# Patient Record
Sex: Female | Born: 1935 | Race: White | Hispanic: No | Marital: Married | Smoking: Former smoker
Health system: Southern US, Community
[De-identification: ages and names within clinical notes are randomized; demographics above are authoritative.]

## PROBLEM LIST (undated history)

## (undated) DIAGNOSIS — I4891 Unspecified atrial fibrillation: Secondary | ICD-10-CM

## (undated) DIAGNOSIS — M109 Gout, unspecified: Secondary | ICD-10-CM

## (undated) DIAGNOSIS — I5032 Chronic diastolic (congestive) heart failure: Secondary | ICD-10-CM

## (undated) DIAGNOSIS — I050 Rheumatic mitral stenosis: Secondary | ICD-10-CM

## (undated) DIAGNOSIS — I10 Essential (primary) hypertension: Secondary | ICD-10-CM

## (undated) DIAGNOSIS — R911 Solitary pulmonary nodule: Secondary | ICD-10-CM

## (undated) DIAGNOSIS — J90 Pleural effusion, not elsewhere classified: Secondary | ICD-10-CM

## (undated) DIAGNOSIS — E785 Hyperlipidemia, unspecified: Secondary | ICD-10-CM

## (undated) DIAGNOSIS — J449 Chronic obstructive pulmonary disease, unspecified: Secondary | ICD-10-CM

## (undated) DIAGNOSIS — J189 Pneumonia, unspecified organism: Secondary | ICD-10-CM

## (undated) HISTORY — PX: ABDOMINAL HYSTERECTOMY: SHX81

## (undated) HISTORY — PX: APPENDECTOMY: SHX54

## (undated) HISTORY — DX: Unspecified atrial fibrillation: I48.91

## (undated) HISTORY — DX: Pleural effusion, not elsewhere classified: J90

## (undated) HISTORY — DX: Chronic diastolic (congestive) heart failure: I50.32

## (undated) HISTORY — PX: CHOLECYSTECTOMY: SHX55

## (undated) HISTORY — DX: Essential (primary) hypertension: I10

## (undated) HISTORY — DX: Rheumatic mitral stenosis: I05.0

## (undated) HISTORY — DX: Solitary pulmonary nodule: R91.1

---

## 2002-08-14 ENCOUNTER — Inpatient Hospital Stay (HOSPITAL_COMMUNITY): Admission: RE | Admit: 2002-08-14 | Discharge: 2002-08-15 | Payer: Self-pay | Admitting: Cardiology

## 2002-08-15 ENCOUNTER — Encounter: Payer: Self-pay | Admitting: Cardiology

## 2003-02-28 ENCOUNTER — Inpatient Hospital Stay (HOSPITAL_COMMUNITY): Admission: RE | Admit: 2003-02-28 | Discharge: 2003-03-03 | Payer: Self-pay | Admitting: *Deleted

## 2003-02-28 ENCOUNTER — Encounter (INDEPENDENT_AMBULATORY_CARE_PROVIDER_SITE_OTHER): Payer: Self-pay

## 2003-02-28 ENCOUNTER — Encounter: Payer: Self-pay | Admitting: *Deleted

## 2004-03-12 ENCOUNTER — Ambulatory Visit (HOSPITAL_COMMUNITY): Admission: RE | Admit: 2004-03-12 | Discharge: 2004-03-12 | Payer: Self-pay | Admitting: Occupational Therapy

## 2005-09-03 ENCOUNTER — Emergency Department (HOSPITAL_COMMUNITY): Admission: EM | Admit: 2005-09-03 | Discharge: 2005-09-03 | Payer: Self-pay | Admitting: Emergency Medicine

## 2006-11-11 ENCOUNTER — Ambulatory Visit (HOSPITAL_COMMUNITY): Admission: RE | Admit: 2006-11-11 | Discharge: 2006-11-11 | Payer: Self-pay | Admitting: Nurse Practitioner

## 2008-02-07 ENCOUNTER — Ambulatory Visit (HOSPITAL_COMMUNITY): Admission: RE | Admit: 2008-02-07 | Discharge: 2008-02-07 | Payer: Self-pay | Admitting: Nurse Practitioner

## 2008-10-19 ENCOUNTER — Emergency Department (HOSPITAL_COMMUNITY): Admission: EM | Admit: 2008-10-19 | Discharge: 2008-10-19 | Payer: Self-pay | Admitting: Emergency Medicine

## 2009-03-15 ENCOUNTER — Inpatient Hospital Stay (HOSPITAL_COMMUNITY): Admission: EM | Admit: 2009-03-15 | Discharge: 2009-03-18 | Payer: Self-pay | Admitting: Emergency Medicine

## 2009-06-10 ENCOUNTER — Ambulatory Visit (HOSPITAL_COMMUNITY): Admission: RE | Admit: 2009-06-10 | Discharge: 2009-06-10 | Payer: Self-pay | Admitting: Nurse Practitioner

## 2010-08-12 ENCOUNTER — Ambulatory Visit (HOSPITAL_COMMUNITY): Admission: RE | Admit: 2010-08-12 | Discharge: 2010-08-12 | Payer: Self-pay | Admitting: Nurse Practitioner

## 2010-08-22 ENCOUNTER — Encounter: Admission: RE | Admit: 2010-08-22 | Discharge: 2010-08-22 | Payer: Self-pay | Admitting: Nurse Practitioner

## 2010-09-05 ENCOUNTER — Ambulatory Visit (HOSPITAL_COMMUNITY): Admission: RE | Admit: 2010-09-05 | Discharge: 2010-09-05 | Payer: Self-pay | Admitting: Occupational Therapy

## 2010-11-29 ENCOUNTER — Encounter: Payer: Self-pay | Admitting: Nurse Practitioner

## 2011-02-09 ENCOUNTER — Other Ambulatory Visit (HOSPITAL_COMMUNITY): Payer: Self-pay | Admitting: Nurse Practitioner

## 2011-02-09 DIAGNOSIS — Z09 Encounter for follow-up examination after completed treatment for conditions other than malignant neoplasm: Secondary | ICD-10-CM

## 2011-02-17 LAB — URINE MICROSCOPIC-ADD ON

## 2011-02-17 LAB — DIFFERENTIAL
Basophils Absolute: 0 10*3/uL (ref 0.0–0.1)
Basophils Relative: 0 % (ref 0–1)
Eosinophils Absolute: 0 10*3/uL (ref 0.0–0.7)
Eosinophils Absolute: 0.1 10*3/uL (ref 0.0–0.7)
Eosinophils Relative: 0 % (ref 0–5)
Eosinophils Relative: 1 % (ref 0–5)
Lymphocytes Relative: 15 % (ref 12–46)
Lymphocytes Relative: 26 % (ref 12–46)
Lymphs Abs: 1.2 10*3/uL (ref 0.7–4.0)
Lymphs Abs: 1.3 10*3/uL (ref 0.7–4.0)
Lymphs Abs: 1.7 10*3/uL (ref 0.7–4.0)
Lymphs Abs: 2 10*3/uL (ref 0.7–4.0)
Monocytes Absolute: 0.4 10*3/uL (ref 0.1–1.0)
Monocytes Absolute: 0.4 10*3/uL (ref 0.1–1.0)
Monocytes Absolute: 0.5 10*3/uL (ref 0.1–1.0)
Monocytes Absolute: 0.6 10*3/uL (ref 0.1–1.0)
Monocytes Relative: 4 % (ref 3–12)
Monocytes Relative: 5 % (ref 3–12)
Monocytes Relative: 6 % (ref 3–12)
Neutro Abs: 6.3 10*3/uL (ref 1.7–7.7)
Neutro Abs: 6.6 10*3/uL (ref 1.7–7.7)
Neutrophils Relative %: 71 % (ref 43–77)
Neutrophils Relative %: 80 % — ABNORMAL HIGH (ref 43–77)
Neutrophils Relative %: 86 % — ABNORMAL HIGH (ref 43–77)

## 2011-02-17 LAB — URINALYSIS, ROUTINE W REFLEX MICROSCOPIC
Bilirubin Urine: NEGATIVE
Glucose, UA: NEGATIVE mg/dL
Hgb urine dipstick: NEGATIVE
Ketones, ur: NEGATIVE mg/dL
Protein, ur: 100 mg/dL — AB
Urobilinogen, UA: 0.2 mg/dL (ref 0.0–1.0)

## 2011-02-17 LAB — COMPREHENSIVE METABOLIC PANEL
ALT: 129 U/L — ABNORMAL HIGH (ref 0–35)
Albumin: 2.4 g/dL — ABNORMAL LOW (ref 3.5–5.2)
Albumin: 2.5 g/dL — ABNORMAL LOW (ref 3.5–5.2)
BUN: 43 mg/dL — ABNORMAL HIGH (ref 6–23)
BUN: 45 mg/dL — ABNORMAL HIGH (ref 6–23)
Calcium: 8.4 mg/dL (ref 8.4–10.5)
Calcium: 8.6 mg/dL (ref 8.4–10.5)
Creatinine, Ser: 1.38 mg/dL — ABNORMAL HIGH (ref 0.4–1.2)
Glucose, Bld: 105 mg/dL — ABNORMAL HIGH (ref 70–99)
Potassium: 4.1 mEq/L (ref 3.5–5.1)
Potassium: 4.2 mEq/L (ref 3.5–5.1)
Sodium: 136 mEq/L (ref 135–145)
Total Protein: 5.5 g/dL — ABNORMAL LOW (ref 6.0–8.3)
Total Protein: 5.8 g/dL — ABNORMAL LOW (ref 6.0–8.3)

## 2011-02-17 LAB — CBC
HCT: 38.5 % (ref 36.0–46.0)
HCT: 41.6 % (ref 36.0–46.0)
Hemoglobin: 13.5 g/dL (ref 12.0–15.0)
Hemoglobin: 13.6 g/dL (ref 12.0–15.0)
MCHC: 34.3 g/dL (ref 30.0–36.0)
MCHC: 35.2 g/dL (ref 30.0–36.0)
MCV: 94.7 fL (ref 78.0–100.0)
MCV: 95.3 fL (ref 78.0–100.0)
Platelets: 123 10*3/uL — ABNORMAL LOW (ref 150–400)
Platelets: 149 10*3/uL — ABNORMAL LOW (ref 150–400)
Platelets: 173 10*3/uL (ref 150–400)
RBC: 3.79 MIL/uL — ABNORMAL LOW (ref 3.87–5.11)
RBC: 4.39 MIL/uL (ref 3.87–5.11)
RDW: 13.5 % (ref 11.5–15.5)
RDW: 13.6 % (ref 11.5–15.5)
WBC: 12.9 10*3/uL — ABNORMAL HIGH (ref 4.0–10.5)
WBC: 8.2 10*3/uL (ref 4.0–10.5)

## 2011-02-17 LAB — PHOSPHORUS: Phosphorus: 3.3 mg/dL (ref 2.3–4.6)

## 2011-02-17 LAB — BASIC METABOLIC PANEL
BUN: 32 mg/dL — ABNORMAL HIGH (ref 6–23)
CO2: 24 mEq/L (ref 19–32)
Chloride: 101 mEq/L (ref 96–112)
Chloride: 106 mEq/L (ref 96–112)
Creatinine, Ser: 1.27 mg/dL — ABNORMAL HIGH (ref 0.4–1.2)
GFR calc Af Amer: 50 mL/min — ABNORMAL LOW (ref >60)
Glucose, Bld: 108 mg/dL — ABNORMAL HIGH (ref 70–99)
Potassium: 4 mEq/L (ref 3.5–5.1)
Potassium: 4.1 mEq/L (ref 3.5–5.1)
Sodium: 138 mEq/L (ref 135–145)

## 2011-02-17 LAB — PROTIME-INR: Prothrombin Time: 14.8 seconds (ref 11.6–15.2)

## 2011-02-17 LAB — URINE CULTURE
Colony Count: NO GROWTH
Culture: NO GROWTH
Special Requests: NEGATIVE

## 2011-02-17 LAB — APTT: aPTT: 32 seconds (ref 24–37)

## 2011-02-25 ENCOUNTER — Ambulatory Visit (HOSPITAL_COMMUNITY)
Admission: RE | Admit: 2011-02-25 | Discharge: 2011-02-25 | Disposition: A | Discharge status: Home / Self Care | Payer: Medicare FFS | Source: Ambulatory Visit | Attending: Nurse Practitioner | Admitting: Nurse Practitioner

## 2011-02-25 ENCOUNTER — Ambulatory Visit (HOSPITAL_COMMUNITY): Payer: Medicare FFS

## 2011-02-25 DIAGNOSIS — Z09 Encounter for follow-up examination after completed treatment for conditions other than malignant neoplasm: Secondary | ICD-10-CM

## 2011-02-25 DIAGNOSIS — R928 Other abnormal and inconclusive findings on diagnostic imaging of breast: Secondary | ICD-10-CM | POA: Insufficient documentation

## 2011-03-24 NOTE — Group Therapy Note (Signed)
NAME:  Paula Fletcher, Paula Fletcher NO.:  [id number]   MEDICAL RECORD NO.:  [id number]          PATIENT TYPE:  INP   LOCATION:  A338                          FACILITY:  [facility]   PHYSICIAN:  Dorris Singh, DO    DATE OF BIRTH:  08-10-1935   DATE OF PROCEDURE:  03/17/2009  DATE OF DISCHARGE:                                 PROGRESS NOTE   SUBJECTIVE:  I saw the patient today in the room with family members,  looking at her arm.  It looks unchanged.  Makes me really suspect some  type of solar dermatitis.  She did mention that she had been working out  in [employer] and that she is right-hand dominant. I will go ahead  and continue antibiotics for a total of three days.  Will also start her  on some steroids and some cold compresses to the limb, to see if that  helps.  I talked to the family too about getting Dr. Jorja Loa  involved with her new renal failure that was not listed from 2004.  Will  go ahead and get a renal ultrasound as well, to see what is going on.   PHYSICAL EXAMINATION:  VITAL SIGNS:  Her temperature is 98.2, pulse 64,  respirations 18, blood pressure 123/54.  GENERAL:  She is well-  developed, well-nourished  and in no acute distress.  HEART:  Regular rate and rhythm.  LUNGS:  Clear to auscultation bilaterally.  ABDOMEN:  Soft, nontender, nondistended.  EXTREMITIES:  Her right hand has a thickened red, tender to touch rash,  on the exterior forearm with a small cut near that area as well.   IMPRESSION/PLAN:  1. Cellulitis, versus solar dermatitis:  Will go ahead and gives      steroids and continue the antibiotic therapy, to see if this helps,      as well as cold compresses.  2. Renal failure, unsure if this is acute on chronic, or acute renal      failure:  Will continue with IV hydration.  She is responding      slowly, so it makes me wonder if there is some element of some      chronic failure.  Also her creatinine has gone up since she  has      been hospitalized as well, so will have Dr. Kristian Covey evaluate this.   The plan is to have the patient discharged in the next one or two days.      Dorris Singh, DO  Electronically Signed     CB/MEDQ  D:  03/17/2009  T:  03/17/2009  Job:  [id number]

## 2011-03-24 NOTE — Discharge Summary (Signed)
Paula Fletcher, Paula Fletcher NO.:  [id number]   MEDICAL RECORD NO.:  [id number]          PATIENT TYPE:  INP   LOCATION:  A338                          FACILITY:  [facility]   PHYSICIAN:  Lonia Blood, M.D.      DATE OF BIRTH:  1935-07-17   DATE OF ADMISSION:  03/15/2009  DATE OF DISCHARGE:  05/10/2010LH                               DISCHARGE SUMMARY   PRIMARY CARE PHYSICIAN:  [facility].   DISCHARGE DIAGNOSES:  1. Cellulitis, right forearm.  2. Increased LFTs transiently probably due to statins.  3. Transient thrombocytopenia.  4. Dyslipidemia.  5. Hypertension.  6. Coronary artery disease.  7. Acute renal failure now resolved.   DISCHARGE MEDICATIONS:  1. Crestor 20 mg daily.  2. Nifedipine 90 mg daily.  3. Metoprolol 50 mg twice a day.  4. Darvocet-N 100 q.6 h. as needed.  5. Lasix 20 mg p.o. daily.  6. Benicar HCT 40/25 1 tablet daily.  7. The patient will be on antibiotics, to be specific, doxycycline 100      mg p.o. b.i.d.   DISPOSITION:  The patient is doing much better today.  She has had some  issues with increased renal function but that has completely resolved.  Her lesions are likely to be a combination of both tick bite and  cellulitis plus possibly some sunburn.   PROCEDURES PERFORMED:  Renal ultrasound performed Mar 16, 2009 that  showed duplicated left renal collecting system but no evidence for  hydronephrosis.  Otherwise no acute findings.   CONSULTATIONS:  None.   BRIEF HISTORY AND PHYSICAL:  Please refer to dictated history and  physical on admission by Dr. Dorris Singh.  In short, however, this  is a 76 year old female with known history of dyslipidemia and  hypertension that came in with right arm pain, swelling and redness.  She also had some nausea and vomiting, tick bite and headache.  The  patient was seen and evaluated and admitted for further management.   HOSPITAL COURSE:  1. Cellulitis.  With history of tick  bite, the patient was on IV      antibiotics.  She had some daily dressings and some wound care.  It      looks like her lesions were a little bit  resistant, not resolving      quickly enough.  At this point, however, she is improved.  No      fever, no white count, so I will discharge her on antibiotics.      especially in the setting of tick bite.  2. Acute renal failure.  Her BUN and creatinine were elevated on      admission.  Creatinine was 1.78.  It looks like this is prerenal.      Renal ultrasound was fine and after hydration today her creatinine      is 0.99.  3. Dyslipidemia.  The patient has been on Crestor and continued on her      Crestor in the hospital.  4. Mild increase in LFTs.  This probably is coming from her use of  statin.  5. Hypertension.  Blood pressure was well-controlled on her home      regimen.  6. Coronary artery disease.  Again, this is stable and no problems      seen.  Other than that, the patient is stable and we are ready to      discharge her.      Lonia Blood, M.D.  Electronically Signed     LG/MEDQ  D:  03/18/2009  T:  03/18/2009  Job:  [id number]

## 2011-03-24 NOTE — H&P (Signed)
NAME:  Paula Fletcher, Paula Fletcher NO.:  [id number]   MEDICAL RECORD NO.:  [id number]          PATIENT TYPE:  INP   LOCATION:  A338                          FACILITY:  [facility]   PHYSICIAN:  Dorris Singh, DO    DATE OF BIRTH:  22-Sep-1935   DATE OF ADMISSION:  03/15/2009  DATE OF DISCHARGE:  LH                              HISTORY & PHYSICAL   The patient is a 76 year old Caucasian female who presented to the [facility] Emergency Room after her daughter noticed the patient had a rash on  her arm.  The patient has been complaining of several symptoms that  include headache as well and the patient also admits to being bit by a  tick about 2 weeks ago and she pulled it off.  The rash started an  unknown time ago.  The daughter has  questioned the mom who said she  never noticed it.  However, the arm is tender to the touch.  At that  point in time due to her possible exposure to Lyme disease and the  cellulitis of her arm, it was determined that she should be admitted at  least for one or two days to receive IV antibiotics.   PAST MEDICAL HISTORY:  She has a history of hypertension,  hyperlipidemia, CAD.   SOCIAL HISTORY:  She is nonsmoker, nondrinker.   ALLERGIES:  She has no known drug allergies.   CURRENT LIST OF MEDICATIONS INCLUDE:  1. Crestor 20 mg once a day.  2. Nifedipine 90 mg once a day.  3. Metoprolol tartrate 50 mg twice a day.  4. Darvocet 100 mg /650 as needed.  5. Lasix 20 mg once a day.  6. Benicar 40/25 once a day.   REVIEW OF SYSTEMS:  CONSTITUTIONAL:  Negative for fever and chills.  EYES:  Negative for eye pain.  EARS, NOSE, MOUTH, THROAT:  Currently  negative for ear pain.  CARDIOVASCULAR:  Negative for chest pain.  RESPIRATORY:  Negative for cough, dyspnea or wheezing.  GASTROINTESTINAL:  Negative for  nausea, vomiting, diarrhea.  GU:  Negative for dysuria or flank pain.  MUSCULOSKELETAL:  Negative for  arthralgias, back pain or neck pain.  SKIN:   Positive for rash and  redness.  NEURO:  Positive for headache.   PHYSICAL EXAMINATION:  CURRENT VITAL SIGNS:  Temperature 99, pulse 70,  respirations 14, blood pressure 94/45.  GENERAL:  The patient is well-developed, well-nourished 76 year old  Caucasian female who is in no acute distress.  Head is normocephalic, atraumatic.  Eyes are PERLA and EOMI.  Positive  glasses.  Ears, nose, mouth and throat are within normal limits.  Teeth  are in fair repair.  NECK:  Supple.  No stool.  There is no lymphadenopathy.  HEART:  Regular rate and rhythm.  LUNGS:  Clear to auscultation bilaterally.  ABDOMEN:  Soft, nontender, nontender.  EXTREMITIES:  Positive pulses.  No edema, ecchymosis or cyanosis.   CURRENT LABORATORY DATA:  White count 12.9, hemoglobin 14.6, hematocrit  41.6, platelet count 152.  Her sodium is 137, potassium 4.0, chloride  101, CO2  24, glucose 135, BUN 29,  creatinine 1.27.  Her UA shows a few  bacteria with some casts and small amount of leukocytes with some  protein.  The patient was last admitted to this hospital last seen in  December 2009 when she was here for an open fracture. We will go ahead  and get an ultrasound. I am  not sure of this renal function is new or  not.  This to a decrease in her renal function.   ASSESSMENT/PLAN:  1. Cellulitis the right forearm.  2. Nausea and vomiting.  3. Tick bite.  4. Renal insufficiency.  5. Headache.   She will be admitted to  [facility] hospitalist Team P.  Will  do  blood work in the morning, do IV hydration, get a renal ultrasound.  Get  a bedside commode.  Put her on a heart healthy diet.  Do DVT and GI  prophylaxis.  Will keep her on doxycycline 100 mg p.o. b.i.d. and the  Rocephin 1000 mg IV q. 24.  She is having some hypotension at the  moment.  We will withhold any of her blood pressure medications for  blood pressure less than 100.  We will continue to monitor her and make  any changes as necessary.  I  anticipate discharge planning within the  next 1-2 days. If  patient  is improved,  I suspect she can be  discharged by Sunday.      Dorris Singh, DO  Electronically Signed     CB/MEDQ  D:  03/15/2009  T:  03/15/2009  Job:  [id number]

## 2011-03-27 NOTE — Discharge Summary (Signed)
NAME:  Paula Fletcher, Paula Fletcher                   ACCOUNT NO.:  [id number]   MEDICAL RECORD NO.:  [id number]                   PATIENT TYPE:  INP   LOCATION:  2017                                 FACILITY:  [facility]   PHYSICIAN:  Sausal Bing, MD [facility]             DATE OF BIRTH:  04/19/1935   DATE OF ADMISSION:  08/14/2002  DATE OF DISCHARGE:  08/15/2002                           DISCHARGE SUMMARY - REFERRING   PROCEDURE:  1. Carotid Dopplers.  2. Adenosine Cardiolite.  3. A 2-D echocardiogram.   HOSPITAL COURSE:  The patient is a 76 year old female with no known history  of coronary artery disease.  She was seen in the office on August 14, 2002  for mid sternal chest pain that radiated into her back and was associated  with diaphoresis and nausea.  It lasted greater than one hour.  There was  concern for an anginal component to her symptoms so she was admitted to the  hospital to rule out MI and for further evaluation.   Her enzymes were negative for MI and she had a Cardiolite.  A stress  Cardiolite was ordered but on the treadmill she had significant lower  extremity pain and was unable to continue.  The test was aborted because her  heart rate was 108 and her goal heart rate was 131.  She had no chest pain  and the shortness of breath was not the limiting factor.  An Adenosine  Cardiolite was substituted.  With the Adenosine Cardiolite she had a few  PVCs and some nonconducted PACs, but no significant heart block and a flush  feeling, but no chest pain or shortness of breath.  The Cardiolite results  showed an EF of 86% with no wall motion abnormalities and no evidence of  scar or ischemia.  No further ischemic work-up is indicated at this time.   The patient had an elevated EF of 86% by Cardiolite and her echocardiogram  additionally showed an elevated EF at 80%.  There was concern for  hypertrophic obstructive cardiomyopathy but this diagnosis was not made at  the time.  Her  medications were changed in that she was taken off Procardia  and placed on Lopressor.  It was felt that this was a better medication for  her.  In the future she may need to be tapered off diuretics with other  medications for blood pressure instead.  No further medication changes were  felt necessary at this time.  Office follow-up is to be within the next two  weeks.   Because of the type and quality of her symptoms, there was a concern for a  GI component.  A GI consult was called and the patient was seen by Dr.  Leone Payor.  It was felt that she would, indeed, benefit from a trial of a pro  time pump inhibitor and outpatient follow-up with him.  He did not feel that  an EGD was indicated at this  time.  He did, however, recommend a screening  colonoscopy as an outpatient with a family history of probable colon cancer.  A lipase level was ordered, but is pending at the time of dictation.  No  further inpatient work-up was recommended.   With the resolution of her chest pain and with Cardiolite negative for  ischemia or scar, she was considered stable for discharge on August 15, 2002.   LABORATORIES:  Total cholesterol 167, triglycerides 209, HDL 42, LDL 83.  TSH 1.436.  Serial CK-MB and troponin I negative for MI.  Sodium 135,  potassium 4.3, chloride 99, CO2 28, BUN 22, creatinine 0.9, glucose 100.  Other CMET values within normal limits.  Hemoglobin 15.4, hematocrit 43.7,  WBC 5.3, platelets 199,000.  INR 1.0 with PTT 27.   Chest x-ray:  Mild chronic obstructive pulmonary disease with fibrosis, but  no active findings.   The 2-D echocardiogram:  Left ventricular EF was estimated 80% with no  diagnostic evidence of left ventricular regional wall motion abnormalities.  There was mild mitral annular calcification and there was vigorous left  ventricular motion.  There is a small mid cavitary gradient document.  I do  not see any systolic anterior motion of the mitral valve.    CONDITION ON DISCHARGE:  Stable.   DISCHARGE DIAGNOSES:  1. Chest pain, negative myocardial infarction by enzymes and no scar or     ischemia by Cardiolite with vigorous ejection fraction.  2. Possible gastroesophageal reflux disease symptoms.  Outpatient follow-up     recommended.  3. Lower extremity pain with ambulation.  Follow up with primary care     physician.  4. Hypertension.  5. Atherosclerotic peripheral vascular disease with 40-60% internal carotid     artery stenosis bilaterally with retrograde vertebral arterial flow.  6. History of tobacco use.  7. Status post cholecystectomy, breast cyst removal, and total vaginal     hysterectomy.  8. History of Raynaud's disease.  9. Hyperlipidemia.  10.      History of hiatal hernia.  11.      Chronic obstructive pulmonary disease by chest x-ray.  12.      Elevated ejection fraction by echocardiogram with small     intracavitary gradient.   DISCHARGE INSTRUCTIONS:  Her activity level is to be as tolerated.  She is  to stick to a low fat diet.  She is not to use tobacco.  She is to follow up  with [facility].  She is to follow up with Dr. Leone Payor.  She is to follow up with Dr. Dietrich Pates and call for an appointment.   DISCHARGE MEDICATIONS:  1. __________  b.i.d.  2. Avacor 1000/20 q.d.  3. Tussionex p.r.n.  4. Enalapril/HCT 10/25 q.d.  5. HCTZ 25 mg q.d.  6. Albuterol MDI b.i.d.  7.     Lopressor 25 mg p.o. b.i.d.  8. Aspirin 325 mg q.d.  9. Prilosec 20 mg q.d. or b.i.d. p.r.n.     Lavella Hammock, P.A. [facility]                  Martin Bing, MD [facility]    RG/MEDQ  D:  08/15/2002  T:  08/16/2002  Job:  [id number]   cc:   Iva Boop, M.D. [facility]  [facility]  [address] [place], [place] [address]  Fax: 1   La Carla Bing, M.D. [facility]  [address]  [place]  [place] [address]  Fax: 1  [facility]

## 2011-03-27 NOTE — H&P (Signed)
NAME:  Paula Fletcher, Paula Fletcher                         ACCOUNT NO.:  [id number]   MEDICAL RECORD NO.:  [id number]                   PATIENT TYPE:  INP   LOCATION:  NA                                   FACILITY:  [facility]   PHYSICIAN:  Balinda Quails, M.D.                 DATE OF BIRTH:  1934/12/20   DATE OF ADMISSION:  02/28/2003  DATE OF DISCHARGE:                                HISTORY & PHYSICAL   CHIEF COMPLAINT:  Left leg pain.   HISTORY OF PRESENT ILLNESS:  The patient is a 76 year old white female with  a one year history of lower extremity claudication symptoms.  Specifically  she has been having mostly left leg pain which occurs with exertion.  She  states she only has to walk short distances before she develops cramping in  her left calf.  She also reports left buttock pain and hip pain as well.  She has cramps in both feet which awaken her from sleep at night as well as  numbness and tingling in both lower extremities.  She has also noticed that  her left foot is cooler than the right.  She denies any nonhealing ulcers.  Her symptoms are progressively worsening, therefore she saw Dr. Pablo Ledger in [place] for further evaluation.  Ankle brachial indexes  performed in his office on the left were 0.31 at the toe and 0.74 at the  calf.  Arteriography was also performed which showed occlusion of the left  common iliac artery.  The left external iliac filled via internal iliac  collaterals.  The right common and external iliacs were patent and both  right and left SFA's were widely patent.  It was felt that she would require  surgical revascularization, therefore she was referred to Dr. Liliane Bade for  evaluation.  Because of her symptoms and her significant peripheral vascular  disease it was felt that she would benefit from a right to left femoral to  femoral bypass.  The risks, benefits and alternatives were explained to the  patient and she has agreed to proceed at this  time.   PAST MEDICAL HISTORY:  1. Peripheral vascular disease.  2. Hypertension.  3. Hyperlipidemia.  4. She reports a questionable history of ovarian cancer for which she had a     hysterectomy.  It is unclear whether this was indeed the reason for her     surgery and she has received no further treatment or follow up for this     problem.  5. Patient also has a questionable left subclavian stenosis which she states     is inoperable.  Again, I can find nothing to confirm this in her previous     records.   PAST SURGICAL HISTORY:  1. Cholecystectomy 25 years ago.  2. Bilateral breast cysts removed, all of which were benign.  3. Vaginal hysterectomy.  4. Appendectomy.  CURRENT MEDICATIONS:  1. Nifedipine 30 mg daily.  2. Hydrochlorothiazide 25 mg daily.  3. Enalapril/hydrochlorothiazide 10/25 mg daily.  4. Metoprolol 25 mg b.i.d.  5. Advicor 1000/20 daily.  6. Enteric coated aspirin 325 mg daily.   ALLERGIES:  No known drug allergies.   FAMILY HISTORY:  Her mother died at age 72 of coronary artery disease.  Her  father died at age 55 of cancer of unknown type.  He also had coronary  artery disease.  She denies family history of hypertension, diabetes  mellitus, chronic obstructive pulmonary disease, cerebrovascular accident or  peripheral vascular disease.   SOCIAL HISTORY:  The patient is married and has two children.  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  She denies alcohol use.  She has smoked at least one pack of  cigarettes per day, if not more, for at least 20 years.   PHYSICAL EXAMINATION:  VITAL SIGNS:  Blood pressure 138/62 on right, pulse  84 and regular, respirations 18 and unlabored.  GENERAL:  This is a well-developed, well-nourished white female in no acute  distress.  HEENT:  Normocephalic, atraumatic.  Pupils equal, round, reactive to light  and accommodation.  Extraocular movements intact.   Tympanic membranes and  canals are clear. Nares patent bilaterally.  Oropharynx is clear with upper  and lower dentures in place.  NECK:  Supple without lymphadenopathy or thyromegaly.  There are soft  bilateral carotid bruits.  LUNGS:  Clear to auscultation.  HEART:  Regular rate and rhythm without murmurs, rubs or gallops.  ABDOMEN:  Soft, nontender, nondistended with active bowel sounds in all  quadrants.  No masses or hepatosplenomegaly.  EXTREMITIES:  No cyanosis, clubbing or edema.  Her feet are cool to touch.  She has a 2+ palpable femoral pulse on the right with no palpable pulse in  the femoral position on the left.  Her dorsalis pedis pulse and posterior  tibial pulses are 1+ on the right and absent but Dopplerable on the left.  NEUROLOGICAL:  Cranial nerves II-XII grossly intact. She is alert and  oriented X3.  Gait is steady and only limited by discomfort.   ASSESSMENT/PLAN:  This is a 76 year old white female with history of  peripheral vascular disease who will be admitted to [facility] on February 28, 2003 for a right to left femoral to femoral bypass  graft performed by Dr. Liliane Bade.     Coral Ceo, P.A.                        Balinda Quails, M.D.    GC/MEDQ  D:  02/26/2003  T:  02/26/2003  Job:  [id number]   cc:   Jarold Motto, M.D.  [facility]   Pablo Ledger, M.D.  [facility]

## 2011-03-27 NOTE — Op Note (Signed)
NAME:  COZETTE, BRAGGS                         ACCOUNT NO.:  [id number]   MEDICAL RECORD NO.:  [id number]                   PATIENT TYPE:  INP   LOCATION:  3314                                 FACILITY:  [facility]   PHYSICIAN:  Balinda Quails, M.D.                 DATE OF BIRTH:  Jan 04, 1935   DATE OF PROCEDURE:  02/28/2003  DATE OF DISCHARGE:                                 OPERATIVE REPORT   PREOPERATIVE DIAGNOSIS:  Left lower extremity claudication.   POSTOPERATIVE DIAGNOSIS:  Left lower extremity claudication.   OPERATION:  1. Right to left femoral femoral bypass with 7 mm Dacron Hemashield graft.  2. Right common femoral endarterectomy.  3. Patch angioplasty right superficial femoral artery.   SURGEON:  Balinda Quails, M.D.   ASSISTANT:  Quita Skye. Hart Rochester, M.D./Willard D. Marlyne Beards, P.A.   ANESTHESIA:  General endotracheal.   CLINICAL NOTE:  The patient is a 76 year old obese female with history of  leg pain with ambulation.  She was referred by Dr. Park Liter from  [facility] with an arteriogram revealing left iliac occlusion.  She was seen  in consultation and it was recommended she undergo right to left femoral  femoral bypass for release of claudication symptoms.  She consented for  surgery.  The risks of this operative procedure including the potential  risks of bleeding, infection, MRI, limb loss and death were discussed.   DESCRIPTION OF PROCEDURE:  The patient was brought to the operating room in  stable condition.  Placed in the supine position.  General endotracheal  anesthesia induced.  Both legs were prepped and draped in a sterile fashion.  Bilateral oblique groin skin incisions were made at the inguinal crease.  Dissection was carried down through the subcutaneous tissue with  electrocautery.  Large veins were ligated with 2-0 and 3-0 silk and divided.  Common femoral artery was exposed bilaterally at the inguinal ligament and  encircled with a vessel loupe.   Distal dissection carried down to the origin  of the profunda and superficial femoral arteries which were each encircled  with vessel loupes.   The right common femoral artery revealed an excellent pulse.  There was;  however, plaque in the mid right common femoral artery extending out into  the origin of the profunda and superificial femoral arteries.  The left  common femoral artery revealed a very weak pulse.  The artery; however, was  generally soft and free of significant plague.   A suprapubic subcutaneous tunnel was made between the two incisions.  A 7 mm  Dacron graft was placed between the two incisions.   The patient administered 7000 units of heparin intravenously.  Adequate  circulation time permitted.  The right femoral vessel was controlled with  clamps.  A longitudinal arteriotomy made in the right common femoral artery.  The arteriotomy extended down into the origin of the right superficial  femoral  artery.  There was a large posterior plaque extending from the mid  common femoral artery down into the superficial femoral artery.  This was  dentatectomized.  Proximally the plague was divided transversely.  Distally  the plaque was feathered out at superficial femoral artery.  There appeared  to be a good end point distally.  The 7 mm Dacron graft was then anastomosed  end-to-side with right common femoral artery using running 6-0 Prolene  suture.  At the completion of this, the femoral vessels were flushed and the  graft controlled with a fistula clamp.   Attention then placed on the left groin.  The left femoral vessel was  controlled with clamps.  A longitudinal arteriotomy made in the distal left  common femoral artery.  The left limb of the graft was beveled and  anastomosed end to side to the left common femoral artery using running 6-0  Prolene suture.  The graft then flushed and clamped removed reperfusing the  left leg.   The patient received a test dose of  Protamine.  Following this, evaluation  of the right leg; however, revealed questionable flow in the proximal right  superficial femoral artery.  No further Protamine was administered and the  patient received an additional 3000 units of heparin.  The right superficial  femoral artery was then controlled proximally to distally with clamps.  A  longitudinal arteriotomy made.  There was a small intimal flap present.  This was resected and then distally the plaque was tacked down with  interrupted 7-0 Prolene suture.  A patch angioplasty of the proximal right  superficial femoral artery was carried out with a Hemashield patch using  running 6-0 Prolene suture.  Clamps were then removed and excellent flow  present through the right leg.   The patient was then administered an additional 50 mg of Protamine  intravenously.  The groin wound was irrigated with antibiotic solution.  Adequate hemostasis obtained.   Both incisions were then closed with two layers of running 2-0 Vicryl suture  for the subcutaneous tissue and staples applied to the skin.  Sterile  dressings were applied.   The patient was transferred to the recovery room in stable condition.                                               Balinda Quails, M.D.    PGH/MEDQ  D:  02/28/2003  T:  03/01/2003  Job:  [id number]   cc:   Park Liter, M.D.  [employer], [place].

## 2011-03-27 NOTE — Consult Note (Signed)
   NAME:  Paula Fletcher, Paula Fletcher                   ACCOUNT NO.:  [id number]   MEDICAL RECORD NO.:  [id number]                   PATIENT TYPE:  INP   LOCATION:  2017                                 FACILITY:  [facility]   PHYSICIAN:  Iva Boop, M.D. [facility]           DATE OF BIRTH:  [birth year]   DATE OF CONSULTATION:  08/15/2002  DATE OF DISCHARGE:                                   CONSULTATION   I have seen this patient in conjunction with Dr. Shary Decamp of the  medicine residency program at [facility]. [facility]  I have  personally taken history and performed physical examination and agree with  the notes that he has recorded in the chart.   ASSESSMENT:  1. Chest and epigastric pain that has responded to H2 blocker in the past.     At this point, cardiac work-up appears negative, laboratory studies are     unrevealing.  Question nonulcer dyspepsia versus peptic ulcer disease     versus gastroesophageal reflux disease.  The patient is asymptomatic at     the present time.  2. Possible family history of colon cancer. The patient has never had colon     cancer screening.   PLAN:  1. Trial of Prilosec 20 mg q.d. to b.i.d. over the next four weeks.  2. Return office visit with me in four weeks.  3. Will discuss the possibility of screening colonoscopy at that time and     try to clarify the colon cancer history at that time.                                               Iva Boop, M.D. [facility]    CEG/MEDQ  D:  08/15/2002  T:  08/17/2002  Job:  [id number]   cc:   [place] Bing, M.D. [facility]  [address]  [place]  [place] [address]  Fax: 1

## 2011-03-27 NOTE — H&P (Signed)
NAME:  Paula Fletcher, Paula Fletcher                   ACCOUNT NO.:  [id number]   MEDICAL RECORD NO.:  [id number]                   PATIENT TYPE:  INP   LOCATION:  2017                                 FACILITY:  [facility]   PHYSICIAN:  [place] Bing, MD [facility]             DATE OF BIRTH:  02/09/1935   DATE OF ADMISSION:  08/14/2002  DATE OF DISCHARGE:  08/15/2002                                HISTORY & PHYSICAL   REFERRED BY:  [facility].   HISTORY OF PRESENT ILLNESS:  This is a 76 year old woman with chest pain of  uncertain duration referred for evaluation of a severe episode last night.  The patient is somewhat of a vague historian.  She initially described a 1-  day history of chest pain -- her doctor says this has been going on for some  time.  She relates lower right chest discomfort that radiates through to her  back.  She has had some radiation down the right arm.  There is associated  dyspnea.  There is no diaphoresis.  She has had some nausea and near-emesis.  The onset last night was at rest, but her daughter indicates that episodes  frequently occur with exertion.  There is an uncertain relationship to  eating.  Last night, the patient describes the sudden onset of right-sided  discomfort that became severe with radiation through to the right back.  She  tried some ointments without much relief.  She eventually took aspirin and  noted marked eructation with gradual improvement.  She reports some mild  residual soreness at the right costal margin and in her right back.  There  was no localized tenderness.  There was no exacerbation of symptoms with  movement of the trunk.   The patient has multiple cardiovascular risk factors including a 45-pack-  year history of cigarette smoking, hypertension, and hyperlipidemia.  She  has been receiving a good medical regimen, which includes nifedipine 30 mg  q.d., Advicor 1 g/20 mg q.d., Tussionex, enalapril HCT 10/25 mg q.d.  and  additional 25 mg of hydrochlorothiazide q.d., albuterol by MDI, aspirin, and  Advil.   PAST MEDICAL HISTORY:  Past medical history is notable for a remote  cholecystectomy.  She has had excisional biopsies for benign disease of both  breasts.  She has had a total vaginal hysterectomy due to uterine cancer  many years ago.  There is a history of Raynaud's phenomenon.   ALLERGIES:  None known.   SOCIAL HISTORY:  No excessive use of alcohol; works in Audiological [employer].  Married  with 2 children who are alive and well.  Her husband is a patient of our  practice who has previously had a defibrillator implanted.   REVIEW OF SYSTEMS:  Notable for occasional dizziness, the need for  corrective lenses, upper and lower dentures, intermittent palpations,  chronic cough with a history of COPD, cerebrovascular disease -- a carotid  duplex study demonstrated atherosclerosis without focal  disease.  She has no  GI history other than her cholecystectomy.  She has nocturia once or twice  per night.   PHYSICAL EXAMINATION:  On exam, a well-appearing woman.  VITAL SIGNS:  Weight:  187.  Blood pressure 110/60 on the right and 80/palp  on the left -- blood pressure disparity has been described in the past.  Heart rate:  84 and regular.  Respirations:  16.  NECK:  No jugular venous distension; left carotid bruit.  ENDOCRINE:  No thyromegaly.  HEMATOPOIETIC:  No adenopathy.  SKIN:  No significant lesions.  HEENT:  Anicteric sclerae.  CHEST:  Clear to auscultation; resonance to percussion.  CARDIAC:  Normal first and second heart sounds; fourth heart sound present.  ABDOMEN:  Soft and nontender; no bruits; no organomegaly; right upper  quadrant surgical scar.  EXTREMITIES:  1+ distal pulses; trace edema.  NEUROMUSCULAR:  Symmetric strength and tone.   IMPRESSION:  This is a 76 year old woman with chest discomfort that may be  somewhat chronic, but with acute exacerbation within the past 12 hours.  By   history, this certainly could be of GI origin rather than cardiac, but it is  impossible to differentiate this in the office.  Accordingly, the patient is  being admitted to [facility] for further testing.  Serial cardiac  markers to be obtained.  She will be started on low-dose beta blocker with  nifedipine discontinued.  If initial testing is negative, a stress  Cardiolite study will be performed in the morning.  GI consultation to be  obtained.                                               Beaver Bing, MD [facility]    RR/MEDQ  D:  08/14/2002  T:  08/16/2002  Job:  [id number]

## 2011-03-27 NOTE — Discharge Summary (Signed)
NAME:  Paula Fletcher, Paula Fletcher                         ACCOUNT NO.:  [id number]   MEDICAL RECORD NO.:  [id number]                   PATIENT TYPE:  INP   LOCATION:  2008                                 FACILITY:  [facility]   PHYSICIAN:  Balinda Quails, M.D.                 DATE OF BIRTH:  07/04/1935   DATE OF ADMISSION:  02/28/2003  DATE OF DISCHARGE:  03/03/2003                                 DISCHARGE SUMMARY   DISCHARGE DIAGNOSES:  1. Left lower extremity claudication.  2. Occlusion of the left common iliac artery.   SECONDARY DIAGNOSES:  1. Hypertension.  2. Hyperlipidemia.  3. Questionable history of ovarian cancer, status post hysterectomy.  4. Status post cholecystectomy 25 years ago.  5. Bilateral breast cyst resections, all benign.  6. Status post appendectomy.   PROCEDURE:  1. February 28, 2003, right-to-left femoral-to-femoral bypass, Dr. Balinda Quails.  The patient tolerated the procedure well and was transferred     stable to recovery room, had good perfusion in left lower extremity     throughout her hospitalization.  2. March 01, 2003, ankle brachial indices, on the left 0.90, on the right     0.90.   DISCHARGE DISPOSITION:  The patient is ready for discharge, postoperative  day #3, after undergoing right-to-left femoral-to-femoral bypass.  She has  been afebrile in the postoperative period.  Her mental status has remained  fair.  Her incisions are healing nicely.  She is ambulatory independently.  Her pain is controlled with oral analgesics.  She has not required  consultation other than a smoking cessation consult; brochure was given and  the patient opts for cold-turkey cessation rather than either nicotine  substitute or Zyban.   DISCHARGE MEDICATIONS:  The patient will go home on the following  medications:  1. Oxycodone 5 mg one to two tabs every four to six hours p.r.n. pain.  2. Nifedipine 30 mg a day.  3. Hydrochlorothiazide 25 mg daily.  4.  Enalapril/hydrochlorothiazide 10/25 mg daily.  5. Metoprolol 25 mg p.o. b.i.d.  6. Advicor 20/1000 mg daily.  7. Enteric-coated aspirin daily.  8. Combivent metered-dose inhaler two puffs every six hours.   DISCHARGE ACTIVITY:  Discharge activity is to walk daily to keep up her  strength.  She is asked not to drive for two weeks.   DISCHARGE DIET:  Low-sodium, low-cholesterol diet.   WOUND CARE:  She may shower, but if this is not possible at her house, she  is to continue sponge-bathing, especially the groins every morning, pat dry,  place dry gauze over the incisions and tape; she will be given these  dressings on discharge.  She is to call Dr. Florina Ou office if the incision  becomes red or starts to break apart.   FOLLOWUP:  She will follow up with Dr. Madilyn Fireman, Monday, Mar 19, 2003, at 9:30  in the  morning.   BRIEF HISTORY:  The patient is a 76 year old female.  She has a one-year  history of lower extremity claudication symptoms.  Mostly, she is having  left leg pain which occurs with exertion.  She states that she only has to  walk short distances before she developed cramping in her left calf.  She  also has left buttock pain and hip pain as well.  She has cramps in both  feet which awaken her at night and tingling in both lower extremities.  She  notices that her left foot is cooler than the right one.  She denies any  nonhealing ulcers.  Her symptoms are progressively worsening and she saw Dr.  Park Liter for further evaluation.  Ankle brachial indices performed at  his office on the left were 0.31 at the toe and 0.74 at the calf.  Arteriography was performed and showed occlusion of the left common iliac  artery.  The right common iliac and external iliacs were both patent and  both right and left superficial femoral arteries were widely patent.  She  was seen by Dr. Denman George for evaluation for possible surgical  revascularization.  Risks, benefits and alternatives  were explained to the  patient and the patient has agreed to proceed with surgery.  She presents  February 28, 2003 for right-to-left femoral-to-femoral bypass.   HOSPITAL COURSE:  Her hospital course is as described in discharge  disposition.  After the surgery, she had good perfusion to the left foot  with excellent ankle brachial indices postoperatively.  She is ambulating.  Her pain is controlled with oxycodone.  She goes home March 03, 2003 with  followup as dictated.     Maple Mirza, P.A.                    Balinda Quails, M.D.    GM/MEDQ  D:  03/02/2003  T:  03/05/2003  Job:  [id number]   cc:   [facility] Dr. Sheppard Plumber, [place] Dr. Lorine Bears

## 2011-10-05 ENCOUNTER — Other Ambulatory Visit: Payer: Self-pay | Admitting: Occupational Therapy

## 2011-10-05 DIAGNOSIS — Z139 Encounter for screening, unspecified: Secondary | ICD-10-CM

## 2011-11-12 ENCOUNTER — Ambulatory Visit (HOSPITAL_COMMUNITY)
Admission: RE | Admit: 2011-11-12 | Discharge: 2011-11-12 | Disposition: A | Discharge status: Home / Self Care | Payer: Medicare FFS | Source: Ambulatory Visit | Attending: Nurse Practitioner | Admitting: Nurse Practitioner

## 2011-11-12 DIAGNOSIS — Z1231 Encounter for screening mammogram for malignant neoplasm of breast: Secondary | ICD-10-CM | POA: Insufficient documentation

## 2011-11-12 DIAGNOSIS — Z139 Encounter for screening, unspecified: Secondary | ICD-10-CM

## 2013-12-10 ENCOUNTER — Inpatient Hospital Stay (HOSPITAL_COMMUNITY)
Admission: EM | Admit: 2013-12-10 | Discharge: 2013-12-18 | DRG: 291 | Disposition: A | Discharge status: Home Health | Payer: Medicare FFS | Attending: Internal Medicine | Admitting: Internal Medicine

## 2013-12-10 ENCOUNTER — Emergency Department (HOSPITAL_COMMUNITY): Payer: Medicare FFS

## 2013-12-10 ENCOUNTER — Encounter (HOSPITAL_COMMUNITY): Payer: Self-pay | Admitting: Emergency Medicine

## 2013-12-10 DIAGNOSIS — I4891 Unspecified atrial fibrillation: Secondary | ICD-10-CM | POA: Diagnosis present

## 2013-12-10 DIAGNOSIS — J9601 Acute respiratory failure with hypoxia: Secondary | ICD-10-CM | POA: Diagnosis present

## 2013-12-10 DIAGNOSIS — I872 Venous insufficiency (chronic) (peripheral): Secondary | ICD-10-CM | POA: Diagnosis present

## 2013-12-10 DIAGNOSIS — T502X5A Adverse effect of carbonic-anhydrase inhibitors, benzothiadiazides and other diuretics, initial encounter: Secondary | ICD-10-CM | POA: Diagnosis present

## 2013-12-10 DIAGNOSIS — J4489 Other specified chronic obstructive pulmonary disease: Secondary | ICD-10-CM | POA: Diagnosis present

## 2013-12-10 DIAGNOSIS — Z6838 Body mass index (BMI) 38.0-38.9, adult: Secondary | ICD-10-CM

## 2013-12-10 DIAGNOSIS — I10 Essential (primary) hypertension: Secondary | ICD-10-CM | POA: Diagnosis present

## 2013-12-10 DIAGNOSIS — E785 Hyperlipidemia, unspecified: Secondary | ICD-10-CM | POA: Diagnosis present

## 2013-12-10 DIAGNOSIS — N289 Disorder of kidney and ureter, unspecified: Secondary | ICD-10-CM | POA: Diagnosis present

## 2013-12-10 DIAGNOSIS — N390 Urinary tract infection, site not specified: Secondary | ICD-10-CM | POA: Diagnosis present

## 2013-12-10 DIAGNOSIS — J9600 Acute respiratory failure, unspecified whether with hypoxia or hypercapnia: Secondary | ICD-10-CM | POA: Diagnosis present

## 2013-12-10 DIAGNOSIS — E662 Morbid (severe) obesity with alveolar hypoventilation: Secondary | ICD-10-CM | POA: Diagnosis present

## 2013-12-10 DIAGNOSIS — J189 Pneumonia, unspecified organism: Secondary | ICD-10-CM | POA: Diagnosis present

## 2013-12-10 DIAGNOSIS — D696 Thrombocytopenia, unspecified: Secondary | ICD-10-CM | POA: Diagnosis present

## 2013-12-10 DIAGNOSIS — I252 Old myocardial infarction: Secondary | ICD-10-CM

## 2013-12-10 DIAGNOSIS — Z87891 Personal history of nicotine dependence: Secondary | ICD-10-CM

## 2013-12-10 DIAGNOSIS — D7589 Other specified diseases of blood and blood-forming organs: Secondary | ICD-10-CM | POA: Diagnosis present

## 2013-12-10 DIAGNOSIS — Z8249 Family history of ischemic heart disease and other diseases of the circulatory system: Secondary | ICD-10-CM

## 2013-12-10 DIAGNOSIS — R911 Solitary pulmonary nodule: Secondary | ICD-10-CM | POA: Diagnosis present

## 2013-12-10 DIAGNOSIS — J90 Pleural effusion, not elsewhere classified: Secondary | ICD-10-CM | POA: Diagnosis present

## 2013-12-10 DIAGNOSIS — J449 Chronic obstructive pulmonary disease, unspecified: Secondary | ICD-10-CM

## 2013-12-10 DIAGNOSIS — I5032 Chronic diastolic (congestive) heart failure: Secondary | ICD-10-CM | POA: Diagnosis present

## 2013-12-10 DIAGNOSIS — I878 Other specified disorders of veins: Secondary | ICD-10-CM

## 2013-12-10 DIAGNOSIS — I509 Heart failure, unspecified: Secondary | ICD-10-CM | POA: Diagnosis present

## 2013-12-10 DIAGNOSIS — Z9981 Dependence on supplemental oxygen: Secondary | ICD-10-CM

## 2013-12-10 DIAGNOSIS — I5033 Acute on chronic diastolic (congestive) heart failure: Principal | ICD-10-CM | POA: Diagnosis present

## 2013-12-10 DIAGNOSIS — M109 Gout, unspecified: Secondary | ICD-10-CM | POA: Diagnosis present

## 2013-12-10 DIAGNOSIS — Z79899 Other long term (current) drug therapy: Secondary | ICD-10-CM

## 2013-12-10 HISTORY — DX: Pneumonia, unspecified organism: J18.9

## 2013-12-10 HISTORY — DX: Hyperlipidemia, unspecified: E78.5

## 2013-12-10 HISTORY — DX: Gout, unspecified: M10.9

## 2013-12-10 HISTORY — DX: Chronic obstructive pulmonary disease, unspecified: J44.9

## 2013-12-10 LAB — BLOOD GAS, ARTERIAL
ACID-BASE EXCESS: 6.4 mmol/L — AB (ref 0.0–2.0)
Acid-Base Excess: 6.5 mmol/L — ABNORMAL HIGH (ref 0.0–2.0)
BICARBONATE: 30.7 meq/L — AB (ref 20.0–24.0)
Bicarbonate: 31 mEq/L — ABNORMAL HIGH (ref 20.0–24.0)
DRAWN BY: 23534
Delivery systems: POSITIVE
Expiratory PAP: 5
FIO2: 0.5 %
INSPIRATORY PAP: 12
O2 Content: 4 L/min
O2 Content: 50 L/min
O2 SAT: 80.8 %
O2 Saturation: 88.7 %
PCO2 ART: 48.1 mmHg — AB (ref 35.0–45.0)
PH ART: 7.425 (ref 7.350–7.450)
Patient temperature: 37
Patient temperature: 37
TCO2: 26.5 mmol/L (ref 0–100)
TCO2: 26.8 mmol/L (ref 0–100)
pCO2 arterial: 47 mmHg — ABNORMAL HIGH (ref 35.0–45.0)
pH, Arterial: 7.43 (ref 7.350–7.450)
pO2, Arterial: 46 mmHg — ABNORMAL LOW (ref 80.0–100.0)
pO2, Arterial: 57.5 mmHg — ABNORMAL LOW (ref 80.0–100.0)

## 2013-12-10 LAB — CBC WITH DIFFERENTIAL/PLATELET
Basophils Absolute: 0 10*3/uL (ref 0.0–0.1)
Basophils Relative: 1 % (ref 0–1)
EOS ABS: 0 10*3/uL (ref 0.0–0.7)
Eosinophils Relative: 1 % (ref 0–5)
HCT: 46.6 % — ABNORMAL HIGH (ref 36.0–46.0)
HEMOGLOBIN: 14.5 g/dL (ref 12.0–15.0)
LYMPHS ABS: 1.4 10*3/uL (ref 0.7–4.0)
Lymphocytes Relative: 22 % (ref 12–46)
MCH: 32.4 pg (ref 26.0–34.0)
MCHC: 31.1 g/dL (ref 30.0–36.0)
MCV: 104.3 fL — ABNORMAL HIGH (ref 78.0–100.0)
MONOS PCT: 9 % (ref 3–12)
Monocytes Absolute: 0.6 10*3/uL (ref 0.1–1.0)
NEUTROS ABS: 4.3 10*3/uL (ref 1.7–7.7)
NEUTROS PCT: 68 % (ref 43–77)
Platelets: 185 10*3/uL (ref 150–400)
RBC: 4.47 MIL/uL (ref 3.87–5.11)
RDW: 16.9 % — ABNORMAL HIGH (ref 11.5–15.5)
WBC: 6.4 10*3/uL (ref 4.0–10.5)

## 2013-12-10 LAB — COMPREHENSIVE METABOLIC PANEL
ALK PHOS: 101 U/L (ref 39–117)
ALT: 21 U/L (ref 0–35)
AST: 23 U/L (ref 0–37)
Albumin: 3.1 g/dL — ABNORMAL LOW (ref 3.5–5.2)
BILIRUBIN TOTAL: 0.6 mg/dL (ref 0.3–1.2)
BUN: 23 mg/dL (ref 6–23)
CHLORIDE: 101 meq/L (ref 96–112)
CO2: 32 meq/L (ref 19–32)
Calcium: 9.1 mg/dL (ref 8.4–10.5)
Creatinine, Ser: 1.11 mg/dL — ABNORMAL HIGH (ref 0.50–1.10)
GFR calc non Af Amer: 47 mL/min — ABNORMAL LOW (ref >90)
GFR, EST AFRICAN AMERICAN: 54 mL/min — AB (ref >90)
GLUCOSE: 132 mg/dL — AB (ref 70–99)
POTASSIUM: 4 meq/L (ref 3.7–5.3)
Sodium: 144 mEq/L (ref 137–147)
Total Protein: 6.6 g/dL (ref 6.0–8.3)

## 2013-12-10 LAB — URINALYSIS, ROUTINE W REFLEX MICROSCOPIC
BILIRUBIN URINE: NEGATIVE
Glucose, UA: NEGATIVE mg/dL
KETONES UR: NEGATIVE mg/dL
Nitrite: NEGATIVE
Protein, ur: 100 mg/dL — AB
Specific Gravity, Urine: >1.03 — ABNORMAL HIGH (ref 1.005–1.030)
UROBILINOGEN UA: 1 mg/dL (ref 0.0–1.0)
pH: 6 (ref 5.0–8.0)

## 2013-12-10 LAB — MAGNESIUM: Magnesium: 1.9 mg/dL (ref 1.5–2.5)

## 2013-12-10 LAB — TROPONIN I: Troponin I: <0.3 ng/mL (ref <0.30)

## 2013-12-10 LAB — URINE MICROSCOPIC-ADD ON

## 2013-12-10 LAB — PROTIME-INR
INR: 1.04 (ref 0.00–1.49)
Prothrombin Time: 13.4 seconds (ref 11.6–15.2)

## 2013-12-10 LAB — MRSA PCR SCREENING: MRSA by PCR: NEGATIVE

## 2013-12-10 LAB — PRO B NATRIURETIC PEPTIDE: PRO B NATRI PEPTIDE: 5669 pg/mL — AB (ref 0–450)

## 2013-12-10 LAB — LACTATE DEHYDROGENASE: LDH: 280 U/L — AB (ref 94–250)

## 2013-12-10 MED ORDER — ALLOPURINOL 300 MG PO TABS
300.0000 mg | ORAL_TABLET | Freq: Every day | ORAL | Status: DC
  Administered 2013-12-10 – 2013-12-11 (×2): 300 mg via ORAL
  Filled 2013-12-10 (×2): qty 1

## 2013-12-10 MED ORDER — METOPROLOL TARTRATE 50 MG PO TABS
50.0000 mg | ORAL_TABLET | Freq: Every evening | ORAL | Status: DC
  Filled 2013-12-10 (×2): qty 1

## 2013-12-10 MED ORDER — METOPROLOL TARTRATE 50 MG PO TABS: 50.0000 mg | ORAL_TABLET | Freq: Two times a day (BID) | ORAL | Status: DC

## 2013-12-10 MED ORDER — LOSARTAN POTASSIUM 50 MG PO TABS
100.0000 mg | ORAL_TABLET | Freq: Every day | ORAL | Status: DC
  Administered 2013-12-10 – 2013-12-11 (×3): 100 mg via ORAL
  Filled 2013-12-10 (×2): qty 2

## 2013-12-10 MED ORDER — SODIUM CHLORIDE 0.9 % IJ SOLN
3.0000 mL | INTRAMUSCULAR | Status: DC | PRN
  Administered 2013-12-10: 3 mL via INTRAVENOUS

## 2013-12-10 MED ORDER — FUROSEMIDE 10 MG/ML IJ SOLN
80.0000 mg | Freq: Two times a day (BID) | INTRAMUSCULAR | Status: DC
  Administered 2013-12-10 – 2013-12-12 (×4): 80 mg via INTRAVENOUS
  Filled 2013-12-10 (×4): qty 8

## 2013-12-10 MED ORDER — METOPROLOL TARTRATE 50 MG PO TABS
100.0000 mg | ORAL_TABLET | Freq: Every morning | ORAL | Status: DC
  Administered 2013-12-10 – 2013-12-11 (×2): 100 mg via ORAL
  Filled 2013-12-10 (×2): qty 2

## 2013-12-10 MED ORDER — ATORVASTATIN CALCIUM 10 MG PO TABS
10.0000 mg | ORAL_TABLET | Freq: Every day | ORAL | Status: DC
  Administered 2013-12-11 – 2013-12-18 (×8): 10 mg via ORAL
  Filled 2013-12-10 (×7): qty 1

## 2013-12-10 MED ORDER — CHLORHEXIDINE GLUCONATE 0.12 % MT SOLN
15.0000 mL | Freq: Two times a day (BID) | OROMUCOSAL | Status: DC
Start: 2013-12-10 — End: 2013-12-12
  Administered 2013-12-10 – 2013-12-11 (×2): 15 mL via OROMUCOSAL
  Filled 2013-12-10 (×4): qty 15

## 2013-12-10 MED ORDER — BIOTENE DRY MOUTH MT LIQD
15.0000 mL | Freq: Two times a day (BID) | OROMUCOSAL | Status: DC
  Administered 2013-12-11 – 2013-12-18 (×14): 15 mL via OROMUCOSAL

## 2013-12-10 MED ORDER — IPRATROPIUM-ALBUTEROL 0.5-2.5 (3) MG/3ML IN SOLN
3.0000 mL | Freq: Once | RESPIRATORY_TRACT | Status: AC
  Administered 2013-12-10: 3 mL via RESPIRATORY_TRACT
  Filled 2013-12-10: qty 3

## 2013-12-10 MED ORDER — PREDNISONE 50 MG PO TABS
60.0000 mg | ORAL_TABLET | ORAL | Status: AC
  Administered 2013-12-10: 60 mg via ORAL
  Filled 2013-12-10 (×2): qty 1

## 2013-12-10 MED ORDER — FUROSEMIDE 40 MG PO TABS
40.0000 mg | ORAL_TABLET | Freq: Once | ORAL | Status: AC
  Administered 2013-12-10: 40 mg via ORAL
  Filled 2013-12-10: qty 1

## 2013-12-10 MED ORDER — METHYLPREDNISOLONE SODIUM SUCC 125 MG IJ SOLR
125.0000 mg | Freq: Once | INTRAMUSCULAR | Status: AC
  Administered 2013-12-10: 125 mg via INTRAVENOUS
  Filled 2013-12-10: qty 2

## 2013-12-10 MED ORDER — ALBUTEROL SULFATE (2.5 MG/3ML) 0.083% IN NEBU
2.5000 mg | INHALATION_SOLUTION | Freq: Once | RESPIRATORY_TRACT | Status: AC
  Administered 2013-12-10: 2.5 mg via RESPIRATORY_TRACT
  Filled 2013-12-10: qty 3

## 2013-12-10 MED ORDER — FUROSEMIDE 10 MG/ML IJ SOLN
40.0000 mg | Freq: Once | INTRAMUSCULAR | Status: AC
  Administered 2013-12-10: 40 mg via INTRAVENOUS
  Filled 2013-12-10: qty 4

## 2013-12-10 MED ORDER — ONDANSETRON HCL 4 MG/2ML IJ SOLN
4.0000 mg | Freq: Four times a day (QID) | INTRAMUSCULAR | Status: DC | PRN
  Administered 2013-12-11 – 2013-12-15 (×4): 4 mg via INTRAVENOUS
  Filled 2013-12-10 (×4): qty 2

## 2013-12-10 MED ORDER — SODIUM CHLORIDE 0.9 % IV SOLN
250.0000 mL | INTRAVENOUS | Status: DC | PRN
  Administered 2013-12-12: 250 mL via INTRAVENOUS

## 2013-12-10 MED ORDER — SODIUM CHLORIDE 0.9 % IJ SOLN
3.0000 mL | Freq: Two times a day (BID) | INTRAMUSCULAR | Status: DC
  Administered 2013-12-10 – 2013-12-17 (×14): 3 mL via INTRAVENOUS

## 2013-12-10 MED ORDER — MOMETASONE FURO-FORMOTEROL FUM 100-5 MCG/ACT IN AERO
2.0000 | INHALATION_SPRAY | Freq: Two times a day (BID) | RESPIRATORY_TRACT | Status: DC
Start: 2013-12-10 — End: 2013-12-18
  Administered 2013-12-10 – 2013-12-18 (×16): 2 via RESPIRATORY_TRACT
  Filled 2013-12-10: qty 8.8

## 2013-12-10 MED ORDER — ALBUTEROL SULFATE (2.5 MG/3ML) 0.083% IN NEBU: 3.0000 mL | INHALATION_SOLUTION | RESPIRATORY_TRACT | Status: DC | PRN

## 2013-12-10 MED ORDER — HYDROCODONE-ACETAMINOPHEN 5-325 MG PO TABS
0.5000 | ORAL_TABLET | Freq: Every day | ORAL | Status: DC | PRN
  Administered 2013-12-11 – 2013-12-13 (×3): 0.5 via ORAL
  Filled 2013-12-10 (×4): qty 1

## 2013-12-10 MED ORDER — ACETAMINOPHEN 325 MG PO TABS
650.0000 mg | ORAL_TABLET | ORAL | Status: DC | PRN
  Administered 2013-12-14: 650 mg via ORAL
  Filled 2013-12-10: qty 2

## 2013-12-10 MED ORDER — MOMETASONE FURO-FORMOTEROL FUM 100-5 MCG/ACT IN AERO
INHALATION_SPRAY | RESPIRATORY_TRACT | Status: AC
  Filled 2013-12-10: qty 8.8

## 2013-12-10 MED ORDER — ASPIRIN EC 81 MG PO TBEC
81.0000 mg | DELAYED_RELEASE_TABLET | Freq: Every day | ORAL | Status: DC
  Administered 2013-12-10 – 2013-12-12 (×3): 81 mg via ORAL
  Filled 2013-12-10 (×3): qty 1

## 2013-12-10 MED ORDER — HEPARIN SODIUM (PORCINE) 5000 UNIT/ML IJ SOLN
5000.0000 [IU] | Freq: Three times a day (TID) | INTRAMUSCULAR | Status: DC
  Administered 2013-12-10 – 2013-12-18 (×25): 5000 [IU] via SUBCUTANEOUS
  Filled 2013-12-10 (×24): qty 1

## 2013-12-10 MED ORDER — GABAPENTIN 300 MG PO CAPS
300.0000 mg | ORAL_CAPSULE | Freq: Every day | ORAL | Status: DC
  Administered 2013-12-10 – 2013-12-17 (×8): 300 mg via ORAL
  Filled 2013-12-10 (×8): qty 1

## 2013-12-10 NOTE — Progress Notes (Signed)
DISCUSSED NEW ABG RESULTS W/ DR Radonna RickerFELIZ ORTIZ. NEW ORDERS FOR BIPAP SETTING.  PT IS NOT TO COME OFF BIPAP TO EAT  YET.

## 2013-12-10 NOTE — ED Notes (Signed)
Pt has ble wrapped. Wound care done weekly on Wednesdays by home health.

## 2013-12-10 NOTE — ED Notes (Signed)
Pt reports relief from breathing tx. Pt states "I feel like I can move air easier".

## 2013-12-10 NOTE — H&P (Addendum)
[facility] History and Physical  ASTER SCREWS ZOX:[id number] DOB: 02-19-1935 DOA: 12/10/2013  Referring physician: Dr. Jeraldine Loots PCP: Ninfa Linden, FNP   Chief Complaint: SOB  HPI: Paula Fletcher is a 79 y.o. female  Past medical history of COPD, questionable heart failure unknown etiology, that comes in for shortness of breath this started one week prior to admission progressively getting worse to the point where she can even walk up the stairs without getting short of breath. She relates is been gradually getting worst she would have been started on Advair and albuterol without any relief. She's experienced some cough feels fatigued, she's also noticed swelling of the legs. She cannot lay flat  To sleep for the past 2 days. She denies any chest pain nausea vomiting or palpitations.  In the ED: An EKG was checked this shows atrial fibrillation, CBC showed an MCV of 104 with a normal hemoglobin, BMP of greater than 5000 a complete metabolic panel with a creatinine of 1.1 first set of cardiac enzymes so he consulted for further evaluation.   Review of Systems:  Constitutional:  No weight loss, night sweats, Fevers, chills, fatigue.  HEENT:  No headaches, Difficulty swallowing,Tooth/dental problems,Sore throat,  No sneezing, itching, ear ache, nasal congestion, post nasal drip,  Cardio-vascular:  No chest pain, Orthopnea, PND, swelling in lower extremities, anasarca, dizziness, palpitations  GI:  No heartburn, indigestion, abdominal pain, nausea, vomiting, diarrhea, change in bowel habits, loss of appetite  Resp:   Skin:  no rash or lesions.  GU:  no dysuria, change in color of urine, no urgency or frequency. No flank pain.  Musculoskeletal:  No joint pain or swelling. No decreased range of motion. No back pain.  Psych:  No change in mood or affect. No depression or anxiety. No memory loss.   Past Medical History  Diagnosis Date  . COPD (chronic obstructive pulmonary  disease)   . Hypertension   . Hyperlipidemia   . Gout   . Poor historian    Past Surgical History  Procedure Laterality Date  . Cholecystectomy    . Appendectomy    . Abdominal hysterectomy     Social History:  reports that she has quit smoking. Her smoking use included Cigarettes. She smoked 0.00 packs per day. She does not have any smokeless tobacco history on file. She reports that she does not drink alcohol. Her drug history is not on file.  No Known Allergies  Family History  Problem Relation Age of Onset  . Heart failure Mother      Prior to Admission medications   Medication Sig Start Date End Date Taking? Authorizing Provider  albuterol (PROVENTIL HFA;VENTOLIN HFA) 108 (90 BASE) MCG/ACT inhaler Inhale 1-2 puffs into the lungs every 4 (four) hours as needed for wheezing or shortness of breath.   Yes Historical Provider, MD  allopurinol (ZYLOPRIM) 300 MG tablet Take 300 mg by mouth daily.   Yes Historical Provider, MD  Fluticasone-Salmeterol (ADVAIR) 250-50 MCG/DOSE AEPB Inhale 1 puff into the lungs 2 (two) times daily.   Yes Historical Provider, MD  furosemide (LASIX) 40 MG tablet Take 40 mg by mouth daily.   Yes Historical Provider, MD  gabapentin (NEURONTIN) 300 MG capsule Take 300 mg by mouth at bedtime.   Yes Historical Provider, MD  HYDROcodone-acetaminophen (NORCO/VICODIN) 5-325 MG per tablet Take 0.5 tablets by mouth daily as needed for moderate pain.   Yes Historical Provider, MD  losartan (COZAAR) 100 MG tablet Take 100 mg by mouth  daily.   Yes Historical Provider, MD  metoprolol (LOPRESSOR) 50 MG tablet Take 50-100 mg by mouth 2 (two) times daily. 2 tablets in the morning and 1 tablet in the evening.   Yes Historical Provider, MD  naproxen sodium (ANAPROX) 220 MG tablet Take 220 mg by mouth at bedtime.   Yes Historical Provider, MD  NIFEdipine (PROCARDIA-XL/ADALAT-CC/NIFEDICAL-XL) 30 MG 24 hr tablet Take 30 mg by mouth daily.   Yes Historical Provider, MD   rosuvastatin (CRESTOR) 40 MG tablet Take 40 mg by mouth daily.   Yes Historical Provider, MD  Simethicone (GAS RELIEF PO) Take 1 tablet by mouth daily.   Yes Historical Provider, MD   Physical Exam: Filed Vitals:   12/10/13 1311  BP: 111/75  Pulse: 65  Temp:   Resp: 30    BP 111/75  Pulse 65  Temp(Src) 98.3 F (36.8 C) (Oral)  Resp 30  SpO2 84%  General:  Appears calm and uncomfortable Eyes: PERRL, normal lids, irises & conjunctiva ENT: grossly normal hearing, lips & tongue Neck: +JVD Cardiovascular: IRR, no m/r/g. Significant lower extremity edema Respiratory: Moderate air movement with crackles bilaterally with decreased sounds on the right lower lobe Abdomen: soft, ntnd Skin: no rash or induration seen on limited exam Musculoskeletal: grossly normal tone BUE/BLE Psychiatric: grossly normal mood and affect, speech fluent and appropriate Neurologic: grossly non-focal.          Labs on Admission:  Basic Metabolic Panel:  Recent Labs Lab 12/10/13 1130  NA 144  K 4.0  CL 101  CO2 32  GLUCOSE 132*  BUN 23  CREATININE 1.11*  CALCIUM 9.1   Liver Function Tests:  Recent Labs Lab 12/10/13 1130  AST 23  ALT 21  ALKPHOS 101  BILITOT 0.6  PROT 6.6  ALBUMIN 3.1*   No results found for this basename: LIPASE, AMYLASE,  in the last 168 hours No results found for this basename: AMMONIA,  in the last 168 hours CBC:  Recent Labs Lab 12/10/13 1052  WBC 6.4  NEUTROABS 4.3  HGB 14.5  HCT 46.6*  MCV 104.3*  PLT 185   Cardiac Enzymes:  Recent Labs Lab 12/10/13 1213  TROPONINI <0.30    BNP (last 3 results)  Recent Labs  12/10/13 1130  PROBNP 5669.0*   CBG: No results found for this basename: GLUCAP,  in the last 168 hours  Radiological Exams on Admission: Dg Chest Portable 1 View  12/10/2013   CLINICAL DATA:  Bilateral leg swelling.  Productive cough.  EXAM: PORTABLE CHEST - 1 VIEW  COMPARISON:  10/19/2008  FINDINGS: Opacity obscures the  hemidiaphragms and portions of the heart borders, right greater than left. This consistent with effusions with lung base opacity that may reflect atelectasis or infiltrate. There is vascular congestion centrally.  No pneumothorax.  IMPRESSION: 1. Right greater than left pleural effusions with associated parenchymal opacity, which may reflect atelectasis or infiltrate. There is vascular congestion without overt edema.   Electronically Signed   By: Amie Portlandavid  Ormond M.D.   On: 12/10/2013 10:56    EKG: Independently reviewed. Atrial fibrillation rate controlled, nonspecific T wave changes.  Assessment/Plan  Acute respiratory failure - She has positive JVD, with crackles bilaterally, BNP of greater than 5000, chest x-ray showing bilateral pleural effusion, she is on Lasix on metoprolol, so the most probable cause for acute hypoxic respiratory failure would be acute decompensated heart failure. I will go ahead and admit her to the step down unit start her on BiPAP,  we'll go ahead and start her on IV Lasix 80 mg twice a day, continue her ACE inhibitor and her beta blocker. We'll insert a Foley and monitor strict I.'s and O.'s, daily weights. Order a 2-D echo. Check cardiac enzymes. - Check an ABG.  -  Monitor electrolytes.   Essential hypertension - BP seems to be stable at this time, will continue her beta blocker and her ACE inhibitor. And DC her Procardia. - Continue Lasix IV.  COPD (chronic obstructive pulmonary disease) - Continue Advair and albuterol when necessary.   New onset Atrial fibrillation - Currently rate controlled, continue metoprolol.  Recurrent right pleural effusion - Unclear etiology at this time she said in good 80% on 4 L of oxygen, placed on BiPAP. We'll ask IR to perform a bedside paracentesis the most probable cause for her pleural effusion is likely acute decompensated heart failure. - Check a PT/INR consult IR for thoracocentesis. Protein, albumin and, LDH   Code Status:  full Family Communication: daughter Disposition Plan: inpatient (indicate anticipated LOS)  Time spent: 95  Marinda Elk [facility] Pager [phone]

## 2013-12-10 NOTE — ED Notes (Signed)
Pt states productive cough, brown in color. States SOB x 2 days

## 2013-12-10 NOTE — ED Provider Notes (Signed)
CSN: [id number]     Arrival date & time 12/10/13  1017 History   This chart was scribed for Paula Munch, MD, by Paula Fletcher, ED Scribe. This patient was seen in room APA09/APA09 and the patient's care was started at 10:34 AM.  First MD Initiated Contact with Patient 12/10/13 1023     Chief Complaint  Patient presents with  . Shortness of Breath   HPI HPI Comments: Paula Fletcher is a 79 y.o. female, with a h/o COPD,  who presents to the Emergency Department complaining of gradually-increasing SOB which has been occurring for approximately a week. The pt began taking Advair 250 two days ago and she has used albuterol at home without relief. She states she has also experienced  a cough productive of brown sputum, fatigue, and difficulty ambulating. She also reports several episodes of emesis and one unwitnessed LOC yesterday. Her daughter reports the pt has experienced slurred speech and swelling to her face. Her daughter also reports the pt "stays cold."  [name] reports chronic baseline swelling to her legs. The pt denies sustained chest pain, abdominal pain, or fever. She reports a h/o MI.  She denies a h/o stroke or blood clots. [name] is a former smoker.   Past Medical History  Diagnosis Date  . COPD (chronic obstructive pulmonary disease)   . Hypertension   . Hyperlipidemia   . Gout   . Poor historian    Past Surgical History  Procedure Laterality Date  . Cholecystectomy    . Appendectomy    . Abdominal hysterectomy     No family history on file. History  Substance Use Topics  . Smoking status: Former Games developer  . Smokeless tobacco: Not on file  . Alcohol Use: No   No OB history provided.  Review of Systems  Constitutional:       Per HPI, otherwise negative  HENT:       Per HPI, otherwise negative  Respiratory:       Per HPI, otherwise negative  Cardiovascular:       Per HPI, otherwise negative  Gastrointestinal: Positive for vomiting.  Endocrine:        Negative aside from HPI  Genitourinary:       Neg aside from HPI   Musculoskeletal:       Per HPI, otherwise negative  Skin: Negative.   Neurological: Positive for syncope.    Allergies  Review of patient's allergies indicates no known allergies.  Home Medications  No current outpatient prescriptions on file.  Triage Vitals: BP 96/65  Pulse 92  Temp(Src) 98.3 F (36.8 C) (Oral)  Resp 32  SpO2 81%  Physical Exam  Nursing note and vitals reviewed. Constitutional: She is oriented to person, place, and time. She appears well-developed and well-nourished. No distress.  HENT:  Head: Normocephalic and atraumatic.  Eyes: Conjunctivae and EOM are normal.  Cardiovascular: Normal rate and regular rhythm.   A-fibrillation    Pulmonary/Chest: Effort normal and breath sounds normal. No stridor. No respiratory distress.  Tachypnic. Coarse breath sounds bilaterally.   Abdominal: She exhibits no distension.  Musculoskeletal: She exhibits no edema.  Neurological: She is alert and oriented to person, place, and time. No cranial nerve deficit.  Skin: Skin is warm and dry.  Psychiatric: She has a normal mood and affect.    ED Course  Procedures (including critical care time)  DIAGNOSTIC STUDIES: Oxygen Saturation is 81% on 3 L/min [place], low by my interpretation.  COORDINATION OF CARE:  10:41 AM- Discussed treatment plan with patient, and the patient agreed to the plan.   11:58 AM- Rechecked pt. Pt was improved after a primary breathing treatment. Second breathing treatment ordered.   12:45 PM- Rechecked pt. Informed pt that she would be admitted. Pt stated she was hungry.   Labs Review Labs Reviewed  CBC WITH DIFFERENTIAL - Abnormal; Notable for the following:    HCT 46.6 (*)    MCV 104.3 (*)    RDW 16.9 (*)    All other components within normal limits  COMPREHENSIVE METABOLIC PANEL - Abnormal; Notable for the following:    Glucose, Bld 132 (*)    Creatinine, Ser 1.11 (*)     Albumin 3.1 (*)    GFR calc non Af Amer 47 (*)    GFR calc Af Amer 54 (*)    All other components within normal limits  PRO B NATRIURETIC PEPTIDE - Abnormal; Notable for the following:    Pro B Natriuretic peptide (BNP) 5669.0 (*)    All other components within normal limits  URINALYSIS, ROUTINE W REFLEX MICROSCOPIC - Abnormal; Notable for the following:    APPearance CLOUDY (*)    Specific Gravity, Urine >1.030 (*)    Hgb urine dipstick TRACE (*)    Protein, ur 100 (*)    Leukocytes, UA TRACE (*)    All other components within normal limits  URINE MICROSCOPIC-ADD ON - Abnormal; Notable for the following:    Squamous Epithelial / LPF MANY (*)    Bacteria, UA MANY (*)    All other components within normal limits  URINE CULTURE  TROPONIN I   Imaging Review Dg Chest Portable 1 View  12/10/2013   CLINICAL DATA:  Bilateral leg swelling.  Productive cough.  EXAM: PORTABLE CHEST - 1 VIEW  COMPARISON:  10/19/2008  FINDINGS: Opacity obscures the hemidiaphragms and portions of the heart borders, right greater than left. This consistent with effusions with lung base opacity that may reflect atelectasis or infiltrate. There is vascular congestion centrally.  No pneumothorax.  IMPRESSION: 1. Right greater than left pleural effusions with associated parenchymal opacity, which may reflect atelectasis or infiltrate. There is vascular congestion without overt edema.   Electronically Signed   By: Amie Portlandavid  Ormond M.D.   On: 12/10/2013 10:56    EKG Interpretation    Date/Time:  Sunday December 10 2013 10:27:55 EST Ventricular Rate:  93 PR Interval:    QRS Duration: 78 QT Interval:  334 QTC Calculation: 415 R Axis:   -45 Text Interpretation:  Atrial fibrillation Left axis deviation Low voltage QRS Cannot rule out Anteroseptal infarct , age undetermined Abnormal ECG When compared with ECG of 15-Aug-2002 05:19, Significant changes have occurred Atrial fibrillation Left axis deviation Artifact new afib  Abnormal ekg Confirmed by Paula MunchLOCKWOOD, Keondria Siever  MD [phone]([id number]) on 12/10/2013 10:53:46 AM           After the initial evaluation I reviewed her chart, and I discussed her case with our cardiologist.  Following two nebs, and oral steroids, the patient remains hypoxic.  After discussing her care with our hospitalist team, BiPaP will be started. BP ~105/50.    MDM   1. Acute respiratory failure   2. Atrial fibrillation   3. Essential hypertension   4. Recurrent right pleural effusion     I personally performed the services described in this documentation, which was scribed in my presence. The recorded information has been reviewed and is accurate.  Patient presents with dyspnea.  Notably, the patient has history of lower extremity edema, COPD, but no prior history of CHF, nor atrial fibrilation. On presentation the patient is hypoxic, tachypneic.  Patient has some improvement with multiple nebulizer therapy, steroids, but remains hypoxic.  With her new evidence of heart failure, atrial fibrillation, pleural effusion, BiPAP was started.  Patient coordination for further evaluation and management.   CRITICAL CARE Performed by: Paula Fletcher Total critical care time: 40 Critical care time was exclusive of separately billable procedures and treating other patients. Critical care was necessary to treat or prevent imminent or life-threatening deterioration. Critical care was time spent personally by me on the following activities: development of treatment plan with patient and/or surrogate as well as nursing, discussions with consultants, evaluation of patient's response to treatment, examination of patient, obtaining history from patient or surrogate, ordering and performing treatments and interventions, ordering and review of laboratory studies, ordering and review of radiographic studies, pulse oximetry and re-evaluation of patient's condition.    Paula Munch, MD 12/10/13 1536

## 2013-12-11 ENCOUNTER — Inpatient Hospital Stay (HOSPITAL_COMMUNITY): Payer: Medicare FFS

## 2013-12-11 ENCOUNTER — Encounter (HOSPITAL_COMMUNITY): Payer: Self-pay

## 2013-12-11 DIAGNOSIS — I059 Rheumatic mitral valve disease, unspecified: Secondary | ICD-10-CM

## 2013-12-11 DIAGNOSIS — J449 Chronic obstructive pulmonary disease, unspecified: Secondary | ICD-10-CM

## 2013-12-11 LAB — BODY FLUID CELL COUNT WITH DIFFERENTIAL
Eos, Fluid: 0 %
LYMPHS FL: 68 %
MONOCYTE-MACROPHAGE-SEROUS FLUID: 24 % — AB (ref 50–90)
Neutrophil Count, Fluid: 8 % (ref 0–25)
Total Nucleated Cell Count, Fluid: 389 cu mm (ref 0–1000)

## 2013-12-11 LAB — BASIC METABOLIC PANEL
BUN: 35 mg/dL — ABNORMAL HIGH (ref 6–23)
CALCIUM: 9 mg/dL (ref 8.4–10.5)
CO2: 34 mEq/L — ABNORMAL HIGH (ref 19–32)
CREATININE: 1.37 mg/dL — AB (ref 0.50–1.10)
Chloride: 102 mEq/L (ref 96–112)
GFR calc non Af Amer: 36 mL/min — ABNORMAL LOW (ref >90)
GFR, EST AFRICAN AMERICAN: 42 mL/min — AB (ref >90)
Glucose, Bld: 141 mg/dL — ABNORMAL HIGH (ref 70–99)
Potassium: 4 mEq/L (ref 3.7–5.3)
Sodium: 146 mEq/L (ref 137–147)

## 2013-12-11 LAB — TROPONIN I: TROPONIN I: 0.32 ng/mL — AB (ref <0.30)

## 2013-12-11 LAB — PROTEIN, BODY FLUID: Total protein, fluid: 1.3 g/dL

## 2013-12-11 LAB — LACTATE DEHYDROGENASE, PLEURAL OR PERITONEAL FLUID: LD FL: 118 U/L — AB (ref 3–23)

## 2013-12-11 LAB — PH, BODY FLUID: pH, Fluid: 8.5

## 2013-12-11 NOTE — Care Management Note (Addendum)
Page 1 of 2   12/18/2013     4:17:11 PM   CARE MANAGEMENT NOTE 12/18/2013  Patient:  Paula MartesSMITH,Stephaine M   Account Number:  [id number]  Date Initiated:  12/11/2013  Documentation initiated by:  Sharrie RothmanBLACKWELL,Maurisio Ruddy C  Subjective/Objective Assessment:   Pt admitted from home with CHF/COPD. Pt lives with her husband and wants to return home at discharge. Pt has a cane and walker for home use. Pt also states that someone comes into the home to wrap her legs but cannot recall name of agency.     Action/Plan:   [facility]. May need home O2.   Anticipated DC Date:  12/18/2013   Anticipated DC Plan:  HOME W HOME HEALTH SERVICES      DC Planning Services  CM consult      PAC Choice  DURABLE MEDICAL EQUIPMENT  HOME HEALTH   Choice offered to / List presented to:  C-1 Patient   DME arranged  [facility]      DME agency  [facility]     HH arranged  HH-1 RN  HH-2 PT      HH agency  [facility]   Status of service:  Completed, signed off Medicare Important Message given?  YES (If response is "NO", the following Medicare IM given date fields will be blank) Date Medicare IM given:  12/15/2013 Date Additional Medicare IM given:  12/18/2013  Discharge Disposition:  HOME W HOME HEALTH SERVICES  Per UR Regulation:    If discussed at Long Length of Stay Meetings, dates discussed:    Comments:  12/18/13 1615 Arlyss Queenammy Khalfani Weideman, RN BSN CM [facility] at [facility], pt O2 should be delivered at 6:30 to the hospital. Pt and pts daughter Delice Bisonara have been updated. Pts nurse Morrie Sheldonshley also aware. Contact information also given to pts nurse Morrie SheldonAshley to contact [facility] if O2 not delivered at time given. No other CM needs noted.   12/18/13 1515 Arlyss Queenammy Colette Dicamillo, RN BSN CM Pt still in hospital due to O2 not delivered on 12/17/13. Information resent to [facility] at 0830. Cm called again at around 1115 and they confirmed that all information had been  received and will call when ready for delivery to hospital and home. No return call by 1215 so CM called again and [facility] said they were working on paperwork and would elevate it to the supervisior for quicker processing. At 1400 CM spoke with supervisior Debra at [facility] who said she would call CM back with a time for delivery. at 1510 CM had not received phone call by [facility], so CM called and Shanda [facility]umpsJessica at [facility] stated that paperwork had just been completed and [facility] would call CM back shortly for delivery time. Pt and her Daugher, Thurnell Loseara [place] has been updated throughout the day with the delays and process. Pt for d/c when O2 arranged. [facility] HH has been arranged and HH services to start within 48 hours of discharge. No other DME needs at this time. Pts nurse aware of discharge arrangements.   12/15/13 1315 Arlyss Queenammy Kaulin Chaves, RN BSN CM Pt for discharge over the weekend. [facility] and will need PT at discharge. Lillie FragminEmma Blaylock [facility] is aware and will collect the pts information from the chart. HH services to start within 48 hours of discharge. Pt may need home O2 that will need to be arranged with [facility] (insurance reason). Pt and pts nurse  aware of discharge arrangements.  12/11/13 1440 Arlyss Queen, RN BSN CM

## 2013-12-11 NOTE — Progress Notes (Signed)
Pt taken off BiPAP placed on non rebreather mask for now SAT's 96.

## 2013-12-11 NOTE — Progress Notes (Signed)
Patient ID: Paula Fletcher  female  ZOX:[id number]    DOB: Nov 26, 1934    DOA: 12/10/2013  PCP: Ninfa Linden, FNP  Assessment/Plan:  Primary problem: Acute hypoxic respiratory failure: Likely due to acute CHF, bilateral pleural effusions, COPD, off BiPAP, elevated BNP 5669, lower extremity edema - No recent cardiac workup, prior echo in 2003, EF was normal - Continue IV diuresis, aspirin, beta blocker, losartan - strict I's/O's, daily weights  - CXR 2/2 with bilateral pleural effusions - Follow 2-D echo, consult cards pending echo results   Active Problems: Essential hypertension  - BP seems to be stable at this time, will continue her beta blocker, ACE inhibitor.  - DC Procardia.  - Continue Lasix IV.   COPD (chronic obstructive pulmonary disease)  - Continue Advair and albuterol when necessary.   New onset Atrial fibrillation  - Currently rate controlled, continue metoprolol.  - will d/w cardiology   Recurrent right pleural effusion: probable cause for her pleural effusion is likely acute decompensated heart failure.  - Unclear etiology at this time, was placed on BiPAP, possible thoracentesis - pleural fluid studies, ph, cell count with differential, protein, albumin and, LDH    peripheral edema: Likely due to Acute CHF - Continue Lasix, patient was getting wound care/ wraps weekly.    DVT Prophylaxis:  Code Status:  Family Communication:  Disposition: cont SDU    Subjective: somewhat short of breath, although feeling better, off BiPAP, sats 96% on NRMask  Objective: Weight change:   Intake/Output Summary (Last 24 hours) at 12/11/13 1244 Last data filed at 12/11/13 1241  Gross per 24 hour  Intake    980 ml  Output   1750 ml  Net   -770 ml   Blood pressure 122/72, pulse 73, temperature 97.6 F (36.4 C), temperature source Axillary, resp. rate 24, height 5\' 8"  (1.727 m), weight 113.4 kg (250 lb), SpO2 96.00%.  Physical Exam: General: Alert and awake,  oriented x3, in mild distress  CVS: S1-S2 clear, no murmur rubs or gallops Chest:  rhonchi, decreased breath sounds at the bases  Abdomen: soft nontender, nondistended, normal bowel sounds  Extremities: no cyanosis, clubbing. 1-2+ edema Neuro: Cranial nerves II-XII intact, no focal neurological deficits  Lab Results: Basic Metabolic Panel:  Recent Labs Lab 12/10/13 1130 12/11/13 0654  NA 144 146  K 4.0 4.0  CL 101 102  CO2 32 34*  GLUCOSE 132* 141*  BUN 23 35*  CREATININE 1.11* 1.37*  CALCIUM 9.1 9.0  MG 1.9  --    Liver Function Tests:  Recent Labs Lab 12/10/13 1130  AST 23  ALT 21  ALKPHOS 101  BILITOT 0.6  PROT 6.6  ALBUMIN 3.1*   No results found for this basename: LIPASE, AMYLASE,  in the last 168 hours No results found for this basename: AMMONIA,  in the last 168 hours CBC:  Recent Labs Lab 12/10/13 1052  WBC 6.4  NEUTROABS 4.3  HGB 14.5  HCT 46.6*  MCV 104.3*  PLT 185   Cardiac Enzymes:  Recent Labs Lab 12/10/13 1834 12/11/13 0045 12/11/13 0654  TROPONINI <0.30 0.32* <0.30   BNP: No components found with this basename: POCBNP,  CBG: No results found for this basename: GLUCAP,  in the last 168 hours   Micro Results: Recent Results (from the past 240 hour(s))  MRSA PCR SCREENING     Status: None   Collection Time    12/10/13  5:55 PM      Result Value  Range Status   MRSA by PCR NEGATIVE  NEGATIVE Final   Comment:            The GeneXpert MRSA Assay (FDA     approved for NASAL specimens     only), is one component of a     comprehensive MRSA colonization     surveillance program. It is not     intended to diagnose MRSA     infection nor to guide or     monitor treatment for     MRSA infections.    Studies/Results: Dg Chest Port 1 View  12/11/2013   CLINICAL DATA:  Shortness of breath  EXAM: PORTABLE CHEST - 1 VIEW  COMPARISON:  12/10/2013  FINDINGS: The cardiac shadow is stable. Bilateral pleural effusions are again seen. Mild  vascular congestion is also again seen.  IMPRESSION: No change from the previous exam. Vascular congestion and pleural effusions are stable.   Electronically Signed   By: Alcide CleverMark  Lukens M.D.   On: 12/11/2013 09:39   Dg Chest Portable 1 View  12/10/2013   CLINICAL DATA:  Bilateral leg swelling.  Productive cough.  EXAM: PORTABLE CHEST - 1 VIEW  COMPARISON:  10/19/2008  FINDINGS: Opacity obscures the hemidiaphragms and portions of the heart borders, right greater than left. This consistent with effusions with lung base opacity that may reflect atelectasis or infiltrate. There is vascular congestion centrally.  No pneumothorax.  IMPRESSION: 1. Right greater than left pleural effusions with associated parenchymal opacity, which may reflect atelectasis or infiltrate. There is vascular congestion without overt edema.   Electronically Signed   By: Amie Portlandavid  Ormond M.D.   On: 12/10/2013 10:56    Medications: Scheduled Meds: . allopurinol  300 mg Oral Daily  . antiseptic oral rinse  15 mL Mouth Rinse q12n4p  . aspirin EC  81 mg Oral Daily  . atorvastatin  10 mg Oral q1800  . chlorhexidine  15 mL Mouth Rinse BID  . furosemide  80 mg Intravenous Q12H  . gabapentin  300 mg Oral QHS  . heparin  5,000 Units Subcutaneous Q8H  . losartan  100 mg Oral Daily  . metoprolol tartrate  100 mg Oral q morning - 10a   And  . metoprolol tartrate  50 mg Oral QPM  . mometasone-formoterol  2 puff Inhalation BID  . sodium chloride  3 mL Intravenous Q12H      LOS: 1 day   RAI,RIPUDEEP M.D. [facility] 12/11/2013, 12:44 PM Pager: [phone]  If 7PM-7AM, please contact night-coverage [email or web address] Password [email or web address]

## 2013-12-11 NOTE — Progress Notes (Signed)
UR chart review completed.  

## 2013-12-11 NOTE — Progress Notes (Signed)
CRITICAL VALUE ALERT  Critical value received:  Troponin 0.32  Date of notification:  12/11/13  Time of notification:  0157  Critical value read back: yes  Nurse who received alert:  Foye Deer. Jackson Fetters RN  MD notified (1st page):  Dr. Barnie DelG. Krishnan  Time of first page:  0201  MD notified (2nd page):  Time of second page:  Responding MD:    Time MD responded: not at this time. No action taken.

## 2013-12-11 NOTE — Progress Notes (Signed)
*  PRELIMINARY RESULTS* Echocardiogram 2D Echocardiogram has been performed.  Paula Fletcher 12/11/2013, 9:15 AM

## 2013-12-12 ENCOUNTER — Inpatient Hospital Stay (HOSPITAL_COMMUNITY): Payer: Medicare FFS

## 2013-12-12 LAB — PROTEIN ELECTROPHORESIS, SERUM
Albumin ELP: 51.5 % — ABNORMAL LOW (ref 55.8–66.1)
Alpha-1-Globulin: 8 % — ABNORMAL HIGH (ref 2.9–4.9)
Alpha-2-Globulin: 14.4 % — ABNORMAL HIGH (ref 7.1–11.8)
BETA 2: 6.7 % — AB (ref 3.2–6.5)
Beta Globulin: 7.2 % (ref 4.7–7.2)
Gamma Globulin: 12.2 % (ref 11.1–18.8)
M-SPIKE, %: NOT DETECTED g/dL
TOTAL PROTEIN ELP: 5.5 g/dL — AB (ref 6.0–8.3)

## 2013-12-12 LAB — BASIC METABOLIC PANEL
BUN: 44 mg/dL — ABNORMAL HIGH (ref 6–23)
CHLORIDE: 102 meq/L (ref 96–112)
CO2: 39 mEq/L — ABNORMAL HIGH (ref 19–32)
Calcium: 8.8 mg/dL (ref 8.4–10.5)
Creatinine, Ser: 1.4 mg/dL — ABNORMAL HIGH (ref 0.50–1.10)
GFR calc non Af Amer: 35 mL/min — ABNORMAL LOW (ref >90)
GFR, EST AFRICAN AMERICAN: 41 mL/min — AB (ref >90)
Glucose, Bld: 113 mg/dL — ABNORMAL HIGH (ref 70–99)
POTASSIUM: 4 meq/L (ref 3.7–5.3)
Sodium: 148 mEq/L — ABNORMAL HIGH (ref 137–147)

## 2013-12-12 LAB — PATHOLOGIST SMEAR REVIEW

## 2013-12-12 MED ORDER — FUROSEMIDE 10 MG/ML IJ SOLN
60.0000 mg | Freq: Two times a day (BID) | INTRAMUSCULAR | Status: DC
  Administered 2013-12-13: 60 mg via INTRAVENOUS
  Filled 2013-12-12: qty 6

## 2013-12-12 MED ORDER — IOHEXOL 350 MG/ML SOLN
80.0000 mL | Freq: Once | INTRAVENOUS | Status: AC | PRN
  Administered 2013-12-12: 80 mL via INTRAVENOUS

## 2013-12-12 MED ORDER — LEVALBUTEROL HCL 0.63 MG/3ML IN NEBU: 0.6300 mg | INHALATION_SOLUTION | Freq: Three times a day (TID) | RESPIRATORY_TRACT | Status: DC

## 2013-12-12 MED ORDER — CEFTRIAXONE SODIUM 1 G IJ SOLR
1.0000 g | INTRAMUSCULAR | Status: DC
  Administered 2013-12-12 – 2013-12-15 (×4): 1 g via INTRAVENOUS
  Filled 2013-12-12 (×7): qty 10

## 2013-12-12 MED ORDER — METOPROLOL TARTRATE 25 MG PO TABS
12.5000 mg | ORAL_TABLET | Freq: Two times a day (BID) | ORAL | Status: DC
Start: 2013-12-12 — End: 2013-12-13
  Administered 2013-12-12: 12.5 mg via ORAL
  Filled 2013-12-12: qty 1

## 2013-12-12 MED ORDER — METOPROLOL TARTRATE 25 MG PO TABS
25.0000 mg | ORAL_TABLET | Freq: Two times a day (BID) | ORAL | Status: DC
  Administered 2013-12-12: 25 mg via ORAL
  Filled 2013-12-12: qty 1

## 2013-12-12 MED ORDER — LEVALBUTEROL HCL 0.63 MG/3ML IN NEBU
0.6300 mg | INHALATION_SOLUTION | Freq: Three times a day (TID) | RESPIRATORY_TRACT | Status: DC
  Administered 2013-12-13 – 2013-12-18 (×17): 0.63 mg via RESPIRATORY_TRACT
  Filled 2013-12-12 (×17): qty 3

## 2013-12-12 MED ORDER — LEVALBUTEROL HCL 0.63 MG/3ML IN NEBU
0.6300 mg | INHALATION_SOLUTION | Freq: Four times a day (QID) | RESPIRATORY_TRACT | Status: DC
  Administered 2013-12-12 (×2): 0.63 mg via RESPIRATORY_TRACT
  Filled 2013-12-12 (×2): qty 3

## 2013-12-12 MED ORDER — AZITHROMYCIN 500 MG IV SOLR
500.0000 mg | INTRAVENOUS | Status: DC
  Administered 2013-12-12 – 2013-12-15 (×4): 500 mg via INTRAVENOUS
  Filled 2013-12-12 (×8): qty 500

## 2013-12-12 MED ORDER — FUROSEMIDE 10 MG/ML IJ SOLN
40.0000 mg | Freq: Two times a day (BID) | INTRAMUSCULAR | Status: DC
Start: 2013-12-12 — End: 2013-12-12

## 2013-12-12 MED ORDER — ALLOPURINOL 100 MG PO TABS
100.0000 mg | ORAL_TABLET | Freq: Every day | ORAL | Status: DC
  Administered 2013-12-12 – 2013-12-18 (×7): 100 mg via ORAL
  Filled 2013-12-12 (×7): qty 1

## 2013-12-12 NOTE — Evaluation (Signed)
Physical Therapy Evaluation Patient Details Name: Paula Fletcher MRN: [id number] DOB: [birth year] Today's Date: 12/12/2013 Time: 8295-6213 PT Time Calculation (min): 45 min  PT Assessment / Plan / Recommendation History of Present Illness  Past medical history of COPD, questionable heart failure unknown etiology, that comes in for shortness of breath this started one week prior to admission progressively getting worse to the point where she can even walk up the stairs without getting short of breath. She relates is been gradually getting worst she would have been started on Advair and albuterol without any relief. She's experienced some cough feels fatigued, she's also noticed swelling of the legs  Clinical Impression  Pt has orders for compression therapy for B LE.  Pt states that she has been going to the wound center since [place] of 2014.  She states she initially had weeping wounds but the wounds have now healed and HH comes to her house on Wed. To change her compression bandages except for the first Wed. Of the month when she goes to see the wound MD in [place].  Pt was to go to the MD on Wed. But has been admitted into ICU.  She has been referred for compression garment.  Pt swelling is decreased and is ready to go into compression garment but pt states she has allergies and is not able to wear them.  I will keep profore,(what wound center in [place] is putting on Pt) once a week and let the wound MD in [place] follow up at [place].  Lt LE is warm, red and swollen    PT Assessment  Patient needs continued PT services    Follow Up Recommendations  Home health PT    Does the patient have the potential to tolerate intense rehabilitation    N/A  Barriers to Discharge  NA      Equipment Recommendations    Pt states she has walker and cane at home but does not use them she states she does not walk much at home and furniture walks.  Therapist explained that this was not safe but pt seems set in her way.    Recommendations for Other Services  Order for ambulation may be beneficial when pt is appropriate.  Frequency Min 1X/week    Precautions / Restrictions   respiratory  Pertinent Vitals/Pain None noted      Mobility    not assessed due to order being for compression wrapping       PT Diagnosis:   Edema B LE Lt greater than right with hx of weeping and open wounds PT Problem List: cellulitis PT Treatment Interventions:   compression wrapping multilayer    PT Goals(Current goals can be found in the care plan section) Additional Goals Additional Goal #1: Pt edema to remain in control while in hospital  Visit Information  Last PT Received On: 12/12/13 History of Present Illness: Past medical history of COPD, questionable heart failure unknown etiology, that comes in for shortness of breath this started one week prior to admission progressively getting worse to the point where she can even walk up the stairs without getting short of breath. She relates is been gradually getting worst she would have been started on Advair and albuterol without any relief. She's experienced some cough feels fatigued, she's also noticed swelling of the legs       Prior Functioning   Pt works at daughters [employer] stating that they have a cart waiting for her that she uses to get  inside and sits most of the day.  Pt states she sits at home only walking room to room with furniture walkng.    Cognition    wnl   Extremity/Trunk Assessment   weak (pt has difficulty moving LE for bandaging)  Balance  not assessed.  End of Session PT - End of Session Patient left: in bed  GP     RUSSELL,CINDY 12/12/2013, 11:33 AM

## 2013-12-12 NOTE — Progress Notes (Signed)
Patient ID: Paula Fletcher  female  WUJ:[id number]    DOB: Feb 01, 1935    DOA: 12/10/2013  PCP: Ninfa Linden, FNP  Assessment/Plan:  Primary problem: Acute hypoxic respiratory failure: Likely due to acute CHF, bilateral pleural effusions, bilbasilar PNA, COPD, off BiPAP 1. Acute diastolic CHF  elevated BNP 5669, lower extremity edema - prior echo in 2003, no recent ischemic workup - Continue IV diuresis, BP borderline hypotensive, creatinine trending up to 1.4, Lasix decreased to 60 mg q12hrs - negative 2.3 L, however weight went up from 249 ->251 lbs despite diuresis - 2-D echo done, EF 60-65% with grade 1 diastolic dysfunction, mild to moderate mitral stenosis - CXR with bilateral pleural effusions, with recurrent right-sided pleural effusion, status post right-sided thoracentesis  - cardiology consulted, HOLD losartan due to Cr trending up, dec BB DUE to borderline hypotension with bradycardia  - CT angiogram of the chest done today showed no pulmonary embolism, bibasilar pneumonia with bilateral pleural effusion, R>L, no mass or tumor   2) bibasilar pneumonia with COPD, CAP - CT chest shows bibasilar pneumonia, bilateral pleural effusion, start on a Zithromax and Rocephin - Continue Xopenex nebs, Dulera   Essential hypertension  - BP seems to be borderline hypotensive, discontinued losartan, decrease beta blocker, continue IV Lasix. Discontinue Procardia - Cardiology consulted  COPD (chronic obstructive pulmonary disease)  - Continue Advair and albuterol when necessary.   New onset Atrial fibrillation  - Currently rate controlled, continue metoprolol.  -Cardiology consulted, anticoagulation deferred to cardiology  Recurrent right pleural effusion: probable cause for her pleural effusion is likely acute decompensated heart failure.  - Unclear etiology at this time, was placed on BiPAP, s/p thoracentesis, studies not consistent with empyema/infection   peripheral edema: Likely  due to Acute CHF- improving - Continue Lasix, patient was getting wound care/ wraps weekly.    DVT Prophylaxis:  Code Status:  Family Communication: Patient alert and oriented, discussed with patient in detail  Disposition: cont SDU    Subjective: feeling better, off BiPAP, still on partial NRB, FIO2 now 60%  Objective: Weight change: 0.9 kg (1 lb 15.7 oz)  Intake/Output Summary (Last 24 hours) at 12/12/13 1235 Last data filed at 12/12/13 1119  Gross per 24 hour  Intake    613 ml  Output   2550 ml  Net  -1937 ml   Blood pressure 120/73, pulse 42, temperature 97.5 F (36.4 C), temperature source Oral, resp. rate 17, height 5\' 8"  (1.727 m), weight 113.9 kg (251 lb 1.7 oz), SpO2 95.00%.  Physical Exam: General: Alert and awake, oriented x3, slightly improving today CVS: S1-S2 clear,  irregular Chest: decreased breath sounds at the bases, slight wheezing  Abdomen: soft nontender, nondistended, normal bowel sounds  Extremities: no cyanosis, clubbing. 1-2+ edema Neuro: Cranial nerves II-XII intact, no focal neurological deficits  Lab Results: Basic Metabolic Panel:  Recent Labs Lab 12/10/13 1130 12/11/13 0654 12/12/13 0459  NA 144 146 148*  K 4.0 4.0 4.0  CL 101 102 102  CO2 32 34* 39*  GLUCOSE 132* 141* 113*  BUN 23 35* 44*  CREATININE 1.11* 1.37* 1.40*  CALCIUM 9.1 9.0 8.8  MG 1.9  --   --    Liver Function Tests:  Recent Labs Lab 12/10/13 1130  AST 23  ALT 21  ALKPHOS 101  BILITOT 0.6  PROT 6.6  ALBUMIN 3.1*   No results found for this basename: LIPASE, AMYLASE,  in the last 168 hours No results found for this basename:  AMMONIA,  in the last 168 hours CBC:  Recent Labs Lab 12/10/13 1052  WBC 6.4  NEUTROABS 4.3  HGB 14.5  HCT 46.6*  MCV 104.3*  PLT 185   Cardiac Enzymes:  Recent Labs Lab 12/10/13 1834 12/11/13 0045 12/11/13 0654  TROPONINI <0.30 0.32* <0.30   BNP: No components found with this basename: POCBNP,  CBG: No results  found for this basename: GLUCAP,  in the last 168 hours   Micro Results: Recent Results (from the past 240 hour(s))  URINE CULTURE     Status: None   Collection Time    12/10/13 10:58 AM      Result Value Range Status   Specimen Description URINE, CLEAN CATCH   Final   Special Requests NONE   Final   Culture  Setup Time     Final   Value: 12/10/2013 21:46     Performed at [facility]   Colony Count     Final   Value: >=100,000 COLONIES/ML     Performed at [facility]   Culture     Final   Value: ESCHERICHIA COLI     Performed at [facility]   Report Status PENDING   Incomplete  MRSA PCR SCREENING     Status: None   Collection Time    12/10/13  5:55 PM      Result Value Range Status   MRSA by PCR NEGATIVE  NEGATIVE Final   Comment:            The GeneXpert MRSA Assay (FDA     approved for NASAL specimens     only), is one component of a     comprehensive MRSA colonization     surveillance program. It is not     intended to diagnose MRSA     infection nor to guide or     monitor treatment for     MRSA infections.  BODY FLUID CULTURE     Status: None   Collection Time    12/11/13  1:52 PM      Result Value Range Status   Specimen Description FLUID RIGHT PLEURAL   Final   Special Requests NONE   Final   Gram Stain     Final   Value: ABUNDANT WBC PRESENT, PREDOMINANTLY MONONUCLEAR     NO ORGANISMS SEEN     Performed at [facility]     Performed at [facility]   Culture     Final   Value: NO GROWTH     Performed at [facility]   Report Status PENDING   Incomplete    Studies/Results: Dg Chest Port 1 View  12/11/2013   CLINICAL DATA:  Shortness of breath  EXAM: PORTABLE CHEST - 1 VIEW  COMPARISON:  12/10/2013  FINDINGS: The cardiac shadow is stable. Bilateral pleural effusions are again seen. Mild vascular congestion is also again seen.  IMPRESSION: No change from the previous exam. Vascular congestion and pleural  effusions are stable.   Electronically Signed   By: Alcide CleverMark  Lukens M.D.   On: 12/11/2013 09:39   Dg Chest Portable 1 View  12/10/2013   CLINICAL DATA:  Bilateral leg swelling.  Productive cough.  EXAM: PORTABLE CHEST - 1 VIEW  COMPARISON:  10/19/2008  FINDINGS: Opacity obscures the hemidiaphragms and portions of the heart borders, right greater than left. This consistent with effusions with lung base opacity that may reflect atelectasis or infiltrate. There is vascular  congestion centrally.  No pneumothorax.  IMPRESSION: 1. Right greater than left pleural effusions with associated parenchymal opacity, which may reflect atelectasis or infiltrate. There is vascular congestion without overt edema.   Electronically Signed   By: Amie Portland M.D.   On: 12/10/2013 10:56    Medications: Scheduled Meds: . allopurinol  100 mg Oral Daily  . antiseptic oral rinse  15 mL Mouth Rinse q12n4p  . aspirin EC  81 mg Oral Daily  . atorvastatin  10 mg Oral q1800  . furosemide  60 mg Intravenous Q12H  . gabapentin  300 mg Oral QHS  . heparin  5,000 Units Subcutaneous Q8H  . levalbuterol  0.63 mg Nebulization Q8H  . metoprolol tartrate  12.5 mg Oral BID  . mometasone-formoterol  2 puff Inhalation BID  . sodium chloride  3 mL Intravenous Q12H      LOS: 2 days   Demarko Zeimet M.D. [facility] 12/12/2013, 12:35 PM Pager: [phone]  If 7PM-7AM, please contact night-coverage [email or web address] Password [email or web address]

## 2013-12-12 NOTE — Clinical Documentation Improvement (Signed)
  Please clarify type of "Acute CHF". Thank you. Possible Clinical Conditions?  Acute Systolic Congestive Heart Failure Acute Diastolic Congestive Heart Failure Acute Systolic & Diastolic Congestive Heart Failure Other Condition________________________________________ Cannot Clinically Determine  Supporting Information:  Risk Factors: New onset A Fib Acute respiratory failure History of hypertension "Acute decompensated heart failure" noted in H&P  Signs & Symptoms: Shortness of breath Lower extremity edema  Diagnostics: BNP:  5669 CXR = Bilateral pleural effusions 2/2 Echo= EF 60-65%  Treatment: IV Lasix Strict I&O Daily weights  Thank You, Harless Littenebora T Chelsey Redondo ,RN Clinical Documentation Specialist:  [phone]  [facility]- Health Information Management

## 2013-12-13 ENCOUNTER — Encounter (HOSPITAL_COMMUNITY): Payer: Self-pay | Admitting: Internal Medicine

## 2013-12-13 DIAGNOSIS — I5032 Chronic diastolic (congestive) heart failure: Secondary | ICD-10-CM | POA: Diagnosis present

## 2013-12-13 DIAGNOSIS — J90 Pleural effusion, not elsewhere classified: Secondary | ICD-10-CM | POA: Diagnosis present

## 2013-12-13 DIAGNOSIS — J189 Pneumonia, unspecified organism: Secondary | ICD-10-CM

## 2013-12-13 DIAGNOSIS — I5033 Acute on chronic diastolic (congestive) heart failure: Principal | ICD-10-CM

## 2013-12-13 DIAGNOSIS — N390 Urinary tract infection, site not specified: Secondary | ICD-10-CM | POA: Diagnosis present

## 2013-12-13 DIAGNOSIS — N289 Disorder of kidney and ureter, unspecified: Secondary | ICD-10-CM | POA: Diagnosis not present

## 2013-12-13 DIAGNOSIS — I878 Other specified disorders of veins: Secondary | ICD-10-CM | POA: Diagnosis present

## 2013-12-13 LAB — T4, FREE: FREE T4: 0.99 ng/dL (ref 0.80–1.80)

## 2013-12-13 LAB — BASIC METABOLIC PANEL
BUN: 38 mg/dL — ABNORMAL HIGH (ref 6–23)
CHLORIDE: 101 meq/L (ref 96–112)
CO2: 34 mEq/L — ABNORMAL HIGH (ref 19–32)
Calcium: 9 mg/dL (ref 8.4–10.5)
Creatinine, Ser: 1.18 mg/dL — ABNORMAL HIGH (ref 0.50–1.10)
GFR calc Af Amer: 51 mL/min — ABNORMAL LOW (ref >90)
GFR calc non Af Amer: 44 mL/min — ABNORMAL LOW (ref >90)
Glucose, Bld: 111 mg/dL — ABNORMAL HIGH (ref 70–99)
POTASSIUM: 4.2 meq/L (ref 3.7–5.3)
SODIUM: 145 meq/L (ref 137–147)

## 2013-12-13 LAB — URINE CULTURE: Colony Count: >=100000

## 2013-12-13 LAB — CBC
HEMATOCRIT: 46.3 % — AB (ref 36.0–46.0)
HEMOGLOBIN: 14.3 g/dL (ref 12.0–15.0)
MCH: 32.5 pg (ref 26.0–34.0)
MCHC: 30.9 g/dL (ref 30.0–36.0)
MCV: 105.2 fL — AB (ref 78.0–100.0)
PLATELETS: 159 10*3/uL (ref 150–400)
RBC: 4.4 MIL/uL (ref 3.87–5.11)
RDW: 16.6 % — ABNORMAL HIGH (ref 11.5–15.5)
WBC: 6.2 10*3/uL (ref 4.0–10.5)

## 2013-12-13 LAB — TSH: TSH: 3.781 u[IU]/mL (ref 0.350–4.500)

## 2013-12-13 LAB — VITAMIN B12: Vitamin B-12: 622 pg/mL (ref 211–911)

## 2013-12-13 MED ORDER — NIFEDIPINE ER OSMOTIC RELEASE 30 MG PO TB24
30.0000 mg | ORAL_TABLET | Freq: Every day | ORAL | Status: DC
  Administered 2013-12-13 – 2013-12-17 (×5): 30 mg via ORAL
  Filled 2013-12-13 (×5): qty 1

## 2013-12-13 MED ORDER — WARFARIN VIDEO
Freq: Once | Status: AC
  Administered 2013-12-13: 12:00:00

## 2013-12-13 MED ORDER — WARFARIN - PHARMACIST DOSING INPATIENT
Status: DC
  Administered 2013-12-13: 1
  Administered 2013-12-14: 16:00:00

## 2013-12-13 MED ORDER — LOSARTAN POTASSIUM 50 MG PO TABS
25.0000 mg | ORAL_TABLET | Freq: Every day | ORAL | Status: DC
  Administered 2013-12-13: 25 mg via ORAL
  Filled 2013-12-13: qty 1

## 2013-12-13 MED ORDER — WARFARIN SODIUM 5 MG PO TABS
5.0000 mg | ORAL_TABLET | Freq: Once | ORAL | Status: AC
  Administered 2013-12-13: 5 mg via ORAL
  Filled 2013-12-13: qty 1

## 2013-12-13 MED ORDER — HYDROCODONE-ACETAMINOPHEN 5-325 MG PO TABS
0.5000 | ORAL_TABLET | Freq: Three times a day (TID) | ORAL | Status: DC | PRN
  Administered 2013-12-13 – 2013-12-15 (×3): 0.5 via ORAL
  Filled 2013-12-13 (×2): qty 1

## 2013-12-13 MED ORDER — METOPROLOL TARTRATE 50 MG PO TABS
75.0000 mg | ORAL_TABLET | Freq: Two times a day (BID) | ORAL | Status: DC
  Administered 2013-12-13 (×2): 75 mg via ORAL
  Filled 2013-12-13 (×4): qty 1

## 2013-12-13 MED ORDER — PATIENT'S GUIDE TO USING COUMADIN BOOK
Freq: Once | Status: AC
  Administered 2013-12-13: 1
  Filled 2013-12-13: qty 1

## 2013-12-13 MED ORDER — FUROSEMIDE 40 MG PO TABS
40.0000 mg | ORAL_TABLET | Freq: Two times a day (BID) | ORAL | Status: DC
Start: 2013-12-14 — End: 2013-12-14
  Administered 2013-12-13 – 2013-12-14 (×2): 40 mg via ORAL
  Filled 2013-12-13 (×3): qty 1

## 2013-12-13 MED ORDER — MORPHINE SULFATE 2 MG/ML IJ SOLN: 1.0000 mg | INTRAMUSCULAR | Status: DC | PRN

## 2013-12-13 MED ORDER — MORPHINE SULFATE 2 MG/ML IJ SOLN
0.5000 mg | INTRAMUSCULAR | Status: DC | PRN
  Administered 2013-12-13 – 2013-12-14 (×2): 0.5 mg via INTRAVENOUS
  Filled 2013-12-13 (×2): qty 1

## 2013-12-13 NOTE — Progress Notes (Signed)
[facility] PROGRESS NOTE  Paula Fletcher ZOX:[id number] DOB: [birth year] DOA: 12/10/2013 PCP: Ninfa Linden, FNP    Code Status: Full code Family Communication: Family not available. Disposition Plan: To be determined.   Consultants:  None  Procedures: 1.) 12/11/13.  2-D echocardiogram:Study Conclusions  - Procedure narrative: Transthoracic echocardiography. Image quality was suboptimal. The study was technically difficult, as a result of poor sound wave transmission and body habitus. - Left ventricle: The cavity size was normal. Wall thickness was increased in a pattern of moderate LVH. Systolic function was normal. The estimated ejection fraction was in the range of 60% to 65%. Doppler parameters are consistent with abnormal left ventricular relaxation (grade 1 diastolic dysfunction). Doppler parameters are consistent with both elevated ventricular end-diastolic filling pressure and elevated left atrial filling pressure. - Aortic valve: Mildly calcified annulus. Mildly thickened, mildly calcified leaflets. There wasborderline stenosis. - Mitral valve: Mild to moderate mitral stenosis. Calcified annulus. Mildly thickened leaflets . Trivial regurgitation. Mean gradient: 8mm Hg (D). Valve area by pressure half-time: 1.73cm^2. - Left atrium: The atrium was moderately to severely dilated. - Right atrium: The atrium was mildly dilated. - Pulmonary arteries: Inadequate spectral Doppler profile to accurately assess pulmonary pressures.  2.) 12/10/13 ultrasound-guided thoracentesis, yielding 10 cc of yellow fluid. 3.) BiPAP 12/10/13.  Antibiotics:  Azithromycin 12/12/13>>  Rocephin 12/12/13>>  HPI/Subjective: The patient is sitting up in bed, eating breakfast. She has less shortness of breath. She denies chest pain. She has a mild nonproductive cough.  Objective: Filed Vitals:   12/13/13 0844  BP: 161/87  Pulse: 66  Temp:   Resp:    Blood pressure (during exam  in the room) 145/64. Heart rate 78. Oxygen saturation 90% on nonrebreather (78% on 4 L). Respiratory rate 18.   Intake/Output Summary (Last 24 hours) at 12/13/13 0853 Last data filed at 12/13/13 0730  Gross per 24 hour  Intake    303 ml  Output   3575 ml  Net  -3272 ml   Filed Weights   12/11/13 0500 12/12/13 0500 12/13/13 0500  Weight: 113.4 kg (250 lb) 113.9 kg (251 lb 1.7 oz) 114.6 kg (252 lb 10.4 oz)    Exam:  General:  Pleasant obese 78 roll Caucasian woman sitting up in bed, in no acute distress. Lungs: Clear in the upper lobes and decreased breath sounds in the bases. No active audible wheezes. Breathing is nonlabored. Heart: Irregular, irregular. Abdomen: Positive bowel sounds, soft, mildly obese, nontender, nondistended. Extremities/musculoskeletal: Trace of bilateral lower extremity edema. Dressing/wraps in place bilaterally. (Not taken off). Neurologic: She is alert and oriented to herself and hospital. Her speech is clear. Cranial nerves II through XII are grossly intact.  Data Reviewed: Basic Metabolic Panel:  Recent Labs Lab 12/10/13 1130 12/11/13 0654 12/12/13 0459 12/13/13 0533  NA 144 146 148* 145  K 4.0 4.0 4.0 4.2  CL 101 102 102 101  CO2 32 34* 39* 34*  GLUCOSE 132* 141* 113* 111*  BUN 23 35* 44* 38*  CREATININE 1.11* 1.37* 1.40* 1.18*  CALCIUM 9.1 9.0 8.8 9.0  MG 1.9  --   --   --    Liver Function Tests:  Recent Labs Lab 12/10/13 1130  AST 23  ALT 21  ALKPHOS 101  BILITOT 0.6  PROT 6.6  ALBUMIN 3.1*   No results found for this basename: LIPASE, AMYLASE,  in the last 168 hours No results found for this basename: AMMONIA,  in the last 168 hours CBC:  Recent  Labs Lab 12/10/13 1052  WBC 6.4  NEUTROABS 4.3  HGB 14.5  HCT 46.6*  MCV 104.3*  PLT 185   Cardiac Enzymes:  Recent Labs Lab 12/10/13 1213 12/10/13 1834 12/11/13 0045 12/11/13 0654  TROPONINI <0.30 <0.30 0.32* <0.30   BNP (last 3 results)  Recent Labs   12/10/13 1130  PROBNP 5669.0*   CBG: No results found for this basename: GLUCAP,  in the last 168 hours  Recent Results (from the past 240 hour(s))  URINE CULTURE     Status: None   Collection Time    12/10/13 10:58 AM      Result Value Range Status   Specimen Description URINE, CLEAN CATCH   Final   Special Requests NONE   Final   Culture  Setup Time     Final   Value: 12/10/2013 21:46     Performed at [facility] Count     Final   Value: >=100,000 COLONIES/ML     Performed at [facility]   Culture     Final   Value: ESCHERICHIA COLI     Performed at [facility]   Report Status PENDING   Incomplete  MRSA PCR SCREENING     Status: None   Collection Time    12/10/13  5:55 PM      Result Value Range Status   MRSA by PCR NEGATIVE  NEGATIVE Final   Comment:            The GeneXpert MRSA Assay (FDA     approved for NASAL specimens     only), is one component of a     comprehensive MRSA colonization     surveillance program. It is not     intended to diagnose MRSA     infection nor to guide or     monitor treatment for     MRSA infections.  BODY FLUID CULTURE     Status: None   Collection Time    12/11/13  1:52 PM      Result Value Range Status   Specimen Description FLUID RIGHT PLEURAL   Final   Special Requests NONE   Final   Gram Stain     Final   Value: CYTOSPIN ABUNDANT WBC PRESENT, PREDOMINANTLY MONONUCLEAR     NO ORGANISMS SEEN     Performed at [facility]     Performed at [facility]   Culture     Final   Value: NO GROWTH     Performed at [facility]   Report Status PENDING   Incomplete     Studies: Ct Angio Chest Pe W/cm &/or Wo Cm  12/12/2013   CLINICAL DATA:  Hypoxia, acute respiratory failure, history of COPD  EXAM: CT ANGIOGRAPHY CHEST WITH CONTRAST  TECHNIQUE: Multidetector CT imaging of the chest was performed using the standard protocol during bolus administration of intravenous contrast.  Multiplanar CT image reconstructions including MIPs were obtained to evaluate the vascular anatomy.  CONTRAST:  80mL OMNIPAQUE IOHEXOL 350 MG/ML SOLN  COMPARISON:  DG CHEST PORT 1VSAME DAY dated 12/11/2013  FINDINGS: Contrast within the pulmonary arterial tree is normal in appearance. There are no filling defects to suggest acute pulmonary embolism. There is a moderate-sized right pleural effusion and smaller left pleural effusion. There is dense infiltrate within both lower lobes posteriorly. The upper lobes are spared. The cardiac chambers are top-normal in size. There is no pericardial effusion. The caliber  of the thoracic aorta is normal. No bulky mediastinal or hilar lymph nodes are evident.  The observed portions of the thoracic spine exhibit no acute abnormalities. There is degenerative disc change inferiorly. The sternum appears intact. Within the upper abdomen the observed portions of the liver and spleen appear normal.  Review of the MIP images confirms the above findings.  IMPRESSION: 1. There is no evidence of an acute pulmonary embolism or acute thoracic aortic pathology. 2. Bibasilar pneumonia is present with a moderate-sized right pleural effusion and smaller left pleural effusion. 3. There is no evidence of CHF. No bulky mediastinal or hilar lymph nodes are evident.   Electronically Signed   By: David  SwazilandJordan   On: 12/12/2013 11:36   Dg Chest Port 1 View  12/11/2013   CLINICAL DATA:  Shortness of breath  EXAM: PORTABLE CHEST - 1 VIEW  COMPARISON:  12/10/2013  FINDINGS: The cardiac shadow is stable. Bilateral pleural effusions are again seen. Mild vascular congestion is also again seen.  IMPRESSION: No change from the previous exam. Vascular congestion and pleural effusions are stable.   Electronically Signed   By: Alcide CleverMark  Lukens M.D.   On: 12/11/2013 09:39   Dg Chest Port 1v Same Day  12/11/2013   CLINICAL DATA:  Post thoracentesis  EXAM: PORTABLE CHEST - 1 VIEW SAME DAY  COMPARISON:  12/11/2013 at  0916 hr  FINDINGS: Small bilateral pleural effusions, grossly unchanged. Right pleural effusion has decreased from 12/10/2013.  No pneumothorax is seen status post right thoracentesis.  Associated lower lobe opacities, right greater than left, favored to reflect atelectasis. Right lower lobe pneumonia is not excluded.  Cardiomegaly with mild interstitial edema.  IMPRESSION: No pneumothorax is seen status post right thoracentesis.  Otherwise unchanged.   Electronically Signed   By: Charline BillsSriyesh  Krishnan M.D.   On: 12/11/2013 14:13   [place] Thoracentesis Asp Pleural Space W/img Guide  12/11/2013   CLINICAL DATA:  Pleural effusion  EXAM: ULTRASOUND GUIDED RIGHT THORACENTESIS  COMPARISON:  None.  PROCEDURE: An ultrasound guided thoracentesis was thoroughly discussed with the patient and questions answered. The benefits, risks, alternatives and complications were also discussed. The patient understands and wishes to proceed with the procedure. Written consent was obtained.  Ultrasound was performed to localize and mark an adequate pocket of fluid in the right chest. The area was then prepped and draped in the normal sterile fashion. 1% Lidocaine was used for local anesthesia. Under ultrasound guidance a 19 gauge Yueh catheter was introduced. Thoracentesis was performed. The catheter was removed and a dressing applied.  Complications:  None.  FINDINGS: While the patient had at least a small pleural effusion, on real-time imaging, atelectatic lung was mobile within the fluid pocket, at times extending near the skin surface.  As a result, only a small amount of pleural fluid could be safely removed.  A total of approximately 10 mL of yellow fluid was removed. A fluid sample was maintained for possible laboratory analysis is desired.  IMPRESSION: Successful ultrasound guided right thoracentesis yielding 10 mL of pleural fluid.  Additional fluid could not be safely withdrawn at this time due to adjacent atelectatic lung.    Electronically Signed   By: Charline BillsSriyesh  Krishnan M.D.   On: 12/11/2013 14:27    Scheduled Meds: . allopurinol  100 mg Oral Daily  . antiseptic oral rinse  15 mL Mouth Rinse q12n4p  . aspirin EC  81 mg Oral Daily  . atorvastatin  10 mg Oral q1800  .  azithromycin  500 mg Intravenous Q24H  . cefTRIAXone (ROCEPHIN)  IV  1 g Intravenous Q24H  . furosemide  60 mg Intravenous Q12H  . gabapentin  300 mg Oral QHS  . heparin  5,000 Units Subcutaneous Q8H  . levalbuterol  0.63 mg Nebulization TID  . metoprolol tartrate  12.5 mg Oral BID  . mometasone-formoterol  2 puff Inhalation BID  . sodium chloride  3 mL Intravenous Q12H   Continuous Infusions:    Assessment: Principal Problem:   Acute respiratory failure Active Problems:   Pleural effusion, bilateral   CAP (community acquired pneumonia)   Acute on chronic diastolic heart failure   Essential hypertension   COPD (chronic obstructive pulmonary disease)   Atrial fibrillation   UTI (urinary tract infection)   Lower extremity venous stasis   Acute renal insufficiency   1. Acute respiratory failure with hypoxia; secondary to a combination of bilateral pleural effusions, diastolic heart failure, and by basilar pneumonia. She is still oxygen dependent and may require supplemental oxygen at the time of discharge. CT angiogram essentially negative for PE.  Bilateral pleural effusions. I believe the effusions are secondary to both pneumonia and and does parapneumonic and decompensated diastolic heart failure. Thoracentesis only yielded 10 cc of yellow fluid; per radiology, anatomical concern limited further drainage. She is diuresed over 5.5 L to date.  Acute on chronic diastolic heart failure. She is noted to have grade 1 diastolic dysfunction and ejection fraction of 60-65%. Also, her left atrium is moderately to severely dilated. As above, she is diuresed over 5.5 L to date. She is approaching compensation clinically. Will transition Lasix to  by mouth.  Newly diagnosed atrial fibrillation. Given the patient's heart failure, hypertension, and age, will favor anticoagulation. This was discussed in a curbside consultation with Dr. Purvis Sheffield who agrees. We'll start Coumadin. This was discussed with the patient.  Hypertension. Her blood pressure is not controlled this morning. Some of her medications are on hold and the dose of metoprolol was decreased. The metoprolol will be increased and her other antihypertensive medications adjusted and/or restarted.  Bibasilar pneumonia. We'll continue Rocephin and azithromycin. Will add incentive spirometry to help with aeration.  COPD. Currently stable on Xopenex and Symbicort.  Urinary tract infection. The patient is asymptomatic. Urine culture grew out Escherichia coli. This is covered with Rocephin.  Acute renal insufficiency; likely secondary to diuresis. Her creatinine is better today.  Bilateral lower extremity edema with venous stasis. She is being treated with compression dressings. This is chronic. It appears that her edema has subsided.  Macrocytosis. We'll order TSH and vitamin B12 level for further evaluation.      Plan: 1. Change Lasix to 40 mg twice a day, orally.  2. Start Coumadin. Will ask pharmacy to adjust dosing and monitor. Discontinue aspirin. 3. Restart losartan at a lower dose. Increase metoprolol to home dose. Restart Procardia. 4. PT consult.     Time spent: 40 minutes.    [facility]  [facility] Pager [phone]. If 7PM-7AM, please contact night-coverage at [email or web address], password [facility] 12/13/2013, 8:53 AM  LOS: 3 days

## 2013-12-13 NOTE — Plan of Care (Signed)
Problem: ICU Phase Progression Outcomes Goal: Pain controlled with appropriate interventions Outcome: Not Progressing Pt only has Qday prn pain med ordered and is in severe pain with her gout flair up, text paged md on call and awaiting a change in orders Goal: Voiding-avoid urinary catheter unless indicated Outcome: Not Progressing Pt still has foley and is needed d/t getting lasix

## 2013-12-13 NOTE — Progress Notes (Signed)
Pt placed back on NRB at 15L after consulting with RT, will cont to monitor

## 2013-12-13 NOTE — Progress Notes (Addendum)
ANTICOAGULATION CONSULT NOTE - Initial Consult  Pharmacy Consult for Coumadin Indication: atrial fibrillation  No Known Allergies  Patient Measurements: Height: 5\' 8"  (172.7 cm) Weight: 252 lb 10.4 oz (114.6 kg) IBW/kg (Calculated) : 63.9  Vital Signs: Temp: 98.3 F (36.8 C) (02/04 0730) Temp src: Oral (02/04 0730) BP: 137/58 mmHg (02/04 1000) Pulse Rate: 89 (02/04 1000)  Labs:  Recent Labs  12/10/13 1130  12/10/13 1834 12/11/13 0045 12/11/13 0654 12/12/13 0459 12/13/13 0533 12/13/13 0904  HGB  --   --   --   --   --   --   --  14.3  HCT  --   --   --   --   --   --   --  46.3*  PLT  --   --   --   --   --   --   --  159  LABPROT 13.4  --   --   --   --   --   --   --   INR 1.04  --   --   --   --   --   --   --   CREATININE 1.11*  --   --   --  1.37* 1.40* 1.18*  --   TROPONINI  --   < > <0.30 0.32* <0.30  --   --   --   < > = values in this interval not displayed.  Estimated Creatinine Clearance: 53.9 ml/min (by C-G formula based on Cr of 1.18).   Medical History: Past Medical History  Diagnosis Date  . COPD (chronic obstructive pulmonary disease)   . Hypertension   . Hyperlipidemia   . Gout   . Poor historian   . Recurrent right pleural effusion 12/10/2013  . CAP (community acquired pneumonia) 12/13/2013  . Acute on chronic diastolic heart failure 12/13/2013  . Lower extremity venous stasis 12/13/2013    Medications:  Scheduled:  . allopurinol  100 mg Oral Daily  . antiseptic oral rinse  15 mL Mouth Rinse q12n4p  . atorvastatin  10 mg Oral q1800  . azithromycin  500 mg Intravenous Q24H  . cefTRIAXone (ROCEPHIN)  IV  1 g Intravenous Q24H  . [START ON 12/14/2013] furosemide  40 mg Oral BID  . gabapentin  300 mg Oral QHS  . heparin  5,000 Units Subcutaneous Q8H  . levalbuterol  0.63 mg Nebulization TID  . losartan  25 mg Oral Daily  . metoprolol tartrate  75 mg Oral BID  . mometasone-formoterol  2 puff Inhalation BID  . NIFEdipine  30 mg Oral QHS  . sodium  chloride  3 mL Intravenous Q12H  . warfarin  5 mg Oral Once  . Warfarin - Pharmacist Dosing Inpatient   Does not apply Q24H    Assessment: 79yo female with new onset atrial fibrillation.  Initiating Coumadin anticoagulation.  Baseline INR = 1.04.  Goal of Therapy:  INR 2-3 Monitor platelets by anticoagulation protocol: Yes   Plan:  Coumadin 5mg  po today x 1 INR daily Continue SQ Heparin until INR at goal Coumadin education  Valrie HartHall, Baraka Klatt A 12/13/2013,11:09 AM

## 2013-12-13 NOTE — Progress Notes (Signed)
Pt appears to be resting comfortably w/ family at bedside, VSS w/ exception of O2sat at 886, will consult with RT, pt c/o pain at 8/10 Lt foot/toes, (see pain assess), went to pull med and realized when i scanned the med that is only ordered Qday prn , pt is in severe pain and needs more than a 1/2 pill Qday, text paged md on call to request change in frequency or something different to treat her pain, low bed, HOB self regulated, call bell at pt's side, will cont to monitor

## 2013-12-14 DIAGNOSIS — N289 Disorder of kidney and ureter, unspecified: Secondary | ICD-10-CM

## 2013-12-14 LAB — CBC
HEMATOCRIT: 43.9 % (ref 36.0–46.0)
HEMOGLOBIN: 13.7 g/dL (ref 12.0–15.0)
MCH: 32.5 pg (ref 26.0–34.0)
MCHC: 31.2 g/dL (ref 30.0–36.0)
MCV: 104 fL — ABNORMAL HIGH (ref 78.0–100.0)
Platelets: 144 10*3/uL — ABNORMAL LOW (ref 150–400)
RBC: 4.22 MIL/uL (ref 3.87–5.11)
RDW: 16.2 % — AB (ref 11.5–15.5)
WBC: 5.8 10*3/uL (ref 4.0–10.5)

## 2013-12-14 LAB — BASIC METABOLIC PANEL
BUN: 38 mg/dL — AB (ref 6–23)
CHLORIDE: 99 meq/L (ref 96–112)
CO2: 35 meq/L — AB (ref 19–32)
Calcium: 9.1 mg/dL (ref 8.4–10.5)
Creatinine, Ser: 1.27 mg/dL — ABNORMAL HIGH (ref 0.50–1.10)
GFR calc non Af Amer: 40 mL/min — ABNORMAL LOW (ref >90)
GFR, EST AFRICAN AMERICAN: 46 mL/min — AB (ref >90)
Glucose, Bld: 132 mg/dL — ABNORMAL HIGH (ref 70–99)
POTASSIUM: 4.8 meq/L (ref 3.7–5.3)
Sodium: 142 mEq/L (ref 137–147)

## 2013-12-14 LAB — PROTIME-INR
INR: 0.98 (ref 0.00–1.49)
Prothrombin Time: 12.8 seconds (ref 11.6–15.2)

## 2013-12-14 MED ORDER — METHYLPREDNISOLONE SODIUM SUCC 40 MG IJ SOLR
40.0000 mg | Freq: Once | INTRAMUSCULAR | Status: AC
  Administered 2013-12-14: 40 mg via INTRAVENOUS
  Filled 2013-12-14: qty 1

## 2013-12-14 MED ORDER — ALUM & MAG HYDROXIDE-SIMETH 200-200-20 MG/5ML PO SUSP: 15.0000 mL | ORAL | Status: DC | PRN

## 2013-12-14 MED ORDER — METOPROLOL TARTRATE 25 MG PO TABS
25.0000 mg | ORAL_TABLET | Freq: Two times a day (BID) | ORAL | Status: DC
  Administered 2013-12-14 – 2013-12-18 (×9): 25 mg via ORAL
  Filled 2013-12-14 (×9): qty 1

## 2013-12-14 MED ORDER — WARFARIN SODIUM 5 MG PO TABS
5.0000 mg | ORAL_TABLET | Freq: Once | ORAL | Status: AC
  Administered 2013-12-14: 5 mg via ORAL
  Filled 2013-12-14: qty 1

## 2013-12-14 MED ORDER — PREDNISONE 20 MG PO TABS
20.0000 mg | ORAL_TABLET | Freq: Every day | ORAL | Status: DC
  Administered 2013-12-14 – 2013-12-18 (×5): 20 mg via ORAL
  Filled 2013-12-14 (×5): qty 1

## 2013-12-14 MED ORDER — FUROSEMIDE 20 MG PO TABS
20.0000 mg | ORAL_TABLET | Freq: Two times a day (BID) | ORAL | Status: DC
  Administered 2013-12-14 – 2013-12-16 (×5): 20 mg via ORAL
  Filled 2013-12-14 (×5): qty 1

## 2013-12-14 NOTE — Progress Notes (Signed)
Pt appears to be resting comfortably at this time, vss although pt has had episodes of asymptomatic bradycardia throughout the shift no c/o pain at this time, low bed, hob @30degrees , call bell at pt's side, will cont to monitor

## 2013-12-14 NOTE — Progress Notes (Signed)
Profore wraps were inspected and found to be intact and comfortable.  No intervention needed today.

## 2013-12-14 NOTE — Progress Notes (Signed)
PT RANSFERING TO ROOM 312 ON TELEMETRY. HR 67. O2 SAT 95% ON O2 AT 6L/MIN VIA [place]. FOLEY CATHETER PATENT DRAING CLEAR YELLOW URINE. RT AC NSL PATENT. NO FUTHER C/O RT GREAT TOE GOUT PAIN. TRANSFER REPORT CALLED TO ABBY RN ON 300.

## 2013-12-14 NOTE — Progress Notes (Signed)
[facility] PROGRESS NOTE  Paula Fletcher ZOX:[id number] DOB: [birth year] DOA: 12/10/2013 PCP: Ninfa Linden, FNP    Code Status: Full code Family Communication: Family not available. Disposition Plan: To be determined.   Consultants:  None  Procedures: 1.) 12/11/13.  2-D echocardiogram:Study Conclusions  - Procedure narrative: Transthoracic echocardiography. Image quality was suboptimal. The study was technically difficult, as a result of poor sound wave transmission and body habitus. - Left ventricle: The cavity size was normal. Wall thickness was increased in a pattern of moderate LVH. Systolic function was normal. The estimated ejection fraction was in the range of 60% to 65%. Doppler parameters are consistent with abnormal left ventricular relaxation (grade 1 diastolic dysfunction). Doppler parameters are consistent with both elevated ventricular end-diastolic filling pressure and elevated left atrial filling pressure. - Aortic valve: Mildly calcified annulus. Mildly thickened, mildly calcified leaflets. There wasborderline stenosis. - Mitral valve: Mild to moderate mitral stenosis. Calcified annulus. Mildly thickened leaflets . Trivial regurgitation. Mean gradient: 8mm Hg (D). Valve area by pressure half-time: 1.73cm^2. - Left atrium: The atrium was moderately to severely dilated. - Right atrium: The atrium was mildly dilated. - Pulmonary arteries: Inadequate spectral Doppler profile to accurately assess pulmonary pressures.  2.) 12/10/13 ultrasound-guided thoracentesis, yielding 10 cc of yellow fluid. 3.) BiPAP 12/10/13.  Antibiotics:  Azithromycin 12/12/13>>  Rocephin 12/12/13>>  HPI/Subjective: The patient is sitting up in bed. She complains of right toe "gout" pain.  Objective: Filed Vitals:   12/14/13 0905  BP: 91/65  Pulse:   Temp:   Resp: 20   Blood pressure 91/65. Heart rate 93. Oxygen saturation 93%. Respiratory rate 18.   Intake/Output  Summary (Last 24 hours) at 12/14/13 0948 Last data filed at 12/14/13 0500  Gross per 24 hour  Intake   1300 ml  Output   1200 ml  Net    100 ml   Filed Weights   12/12/13 0500 12/13/13 0500 12/14/13 0500  Weight: 113.9 kg (251 lb 1.7 oz) 114.6 kg (252 lb 10.4 oz) 113.3 kg (249 lb 12.5 oz)    Exam:  General:  Pleasant obese 78 roll Caucasian woman sitting up in bed, in no acute distress. Lungs: Clear in the upper lobes and decreased breath sounds in the bases. No active audible wheezes. Breathing is nonlabored. Heart: Irregular, irregular. Abdomen: Positive bowel sounds, soft, mildly obese, nontender, nondistended. Extremities/musculoskeletal:  Right great toe erythema and tenderTrace of bilateral lower extremity edema. Dressing/wraps in place bilaterally. (Not taken off). Neurologic: She is alert and oriented to herself and hospital. Her speech is clear. Cranial nerves II through XII are grossly intact.  Data Reviewed: Basic Metabolic Panel:  Recent Labs Lab 12/10/13 1130 12/11/13 0654 12/12/13 0459 12/13/13 0533 12/14/13 0500  NA 144 146 148* 145 142  K 4.0 4.0 4.0 4.2 4.8  CL 101 102 102 101 99  CO2 32 34* 39* 34* 35*  GLUCOSE 132* 141* 113* 111* 132*  BUN 23 35* 44* 38* 38*  CREATININE 1.11* 1.37* 1.40* 1.18* 1.27*  CALCIUM 9.1 9.0 8.8 9.0 9.1  MG 1.9  --   --   --   --    Liver Function Tests:  Recent Labs Lab 12/10/13 1130  AST 23  ALT 21  ALKPHOS 101  BILITOT 0.6  PROT 6.6  ALBUMIN 3.1*   No results found for this basename: LIPASE, AMYLASE,  in the last 168 hours No results found for this basename: AMMONIA,  in the last 168 hours CBC:  Recent  Labs Lab 12/10/13 1052 12/13/13 0904 12/14/13 0500  WBC 6.4 6.2 5.8  NEUTROABS 4.3  --   --   HGB 14.5 14.3 13.7  HCT 46.6* 46.3* 43.9  MCV 104.3* 105.2* 104.0*  PLT 185 159 144*   Cardiac Enzymes:  Recent Labs Lab 12/10/13 1213 12/10/13 1834 12/11/13 0045 12/11/13 0654  TROPONINI <0.30 <0.30  0.32* <0.30   BNP (last 3 results)  Recent Labs  12/10/13 1130  PROBNP 5669.0*   CBG: No results found for this basename: GLUCAP,  in the last 168 hours  Recent Results (from the past 240 hour(s))  URINE CULTURE     Status: None   Collection Time    12/10/13 10:58 AM      Result Value Range Status   Specimen Description URINE, CLEAN CATCH   Final   Special Requests NONE   Final   Culture  Setup Time     Final   Value: 12/10/2013 21:46     Performed at [facility] Count     Final   Value: >=100,000 COLONIES/ML     Performed at [facility]   Culture     Final   Value: ESCHERICHIA COLI     Performed at [facility]   Report Status 12/13/2013 FINAL   Final   Organism ID, Bacteria ESCHERICHIA COLI   Final  MRSA PCR SCREENING     Status: None   Collection Time    12/10/13  5:55 PM      Result Value Range Status   MRSA by PCR NEGATIVE  NEGATIVE Final   Comment:            The GeneXpert MRSA Assay (FDA     approved for NASAL specimens     only), is one component of a     comprehensive MRSA colonization     surveillance program. It is not     intended to diagnose MRSA     infection nor to guide or     monitor treatment for     MRSA infections.  BODY FLUID CULTURE     Status: None   Collection Time    12/11/13  1:52 PM      Result Value Range Status   Specimen Description FLUID RIGHT PLEURAL   Final   Special Requests NONE   Final   Gram Stain     Final   Value: CYTOSPIN ABUNDANT WBC PRESENT, PREDOMINANTLY MONONUCLEAR     NO ORGANISMS SEEN     Performed at [facility]     Performed at [facility]   Culture     Final   Value: NO GROWTH 1 DAY     Performed at [facility]   Report Status PENDING   Incomplete     Studies: Ct Angio Chest Pe W/cm &/or Wo Cm  12/12/2013   CLINICAL DATA:  Hypoxia, acute respiratory failure, history of COPD  EXAM: CT ANGIOGRAPHY CHEST WITH CONTRAST  TECHNIQUE: Multidetector CT  imaging of the chest was performed using the standard protocol during bolus administration of intravenous contrast. Multiplanar CT image reconstructions including MIPs were obtained to evaluate the vascular anatomy.  CONTRAST:  80mL OMNIPAQUE IOHEXOL 350 MG/ML SOLN  COMPARISON:  DG CHEST PORT 1VSAME DAY dated 12/11/2013  FINDINGS: Contrast within the pulmonary arterial tree is normal in appearance. There are no filling defects to suggest acute pulmonary embolism. There is a moderate-sized right pleural effusion  and smaller left pleural effusion. There is dense infiltrate within both lower lobes posteriorly. The upper lobes are spared. The cardiac chambers are top-normal in size. There is no pericardial effusion. The caliber of the thoracic aorta is normal. No bulky mediastinal or hilar lymph nodes are evident.  The observed portions of the thoracic spine exhibit no acute abnormalities. There is degenerative disc change inferiorly. The sternum appears intact. Within the upper abdomen the observed portions of the liver and spleen appear normal.  Review of the MIP images confirms the above findings.  IMPRESSION: 1. There is no evidence of an acute pulmonary embolism or acute thoracic aortic pathology. 2. Bibasilar pneumonia is present with a moderate-sized right pleural effusion and smaller left pleural effusion. 3. There is no evidence of CHF. No bulky mediastinal or hilar lymph nodes are evident.   Electronically Signed   By: David  Swazil[place]   On: 12/12/2013 11:36    Scheduled Meds: . allopurinol  100 mg Oral Daily  . antiseptic oral rinse  15 mL Mouth Rinse q12n4p  . atorvastatin  10 mg Oral q1800  . azithromycin  500 mg Intravenous Q24H  . cefTRIAXone (ROCEPHIN)  IV  1 g Intravenous Q24H  . furosemide  40 mg Oral BID  . gabapentin  300 mg Oral QHS  . heparin  5,000 Units Subcutaneous Q8H  . levalbuterol  0.63 mg Nebulization TID  . methylPREDNISolone (SOLU-MEDROL) injection  40 mg Intravenous Once  .  metoprolol tartrate  25 mg Oral BID  . mometasone-formoterol  2 puff Inhalation BID  . NIFEdipine  30 mg Oral QHS  . sodium chloride  3 mL Intravenous Q12H  . Warfarin - Pharmacist Dosing Inpatient   Does not apply Q24H   Continuous Infusions:    Assessment: Principal Problem:   Acute respiratory failure with hypoxia Active Problems:   Pleural effusion, bilateral   CAP (community acquired pneumonia)   Acute on chronic diastolic heart failure   Essential hypertension   COPD (chronic obstructive pulmonary disease)   Atrial fibrillation   UTI (urinary tract infection)   Lower extremity venous stasis   Acute renal insufficiency   1. Acute respiratory failure with hypoxia; secondary to a combination of bilateral pleural effusions, diastolic heart failure, and by basilar pneumonia. She is still oxygen dependent and may require supplemental oxygen at the time of discharge. CT angiogram essentially negative for PE.  Bilateral pleural effusions. I believe the effusions are secondary to both pneumonia and and does parapneumonic and decompensated diastolic heart failure. Thoracentesis only yielded 10 cc of yellow fluid; per radiology, anatomical concern limited further drainage. She is diuresed over 4.5 L to date. Lasix has been transitioned to 40 mg orally daily.  Acute on chronic diastolic heart failure. She is noted to have grade 1 diastolic dysfunction and ejection fraction of 60-65%. Also, her left atrium is moderately to severely dilated. As above, she is diuresed over 4.5 L to date. She is approaching compensation clinically. Lasix has been changed to 40 mg by mouth daily.  Newly diagnosed atrial fibrillation. Given the patient's heart failure, hypertension, and age, will favor anticoagulation. This was discussed in a curbside consultation with Dr. Purvis SheffieldKoneswaran who agrees. Coumadin was started 12/13/2013. This was discussed with the patient.  Hypertension. Her blood pressure is labile. When  her antihypertensive medications were titrated up, she becomes relatively ready cardiac and hypotensive. When they're titrated down to low, she becomes hypertensive. We will continue to adjust her medications accordingly. Metoprolol decreased  again and losartan will be held. Continue Procardia for now.   Bibasilar pneumonia. We'll continue Rocephin and azithromycin. Will add incentive spirometry to help with aeration.  COPD. Currently stable on Xopenex and Symbicort.  Urinary tract infection. The patient is asymptomatic. Urine culture grew out Escherichia coli. This is covered with Rocephin.  Acute renal insufficiency; likely secondary to diuresis. Creatinine is slightly up today. We'll follow.  Bilateral lower extremity edema with venous stasis. She is being treated with compression dressings. This is chronic. It appears that her edema has subsided.  Macrocytosis. We'll order TSH/free T4 is within normal limits. Her vitamin B12 level is within normal limits.  Great toe gout. We'll give her one dose of IV Solu-Medrol and continue a mild prednisone taper.  Mild thrombocytopenia. This will be followed.      Plan: 1. change Lasix to 20 mg twice a day for total of 40 mg daily. 2. Solu-Medrol times one and then a prednisone taper. 3. Adjust anti-hypertensive medications to avoid wide swings in her blood pressure. 3. Will transfer to the limits the bed. 4. PT evaluation pending.     Time spent: 40 minutes.    [facility]  [facility] Pager [phone]. If 7PM-7AM, please contact night-coverage at [email or web address], password [facility] 12/14/2013, 9:48 AM  LOS: 4 days

## 2013-12-14 NOTE — Progress Notes (Signed)
ANTICOAGULATION CONSULT NOTE - follow up  Pharmacy Consult for Coumadin Indication: atrial fibrillation  No Known Allergies  Patient Measurements: Height: 5\' 8"  (172.7 cm) Weight: 249 lb 12.5 oz (113.3 kg) IBW/kg (Calculated) : 63.9  Vital Signs: Temp: 97.4 F (36.3 C) (02/05 0730) Temp src: Axillary (02/05 0730) BP: 91/65 mmHg (02/05 0905) Pulse Rate: 34 (02/05 0900)  Labs:  Recent Labs  12/12/13 0459 12/13/13 0533 12/13/13 0904 12/14/13 0500  HGB  --   --  14.3 13.7  HCT  --   --  46.3* 43.9  PLT  --   --  159 144*  LABPROT  --   --   --  12.8  INR  --   --   --  0.98  CREATININE 1.40* 1.18*  --  1.27*   Estimated Creatinine Clearance: 49.8 ml/min (by C-G formula based on Cr of 1.27).  Medical History: Past Medical History  Diagnosis Date  . COPD (chronic obstructive pulmonary disease)   . Hypertension   . Hyperlipidemia   . Gout   . Poor historian   . Recurrent right pleural effusion 12/10/2013  . CAP (community acquired pneumonia) 12/13/2013  . Acute on chronic diastolic heart failure 12/13/2013  . Lower extremity venous stasis 12/13/2013   Medications:  Scheduled:  . allopurinol  100 mg Oral Daily  . antiseptic oral rinse  15 mL Mouth Rinse q12n4p  . atorvastatin  10 mg Oral q1800  . azithromycin  500 mg Intravenous Q24H  . cefTRIAXone (ROCEPHIN)  IV  1 g Intravenous Q24H  . furosemide  20 mg Oral BID  . gabapentin  300 mg Oral QHS  . heparin  5,000 Units Subcutaneous Q8H  . levalbuterol  0.63 mg Nebulization TID  . methylPREDNISolone (SOLU-MEDROL) injection  40 mg Intravenous Once  . metoprolol tartrate  25 mg Oral BID  . mometasone-formoterol  2 puff Inhalation BID  . NIFEdipine  30 mg Oral QHS  . [START ON 12/15/2013] predniSONE  20 mg Oral Q breakfast  . sodium chloride  3 mL Intravenous Q12H  . Warfarin - Pharmacist Dosing Inpatient   Does not apply Q24H   Assessment: 79yo female with new onset atrial fibrillation.  Initiating Coumadin  anticoagulation.  INR at baseline.  Pt is on Zithromax which can interact with warfarin and increase INR.  Goal of Therapy:  INR 2-3 Monitor platelets by anticoagulation protocol: Yes   Plan:  Coumadin 5mg  po today x 1 INR daily Continue SQ Heparin until INR at goal Coumadin education  Valrie HartHall, Rhyanna Sorce A 12/14/2013,10:45 AM

## 2013-12-15 ENCOUNTER — Inpatient Hospital Stay (HOSPITAL_COMMUNITY): Payer: Medicare FFS

## 2013-12-15 LAB — CBC
HCT: 44.7 % (ref 36.0–46.0)
HEMOGLOBIN: 14.3 g/dL (ref 12.0–15.0)
MCH: 32.9 pg (ref 26.0–34.0)
MCHC: 32 g/dL (ref 30.0–36.0)
MCV: 103 fL — ABNORMAL HIGH (ref 78.0–100.0)
Platelets: 157 10*3/uL (ref 150–400)
RBC: 4.34 MIL/uL (ref 3.87–5.11)
RDW: 15.9 % — AB (ref 11.5–15.5)
WBC: 6.8 10*3/uL (ref 4.0–10.5)

## 2013-12-15 LAB — BASIC METABOLIC PANEL
BUN: 37 mg/dL — ABNORMAL HIGH (ref 6–23)
CO2: 35 mEq/L — ABNORMAL HIGH (ref 19–32)
Calcium: 9.3 mg/dL (ref 8.4–10.5)
Chloride: 98 mEq/L (ref 96–112)
Creatinine, Ser: 1.06 mg/dL (ref 0.50–1.10)
GFR, EST AFRICAN AMERICAN: 58 mL/min — AB (ref >90)
GFR, EST NON AFRICAN AMERICAN: 50 mL/min — AB (ref >90)
GLUCOSE: 111 mg/dL — AB (ref 70–99)
POTASSIUM: 4.4 meq/L (ref 3.7–5.3)
SODIUM: 142 meq/L (ref 137–147)

## 2013-12-15 LAB — BODY FLUID CULTURE: Culture: NO GROWTH

## 2013-12-15 LAB — PROTIME-INR
INR: 0.99 (ref 0.00–1.49)
PROTHROMBIN TIME: 12.9 s (ref 11.6–15.2)

## 2013-12-15 MED ORDER — WARFARIN SODIUM 7.5 MG PO TABS
7.5000 mg | ORAL_TABLET | Freq: Once | ORAL | Status: AC
  Administered 2013-12-15: 7.5 mg via ORAL
  Filled 2013-12-15: qty 1

## 2013-12-15 MED ORDER — CEFUROXIME AXETIL 250 MG PO TABS
500.0000 mg | ORAL_TABLET | Freq: Two times a day (BID) | ORAL | Status: DC
  Administered 2013-12-15 – 2013-12-18 (×7): 500 mg via ORAL
  Filled 2013-12-15 (×7): qty 2

## 2013-12-15 NOTE — Progress Notes (Signed)
[facility] PROGRESS NOTE  Paula Fletcher:[id number] DOB: 1935-04-24 DOA: 12/10/2013 PCP: Ninfa Linden, FNP    Code Status: Full code Family Communication: Family not available. Disposition Plan: Discharge to home when clinically appropriate, possibly tomorrow with home health services.   Consultants:  None  Procedures: 1.) 12/11/13.  2-D echocardiogram:Study Conclusions  - Procedure narrative: Transthoracic echocardiography. Image quality was suboptimal. The study was technically difficult, as a result of poor sound wave transmission and body habitus. - Left ventricle: The cavity size was normal. Wall thickness was increased in a pattern of moderate LVH. Systolic function was normal. The estimated ejection fraction was in the range of 60% to 65%. Doppler parameters are consistent with abnormal left ventricular relaxation (grade 1 diastolic dysfunction). Doppler parameters are consistent with both elevated ventricular end-diastolic filling pressure and elevated left atrial filling pressure. - Aortic valve: Mildly calcified annulus. Mildly thickened, mildly calcified leaflets. There wasborderline stenosis. - Mitral valve: Mild to moderate mitral stenosis. Calcified annulus. Mildly thickened leaflets . Trivial regurgitation. Mean gradient: 8mm Hg (D). Valve area by pressure half-time: 1.73cm^2. - Left atrium: The atrium was moderately to severely dilated. - Right atrium: The atrium was mildly dilated. - Pulmonary arteries: Inadequate spectral Doppler profile to accurately assess pulmonary pressures.  2.) 12/10/13 ultrasound-guided thoracentesis, yielding 10 cc of yellow fluid. 3.) BiPAP 12/10/13.  Antibiotics:  Azithromycin 12/12/13>>  Rocephin 12/12/13>>  HPI/Subjective: The patient is sitting up in bed. She has no complaints of toe pain from gout. She has no complaints of shortness of breath or chest pain at rest.  Objective: Filed Vitals:   12/15/13 1535   BP: 128/82  Pulse: 85  Temp: 97.9 F (36.6 C)  Resp: 20   Oxygen saturation in the 90s on 4 L of oxygen.   Intake/Output Summary (Last 24 hours) at 12/15/13 1553 Last data filed at 12/15/13 1500  Gross per 24 hour  Intake   1030 ml  Output   2100 ml  Net  -1070 ml   Filed Weights   12/13/13 0500 12/14/13 0500 12/15/13 0524  Weight: 114.6 kg (252 lb 10.4 oz) 113.3 kg (249 lb 12.5 oz) 114.8 kg (253 lb 1.4 oz)    Exam:  General:  Pleasant obese 78 roll Caucasian woman sitting up in bed, in no acute distress. Lungs: Clear in the upper lobes and decreased breath sounds in the bases. No active audible wheezes. Breathing is nonlabored. Heart: Irregular, irregular. Abdomen: Positive bowel sounds, soft, mildly obese, nontender, nondistended. Extremities/musculoskeletal:  Right great toe mild erythema and non-- tender. Trace of bilateral lower extremity edema. Dressing/wraps in place bilaterally. (Not taken off). Neurologic: She is alert and oriented to herself and hospital. Her speech is clear. Cranial nerves II through XII are grossly intact.  Data Reviewed: Basic Metabolic Panel:  Recent Labs Lab 12/10/13 1130 12/11/13 0654 12/12/13 0459 12/13/13 0533 12/14/13 0500 12/15/13 0518  NA 144 146 148* 145 142 142  K 4.0 4.0 4.0 4.2 4.8 4.4  CL 101 102 102 101 99 98  CO2 32 34* 39* 34* 35* 35*  GLUCOSE 132* 141* 113* 111* 132* 111*  BUN 23 35* 44* 38* 38* 37*  CREATININE 1.11* 1.37* 1.40* 1.18* 1.27* 1.06  CALCIUM 9.1 9.0 8.8 9.0 9.1 9.3  MG 1.9  --   --   --   --   --    Liver Function Tests:  Recent Labs Lab 12/10/13 1130  AST 23  ALT 21  ALKPHOS 101  BILITOT  0.6  PROT 6.6  ALBUMIN 3.1*   No results found for this basename: LIPASE, AMYLASE,  in the last 168 hours No results found for this basename: AMMONIA,  in the last 168 hours CBC:  Recent Labs Lab 12/10/13 1052 12/13/13 0904 12/14/13 0500 12/15/13 0518  WBC 6.4 6.2 5.8 6.8  NEUTROABS 4.3  --   --    --   HGB 14.5 14.3 13.7 14.3  HCT 46.6* 46.3* 43.9 44.7  MCV 104.3* 105.2* 104.0* 103.0*  PLT 185 159 144* 157   Cardiac Enzymes:  Recent Labs Lab 12/10/13 1213 12/10/13 1834 12/11/13 0045 12/11/13 0654  TROPONINI <0.30 <0.30 0.32* <0.30   BNP (last 3 results)  Recent Labs  12/10/13 1130  PROBNP 5669.0*   CBG: No results found for this basename: GLUCAP,  in the last 168 hours  Recent Results (from the past 240 hour(s))  URINE CULTURE     Status: None   Collection Time    12/10/13 10:58 AM      Result Value Range Status   Specimen Description URINE, CLEAN CATCH   Final   Special Requests NONE   Final   Culture  Setup Time     Final   Value: 12/10/2013 21:46     Performed at [facility] Count     Final   Value: >=100,000 COLONIES/ML     Performed at [facility]   Culture     Final   Value: ESCHERICHIA COLI     Performed at [facility]   Report Status 12/13/2013 FINAL   Final   Organism ID, Bacteria ESCHERICHIA COLI   Final  MRSA PCR SCREENING     Status: None   Collection Time    12/10/13  5:55 PM      Result Value Range Status   MRSA by PCR NEGATIVE  NEGATIVE Final   Comment:            The GeneXpert MRSA Assay (FDA     approved for NASAL specimens     only), is one component of a     comprehensive MRSA colonization     surveillance program. It is not     intended to diagnose MRSA     infection nor to guide or     monitor treatment for     MRSA infections.  BODY FLUID CULTURE     Status: None   Collection Time    12/11/13  1:52 PM      Result Value Range Status   Specimen Description FLUID RIGHT PLEURAL   Final   Special Requests NONE   Final   Gram Stain     Final   Value: CYTOSPIN ABUNDANT WBC PRESENT, PREDOMINANTLY MONONUCLEAR     NO ORGANISMS SEEN     Performed at [facility]     Performed at [facility]   Culture     Final   Value: NO GROWTH 3 DAYS     Performed at [facility]    Report Status 12/15/2013 FINAL   Final     Studies: Dg Chest Port 1 View  12/15/2013   CLINICAL DATA:  Pneumonia and effusion.  EXAM: PORTABLE CHEST - 1 VIEW  COMPARISON:  12/11/2013 and 12/12/2013  FINDINGS: Again noted are low lung volumes with bibasilar densities. Based on the recent CT, these basilar densities are related pleural fluid. Left pleural effusion may have slightly enlarged but the  right pleural effusion may be smaller. No evidence for pulmonary edema or upper lung airspace disease. The heart is obscured by the basilar lung densities. Again noted is remodeling and sclerosis in the proximal left humerus, probably posttraumatic in etiology.  IMPRESSION: Low lung volumes with persistent bibasilar densities. Findings are consistent with small pleural effusions and minimal change from the previous examinations.   Electronically Signed   By: Richarda Overlie M.D.   On: 12/15/2013 08:29    Scheduled Meds: . allopurinol  100 mg Oral Daily  . antiseptic oral rinse  15 mL Mouth Rinse q12n4p  . atorvastatin  10 mg Oral q1800  . azithromycin  500 mg Intravenous Q24H  . cefTRIAXone (ROCEPHIN)  IV  1 g Intravenous Q24H  . furosemide  20 mg Oral BID  . gabapentin  300 mg Oral QHS  . heparin  5,000 Units Subcutaneous Q8H  . levalbuterol  0.63 mg Nebulization TID  . metoprolol tartrate  25 mg Oral BID  . mometasone-formoterol  2 puff Inhalation BID  . NIFEdipine  30 mg Oral QHS  . predniSONE  20 mg Oral Q breakfast  . sodium chloride  3 mL Intravenous Q12H  . Warfarin - Pharmacist Dosing Inpatient   Does not apply Q24H   Continuous Infusions:    Assessment: Principal Problem:   Acute respiratory failure with hypoxia Active Problems:   Pleural effusion, bilateral   CAP (community acquired pneumonia)   Acute on chronic diastolic heart failure   Essential hypertension   COPD (chronic obstructive pulmonary disease)   Atrial fibrillation   UTI (urinary tract infection)   Lower extremity  venous stasis   Acute renal insufficiency   1. Acute respiratory failure with hypoxia; secondary to a combination of bilateral pleural effusions, diastolic heart failure, and by basilar pneumonia. She is still oxygen dependent and will require supplemental oxygen at the time of discharge. CT angiogram essentially negative for PE.  Bilateral pleural effusions. I believe the effusions are secondary to both pneumonia and and does parapneumonic and decompensated diastolic heart failure. Thoracentesis only yielded 10 cc of yellow fluid; per radiology, anatomical concern limited further drainage. She is diuresed over 4.5 L to date. Lasix has been transitioned to 40 mg orally daily. We'll discontinue her Foley catheter.  Acute on chronic diastolic heart failure. She is noted to have grade 1 diastolic dysfunction and ejection fraction of 60-65%. Also, her left atrium is moderately to severely dilated. As above, she is diuresed over 4.5 L to date. She is approaching compensation clinically. Lasix has been changed to 40 mg by mouth daily.  Newly diagnosed atrial fibrillation. Given the patient's heart failure, hypertension, and age, will favor anticoagulation. This was discussed in a curbside consultation with Dr. Purvis Sheffield who agrees. Coumadin was started 12/13/2013. This was discussed with the patient.  Hypertension. Her blood pressure is labile. When her antihypertensive medications were titrated up, she becomes relatively ready cardiac and hypotensive. When they're titrated down to low, she becomes hypertensive. We will continue to adjust her medications accordingly. Metoprolol decreased again and losartan will be held. Continue Procardia for now.   Bibasilar pneumonia. We'll continue Rocephin and azithromycin. Will add incentive spirometry to help with aeration.  COPD. Currently stable on Xopenex and Symbicort.  Urinary tract infection. The patient is asymptomatic. Urine culture grew out Escherichia  coli. This is covered with Rocephin.  Acute renal insufficiency; likely secondary to diuresis. Creatinine is slightly up today. We'll follow.  Bilateral lower extremity edema with  venous stasis. She is being treated with compression dressings. This is chronic. It appears that her edema has subsided.  Macrocytosis.  TSH/free T4 was within normal limits. Her vitamin B12 level was within normal limits.  Great toe gout. S/P  IV Solu-Medrol x 1. Will continue a mild prednisone taper.  Mild thrombocytopenia. This will be followed.      Plan: 1. Will change antibiotics to by mouth and discontinue telemetry. 2. We'll check her oxygen saturation on room air today and tomorrow. 3. Anticipate discharge tomorrow.     Time spent: 20 minutes.    Adventhealth Daytona BeachFISHER,Miliano Cotten  [facility] Pager [phone]. If 7PM-7AM, please contact night-coverage at [email or web address], password [email or web address] [email or web address] 12/15/2013, 3:53 PM  LOS: 5 days

## 2013-12-15 NOTE — Progress Notes (Signed)
Sitting o2 sats  Resting on oxygen  3 liters: 80 4 liters: 84  5 liters: 88 6 liters: 90

## 2013-12-15 NOTE — Progress Notes (Signed)
Profore dressings were inspected and are intact and comfortable.  Pt was encouraged to be OOB and walking---I have asked nursing service to assist with this.

## 2013-12-15 NOTE — Progress Notes (Signed)
ANTICOAGULATION CONSULT NOTE  Pharmacy Consult for Coumadin Indication: atrial fibrillation  No Known Allergies  Patient Measurements: Height: 5\' 8"  (172.7 cm) Weight: 253 lb 1.4 oz (114.8 kg) IBW/kg (Calculated) : 63.9  Vital Signs: Temp: 97.6 F (36.4 C) (02/06 0524) Temp src: Oral (02/06 0524) BP: 129/68 mmHg (02/06 0524) Pulse Rate: 75 (02/06 0524)  Labs:  Recent Labs  12/13/13 0533  12/13/13 0904 12/14/13 0500 12/15/13 0518  HGB  --   < > 14.3 13.7 14.3  HCT  --   --  46.3* 43.9 44.7  PLT  --   --  159 144* 157  LABPROT  --   --   --  12.8 12.9  INR  --   --   --  0.98 0.99  CREATININE 1.18*  --   --  1.27* 1.06  < > = values in this interval not displayed. Estimated Creatinine Clearance: 60.1 ml/min (by C-G formula based on Cr of 1.06).  Medical History: Past Medical History  Diagnosis Date  . COPD (chronic obstructive pulmonary disease)   . Hypertension   . Hyperlipidemia   . Gout   . Poor historian   . Recurrent right pleural effusion 12/10/2013  . CAP (community acquired pneumonia) 12/13/2013  . Acute on chronic diastolic heart failure 12/13/2013  . Lower extremity venous stasis 12/13/2013   Medications:  Scheduled:  . allopurinol  100 mg Oral Daily  . antiseptic oral rinse  15 mL Mouth Rinse q12n4p  . atorvastatin  10 mg Oral q1800  . azithromycin  500 mg Intravenous Q24H  . cefTRIAXone (ROCEPHIN)  IV  1 g Intravenous Q24H  . furosemide  20 mg Oral BID  . gabapentin  300 mg Oral QHS  . heparin  5,000 Units Subcutaneous Q8H  . levalbuterol  0.63 mg Nebulization TID  . metoprolol tartrate  25 mg Oral BID  . mometasone-formoterol  2 puff Inhalation BID  . NIFEdipine  30 mg Oral QHS  . predniSONE  20 mg Oral Q breakfast  . sodium chloride  3 mL Intravenous Q12H  . Warfarin - Pharmacist Dosing Inpatient   Does not apply Q24H   Assessment: 79yo female with new onset atrial fibrillation.  Initiating Coumadin anticoagulation.  INR remains at baseline.  Pt  is on Zithromax which can interact with warfarin and increase INR. No bleeding noted.   Goal of Therapy:  INR 2-3 Monitor platelets by anticoagulation protocol: Yes   Plan:  Increase Coumadin 7.5mg  po today x 1 INR daily Continue SQ Heparin until INR >2  Elson ClanLilliston, Tayte Childers Michelle 12/15/2013,8:34 AM

## 2013-12-15 NOTE — Progress Notes (Signed)
Nutrition Brief Note  RD pulled to chart due to LOS  Wt Readings from Last 15 Encounters:  12/15/13 253 lb 1.4 oz (114.8 kg)    Body mass index is 38.49 kg/(m^2). Patient meets criteria for obesity, class II based on current BMI.   Current diet order is Heart Healthy, patient is consuming approximately 100% of meals at this time. Labs and medications reviewed.   No nutrition interventions warranted at this time. If nutrition issues arise, please consult RD.   Paula Fletcher, RD, LDN Pager: [phone]

## 2013-12-15 NOTE — Progress Notes (Signed)
[facility] PROGRESS NOTE  Paula Fletcher ZOX:[id number] DOB: 1935/09/18 DOA: 12/10/2013 PCP: Ninfa Linden, FNP    Code Status: Full code Family Communication: Family not available. Disposition Plan: Discharge to home when clinically appropriate, possibly tomorrow with home health services.   Consultants:  None  Procedures: 1.) 12/11/13.  2-D echocardiogram:Study Conclusions  - Procedure narrative: Transthoracic echocardiography. Image quality was suboptimal. The study was technically difficult, as a result of poor sound wave transmission and body habitus. - Left ventricle: The cavity size was normal. Wall thickness was increased in a pattern of moderate LVH. Systolic function was normal. The estimated ejection fraction was in the range of 60% to 65%. Doppler parameters are consistent with abnormal left ventricular relaxation (grade 1 diastolic dysfunction). Doppler parameters are consistent with both elevated ventricular end-diastolic filling pressure and elevated left atrial filling pressure. - Aortic valve: Mildly calcified annulus. Mildly thickened, mildly calcified leaflets. There wasborderline stenosis. - Mitral valve: Mild to moderate mitral stenosis. Calcified annulus. Mildly thickened leaflets . Trivial regurgitation. Mean gradient: 8mm Hg (D). Valve area by pressure half-time: 1.73cm^2. - Left atrium: The atrium was moderately to severely dilated. - Right atrium: The atrium was mildly dilated. - Pulmonary arteries: Inadequate spectral Doppler profile to accurately assess pulmonary pressures.  2.) 12/10/13 ultrasound-guided thoracentesis, yielding 10 cc of yellow fluid. 3.) BiPAP 12/10/13.  Antibiotics:  Ceftin 12/15/13>>  Azithromycin 12/12/13>>12/15/13  Rocephin 12/12/13>>12/15/13  HPI/Subjective: The patient is sitting up in bed. She has no complaints of toe pain from gout. She has no complaints of shortness of breath or chest pain at  rest.  Objective: Filed Vitals:   12/15/13 1535  BP: 128/82  Pulse: 85  Temp: 97.9 F (36.6 C)  Resp: 20   Oxygen saturation in the 90s on 4 L of oxygen.   Intake/Output Summary (Last 24 hours) at 12/15/13 1604 Last data filed at 12/15/13 1500  Gross per 24 hour  Intake   1030 ml  Output   2100 ml  Net  -1070 ml   Filed Weights   12/13/13 0500 12/14/13 0500 12/15/13 0524  Weight: 114.6 kg (252 lb 10.4 oz) 113.3 kg (249 lb 12.5 oz) 114.8 kg (253 lb 1.4 oz)    Exam:  General:  Pleasant obese 78 roll Caucasian woman sitting up in bed, in no acute distress. Lungs: Clear in the upper lobes and decreased breath sounds in the bases. No active audible wheezes. Breathing is nonlabored. Heart: Irregular, irregular. Abdomen: Positive bowel sounds, soft, mildly obese, nontender, nondistended. Extremities/musculoskeletal:  Right great toe mild erythema and non-- tender. Trace of bilateral lower extremity edema. Dressing/wraps in place bilaterally. (Not taken off). Neurologic: She is alert and oriented to herself and hospital. Her speech is clear. Cranial nerves II through XII are grossly intact.  Data Reviewed: Basic Metabolic Panel:  Recent Labs Lab 12/10/13 1130 12/11/13 0654 12/12/13 0459 12/13/13 0533 12/14/13 0500 12/15/13 0518  NA 144 146 148* 145 142 142  K 4.0 4.0 4.0 4.2 4.8 4.4  CL 101 102 102 101 99 98  CO2 32 34* 39* 34* 35* 35*  GLUCOSE 132* 141* 113* 111* 132* 111*  BUN 23 35* 44* 38* 38* 37*  CREATININE 1.11* 1.37* 1.40* 1.18* 1.27* 1.06  CALCIUM 9.1 9.0 8.8 9.0 9.1 9.3  MG 1.9  --   --   --   --   --    Liver Function Tests:  Recent Labs Lab 12/10/13 1130  AST 23  ALT 21  ALKPHOS  101  BILITOT 0.6  PROT 6.6  ALBUMIN 3.1*   No results found for this basename: LIPASE, AMYLASE,  in the last 168 hours No results found for this basename: AMMONIA,  in the last 168 hours CBC:  Recent Labs Lab 12/10/13 1052 12/13/13 0904 12/14/13 0500  12/15/13 0518  WBC 6.4 6.2 5.8 6.8  NEUTROABS 4.3  --   --   --   HGB 14.5 14.3 13.7 14.3  HCT 46.6* 46.3* 43.9 44.7  MCV 104.3* 105.2* 104.0* 103.0*  PLT 185 159 144* 157   Cardiac Enzymes:  Recent Labs Lab 12/10/13 1213 12/10/13 1834 12/11/13 0045 12/11/13 0654  TROPONINI <0.30 <0.30 0.32* <0.30   BNP (last 3 results)  Recent Labs  12/10/13 1130  PROBNP 5669.0*   CBG: No results found for this basename: GLUCAP,  in the last 168 hours  Recent Results (from the past 240 hour(s))  URINE CULTURE     Status: None   Collection Time    12/10/13 10:58 AM      Result Value Range Status   Specimen Description URINE, CLEAN CATCH   Final   Special Requests NONE   Final   Culture  Setup Time     Final   Value: 12/10/2013 21:46     Performed at [facility] Count     Final   Value: >=100,000 COLONIES/ML     Performed at [facility]   Culture     Final   Value: ESCHERICHIA COLI     Performed at [facility]   Report Status 12/13/2013 FINAL   Final   Organism ID, Bacteria ESCHERICHIA COLI   Final  MRSA PCR SCREENING     Status: None   Collection Time    12/10/13  5:55 PM      Result Value Range Status   MRSA by PCR NEGATIVE  NEGATIVE Final   Comment:            The GeneXpert MRSA Assay (FDA     approved for NASAL specimens     only), is one component of a     comprehensive MRSA colonization     surveillance program. It is not     intended to diagnose MRSA     infection nor to guide or     monitor treatment for     MRSA infections.  BODY FLUID CULTURE     Status: None   Collection Time    12/11/13  1:52 PM      Result Value Range Status   Specimen Description FLUID RIGHT PLEURAL   Final   Special Requests NONE   Final   Gram Stain     Final   Value: CYTOSPIN ABUNDANT WBC PRESENT, PREDOMINANTLY MONONUCLEAR     NO ORGANISMS SEEN     Performed at [facility]     Performed at [facility]   Culture     Final    Value: NO GROWTH 3 DAYS     Performed at [facility]   Report Status 12/15/2013 FINAL   Final     Studies: Dg Chest Port 1 View  12/15/2013   CLINICAL DATA:  Pneumonia and effusion.  EXAM: PORTABLE CHEST - 1 VIEW  COMPARISON:  12/11/2013 and 12/12/2013  FINDINGS: Again noted are low lung volumes with bibasilar densities. Based on the recent CT, these basilar densities are related pleural fluid. Left pleural effusion may have slightly  enlarged but the right pleural effusion may be smaller. No evidence for pulmonary edema or upper lung airspace disease. The heart is obscured by the basilar lung densities. Again noted is remodeling and sclerosis in the proximal left humerus, probably posttraumatic in etiology.  IMPRESSION: Low lung volumes with persistent bibasilar densities. Findings are consistent with small pleural effusions and minimal change from the previous examinations.   Electronically Signed   By: Richarda Overlie M.D.   On: 12/15/2013 08:29    Scheduled Meds: . allopurinol  100 mg Oral Daily  . antiseptic oral rinse  15 mL Mouth Rinse q12n4p  . atorvastatin  10 mg Oral q1800  . cefUROXime  500 mg Oral BID WC  . furosemide  20 mg Oral BID  . gabapentin  300 mg Oral QHS  . heparin  5,000 Units Subcutaneous Q8H  . levalbuterol  0.63 mg Nebulization TID  . metoprolol tartrate  25 mg Oral BID  . mometasone-formoterol  2 puff Inhalation BID  . NIFEdipine  30 mg Oral QHS  . predniSONE  20 mg Oral Q breakfast  . sodium chloride  3 mL Intravenous Q12H  . Warfarin - Pharmacist Dosing Inpatient   Does not apply Q24H   Continuous Infusions:    Assessment: Principal Problem:   Acute respiratory failure with hypoxia Active Problems:   Pleural effusion, bilateral   CAP (community acquired pneumonia)   Acute on chronic diastolic heart failure   Essential hypertension   COPD (chronic obstructive pulmonary disease)   Atrial fibrillation   UTI (urinary tract infection)   Lower  extremity venous stasis   Acute renal insufficiency   1. Acute respiratory failure with hypoxia; secondary to a combination of bilateral pleural effusions, diastolic heart failure, and by basilar pneumonia. She is still oxygen dependent and will require supplemental oxygen at the time of discharge. CT angiogram essentially negative for PE.  Bilateral pleural effusions. I believe the effusions are secondary to both pneumonia and and does parapneumonic and decompensated diastolic heart failure. Thoracentesis only yielded 10 cc of yellow fluid; per radiology, anatomical concern limited further drainage. She is diuresed over 4.5 L to date. Lasix has been transitioned to 40 mg orally daily. We'll discontinue her Foley catheter.  Acute on chronic diastolic heart failure. She is noted to have grade 1 diastolic dysfunction and ejection fraction of 60-65%. Also, her left atrium is moderately to severely dilated. As above, she is diuresed over 4.5 L to date. She is approaching compensation clinically. Lasix has been changed to 40 mg by mouth daily.  Newly diagnosed atrial fibrillation. Given the patient's heart failure, hypertension, and age, will favor anticoagulation. This was discussed in a curbside consultation with Dr. Purvis Sheffield who agrees. Coumadin was started 12/13/2013. This was discussed with the patient.  Hypertension. Her blood pressure is labile. When her antihypertensive medications were titrated up, she becomes relatively ready cardiac and hypotensive. When they're titrated down to low, she becomes hypertensive. We will continue to adjust her medications accordingly. Metoprolol decreased again and losartan will be held. Continue Procardia for now.   Bibasilar pneumonia. We'll continue Rocephin and azithromycin. Will add incentive spirometry to help with aeration.  COPD. Currently stable on Xopenex and Symbicort.  Urinary tract infection. The patient is asymptomatic. Urine culture grew out  Escherichia coli. This is covered with Rocephin.  Acute renal insufficiency; likely secondary to diuresis. Creatinine is slightly up today. We'll follow.  Bilateral lower extremity edema with venous stasis. She is being treated with  compression dressings. This is chronic. It appears that her edema has subsided.  Macrocytosis.  TSH/free T4 was within normal limits. Her vitamin B12 level was within normal limits.  Great toe gout. S/P  IV Solu-Medrol x 1. Will continue a mild prednisone taper.  Mild thrombocytopenia. This will be followed.      Plan: 1. Will change antibiotics to by mouth and discontinue telemetry. 2. We'll check her oxygen saturation on room air today and tomorrow. 3. Anticipate discharge tomorrow.     Time spent: 20 minutes.    [facility]inity Medical CenterFISHER,Desirae Mancusi  [facility] Pager [phone]. If 7PM-7AM, please contact night-coverage at [email or web address], password [email or web address] 12/15/2013, 4:04 PM  LOS: 5 days

## 2013-12-15 NOTE — Progress Notes (Signed)
[employer] called and O2 setup. WIll bring o2 tank to hospital for transport home tomorrow.

## 2013-12-16 ENCOUNTER — Inpatient Hospital Stay (HOSPITAL_COMMUNITY): Payer: Medicare FFS

## 2013-12-16 DIAGNOSIS — N390 Urinary tract infection, site not specified: Secondary | ICD-10-CM

## 2013-12-16 LAB — BLOOD GAS, ARTERIAL
ACID-BASE EXCESS: 9.1 mmol/L — AB (ref 0.0–2.0)
Acid-Base Excess: 9.5 mmol/L — ABNORMAL HIGH (ref 0.0–2.0)
BICARBONATE: 34.1 meq/L — AB (ref 20.0–24.0)
Bicarbonate: 33.1 mEq/L — ABNORMAL HIGH (ref 20.0–24.0)
DRAWN BY: 25788
Drawn by: 25788
FIO2: 50 %
FIO2: 60 %
O2 Saturation: 86.5 %
O2 Saturation: 94.7 %
PCO2 ART: 45.1 mmHg — AB (ref 35.0–45.0)
PH ART: 7.435 (ref 7.350–7.450)
PH ART: 7.478 — AB (ref 7.350–7.450)
PO2 ART: 53.1 mmHg — AB (ref 80.0–100.0)
PO2 ART: 71.7 mmHg — AB (ref 80.0–100.0)
Patient temperature: 37
Patient temperature: 37
TCO2: 29.9 mmol/L (ref 0–100)
TCO2: 28.1 mmol/L (ref 0–100)
pCO2 arterial: 51.8 mmHg — ABNORMAL HIGH (ref 35.0–45.0)

## 2013-12-16 LAB — PROTIME-INR
INR: 1.08 (ref 0.00–1.49)
Prothrombin Time: 13.8 seconds (ref 11.6–15.2)

## 2013-12-16 MED ORDER — WARFARIN SODIUM 10 MG PO TABS
10.0000 mg | ORAL_TABLET | Freq: Once | ORAL | Status: AC
  Administered 2013-12-16: 10 mg via ORAL
  Filled 2013-12-16 (×2): qty 1

## 2013-12-16 NOTE — Progress Notes (Signed)
PT sat on 60% partial rebreathing mask 94-95, suspect PT still to have fluid volume problem due to wt gain. On 2/1 PT weight 249 lbs 1.9 oz , BNP 5669 ----- 2/7 weight 257 lbs 1.6 oz  BNP Unknown at this time. Pt does have smoking history some COPD but does not appear to be total problem. This most likely chronic on low PaO2  Due to PT lack of symptoms. PT still needs fluid diurese ?

## 2013-12-16 NOTE — Progress Notes (Signed)
ANTICOAGULATION CONSULT NOTE  Pharmacy Consult for Coumadin Indication: atrial fibrillation  No Known Allergies  Patient Measurements: Height: 5\' 8"  (172.7 cm) Weight: 257 lb 1.6 oz (116.62 kg) IBW/kg (Calculated) : 63.9  Vital Signs: Temp: 98.2 F (36.8 C) (02/07 0444) Temp src: Oral (02/07 0444) BP: 116/64 mmHg (02/07 0444) Pulse Rate: 81 (02/07 0444)  Labs:  Recent Labs  12/13/13 0904 12/14/13 0500 12/15/13 0518 12/16/13 0500  HGB 14.3 13.7 14.3  --   HCT 46.3* 43.9 44.7  --   PLT 159 144* 157  --   LABPROT  --  12.8 12.9 13.8  INR  --  0.98 0.99 1.08  CREATININE  --  1.27* 1.06  --    Estimated Creatinine Clearance: 60.6 ml/min (by C-G formula based on Cr of 1.06).  Medical History: Past Medical History  Diagnosis Date  . COPD (chronic obstructive pulmonary disease)   . Hypertension   . Hyperlipidemia   . Gout   . Poor historian   . Recurrent right pleural effusion 12/10/2013  . CAP (community acquired pneumonia) 12/13/2013  . Acute on chronic diastolic heart failure 12/13/2013  . Lower extremity venous stasis 12/13/2013   Medications:  Scheduled:  . allopurinol  100 mg Oral Daily  . antiseptic oral rinse  15 mL Mouth Rinse q12n4p  . atorvastatin  10 mg Oral q1800  . cefUROXime  500 mg Oral BID WC  . furosemide  20 mg Oral BID  . gabapentin  300 mg Oral QHS  . heparin  5,000 Units Subcutaneous Q8H  . levalbuterol  0.63 mg Nebulization TID  . metoprolol tartrate  25 mg Oral BID  . mometasone-formoterol  2 puff Inhalation BID  . NIFEdipine  30 mg Oral QHS  . predniSONE  20 mg Oral Q breakfast  . sodium chloride  3 mL Intravenous Q12H  . Warfarin - Pharmacist Dosing Inpatient   Does not apply Q24H   Assessment: 79yo female with new onset atrial fibrillation.  Initiating Coumadin anticoagulation for CHADS2Vasc score of 5 (HF, HTN, female, age>75).   INR remains at baseline.  Slight increase in INR with dose increase.  No bleeding noted.   Goal of Therapy:   INR 2-3   Plan:  Increase Coumadin 10 mg po x1 now INR daily Continue SQ Heparin until INR >2 or discharge to home If discharge home, consider 7.5-10 mg daily and re-check INR next week Patient educated on Coumadin  Elson ClanLilliston, Kailynne Ferrington Michelle 12/16/2013,8:47 AM

## 2013-12-16 NOTE — Discharge Summary (Signed)
Physician Discharge Summary  Paula Fletcher RUE:[id number]:[id number] DOB: 12-05-1934 DOA: 12/10/2013  PCP: Ninfa LindenPATTERSON, KATHY, FNP  Admit date: 12/10/2013 Discharge date: 12/17/2013  Time spent: 30 minutes  Recommendations for Outpatient Follow-up:  1. The patient was discharged on 6 L of nasal cannula oxygen. Her oxygen saturations will need to be reassessed at each hospital followup appointment for adjustments as needed. 2. Recommend followup CT of the chest to reevaluate 7 mm right lower lobe pulmonary nodule. 3. Recommend scheduling of outpatient sleep study to evaluate for obstructive sleep apnea.  Discharge Diagnoses:  1. Acute respiratory failure with hypoxia, secondary to a combination of bilateral pleural effusions, diastolic heart failure, and bibasilar pneumonia. 2. Acute respiratory failure with hypoxia. Etiology multifactorial including a combination of bilateral pleural effusions, diastolic heart failure, and bibasilar pneumonia. 3. Bilateral pleural effusions, likely secondary to a combination of both pneumonia (parapneumonic) and mild decompensated diastolic heart failure. 4. Basilar pneumonia. 5. Acute on chronic diastolic heart failure. -4.5 L. Weight on admission to and 49 pounds. Weight at discharge 244 pounds (query accuracy of weights). 6. COPD  7. Newly diagnosed atrial fibrillation. 8. Urinary tract infection. 9. Hypertension. 10. Acute renal insufficiency, likely secondary to diuresis. Resolved. 11. Macrocytosis. TSH/free T4 and vitamin B12 levels within normal limits. 12. Great toe gout. 13. Morbid obesity. 14. Suspected obesity hypoventilation syndrome and/or obstructive sleep apnea. 15. 7 mm right lower lobe pulmonary nodule. Recommend followup CT in 6-12 months.   Discharge Condition: Improved  Diet recommendation: Heart healthy.  Filed Weights   12/15/13 0524 12/16/13 0444 12/18/13 0449  Weight: 114.8 kg (253 lb 1.4 oz) 116.62 kg (257 lb 1.6 oz) 110.995 kg (244 lb  11.2 oz)    History of present illness:   Paula Fletcher is a 79 y.o. female  With a past medical history of COPD, HTN,  and questionable heart failure that came in for shortness of breath. It started one week prior to admission and became progressively worse. She was started on Advair and albuterol without any relief. She had experienced some nonproductive coughing and generalized weakness. She had some subjective fever, but no chills. She also noticed some swelling in both of her legs. She was having difficulty laying flat. She denied chest pain, nausea, vomiting, or palpitations.  In the ED:  An EKG was checked and it revealed atrial fibrillation, CBC showed an MCV of 104 with a normal hemoglobin, BMP of greater than 5000, creatinine of 1.1 and normal cardiac enzymes  Hospital Course:   1. Acute respiratory failure with hypoxia; secondary to a combination of bilateral pleural effusions, diastolic heart failure, and basilar pneumonia.  The patient was started on IV Lasix. Her chronic cardiac medications were continued. Oxygen was titrated to proximal and saturations greater than 90%. Xopenex and Atrovent nebulizers as well as Symbicort were continued. 2-D echocardiogram was ordered and it revealed grade 1 diastolic dysfunction, but preserved left ventricular systolic function. Because of the prolonged hypoxia, CT angiogram of the chest was ordered to rule out PE. It was essentially negative for PE, but it did reveal basilar pneumonia. She was subsequently started on azithromycin and Rocephin. A couple of ABGs were ordered. The first ABG on 12/16/13 revealed a pH of 7.4, PCO2 of 52, and PO2 of 53 on 40% oxygen. The oxygen tubing was checked and was found to be working properly. The respiratory therapist felt that this ABG was arterial and not venous. Nevertheless, another ABG was ordered with the patient not supine and  sitting up. It revealed a pH of 7.47, PCO2 of 45, and PO2 of 72 on 40-50% of oxygen.  The patient continued to have severe hypoxemia, requiring at least 6 L of nasal cannula oxygen. It was noted that she did desaturate during sleep which is likely indicative of obstructive sleep apnea. She is also morbidly obese which could be contributing to obesity hypoventilation syndrome. It is also important to note, that the patient was always alert and oriented even when her oxygen saturations fell into the 80s. Another CT of the chest was ordered, but it was noncontrasted to assess for effusions and residual pneumonia. The results revealed significant decrease in bilateral pleural effusions and decrease in bilateral lower lobe pneumonia. It did reveal a 7 mm right lower lobe pulmonary nodule which will need to be followed up with another CT of the chest in 6-12 months. At the time of discharge, her oxygen saturation on room air was 82% and her oxygen saturation on 6 L of nasal cannula oxygen was 92%. She was discharged with home oxygen at 6 L per minute.   Bilateral pleural effusions.  The bilateral pleural effusions were thought to be secondary to decompensated diastolic heart failure initially. She was diuresed with IV Lasix. Her 2-D echocardiogram revealed diastolic dysfunction, but with preserved left ventricular systolic function. A thoracentesis was ordered and only yielded 10 cc of yellow fluid; per radiology, there was an anatomical concern which limited further drainage. The fluid cell count was indicative of a parapneumonic effusion and less likely a transudate effusion. As stated above, the followup CT reveals significant decrease in bibasilar pleural effusions. She diuresed over 4 L during the hospitalization. I believe the effusions are secondary to both pneumonia and and does parapneumonic and decompensated diastolic heart failure.  Acute on chronic diastolic heart failure.  She was diuresed on IV Lasix. After several days of diuresis, Lasix was transitioned to by mouth. She diuresed, at one  point, over 5 L, but -4.5 L at the time of discharge. Her 2-D echocardiogram revealed grade 1 diastolic dysfunction and ejection fraction of 60-65%. Also, her left atrium was moderately to severely dilated. At the time of discharge, her peripheral edema was significantly decreased. She was discharged to home on a slightly higher dose of Lasix at 80 mg in the morning and 40 mg in the afternoon (she had been taking 40 mg twice a day at home).  Newly diagnosed atrial fibrillation.  The patient was noted to have atrial fibrillation per EKG on admission. This persisted throughout the hospitalization. She was maintained on beta blocker therapy. Her rate was controlled. Given the patient's heart failure, hypertension, and age, anticoagulation was started. This was discussed via a curbside consultation with Dr. Purvis Sheffield who agreed. Coumadin was started on 12/13/2013.  Her INR was 1.2 for the time of discharge. She was discharged on 7.5 mg of warfarin. She is scheduled for her first appointment with the [facility] in a few days. Hypertension.  Her blood pressure, for the most part, was controlled, but at times it was labile. Losartan, Procardia, metoprolol dosing had to be adjusted several times to avoid symptomatic hypotension. At the time of discharge, the dose of losartan and metoprolol were decreased and Procardia dosing remain the same. Further evaluation and management per her PCP.  Bibasilar pneumonia. The patient was started on IV Rocephin and azithromycin for several days. She remained afebrile and without leukocytosis during most of the hospitalization. She was discharged on several more  days of Ceftin.  COPD.  She was treated with Xopenex and Atrovent nebulizers. Symbicort was continued. She had no significant bronchospasms during hospitalization.  Urinary tract infection.  The patient was  asymptomatic. Urine culture grew out Escherichia coli which was susceptible to ceftriaxone.  Acute  renal insufficiency; likely secondary to diuresis.  Her renal function has improved following adjustments in lasix dosing.  Bilateral lower extremity edema with venous stasis.  She is being treated with Profore compression dressings. This was continued with the assistance of physical therapy.  Macrocytosis. TSH/free T4 was within normal limits. Her vitamin B12 level was within normal limits.  Great toe gout. She received S/P IV Solu-Medrol x 1 and a mild prednisone taper. She became asymptomatic.       Procedures:  1.) 12/11/13. 2-D echocardiogram:Study Conclusions  - Procedure narrative: Transthoracic echocardiography. Image quality was suboptimal. The study was technically difficult, as a result of poor sound wave transmission and body habitus. - Left ventricle: The cavity size was normal. Wall thickness was increased in a pattern of moderate LVH. Systolic function was normal. The estimated ejection fraction was in the range of 60% to 65%. Doppler parameters are consistent with abnormal left ventricular relaxation (grade 1 diastolic dysfunction). Doppler parameters are consistent with both elevated ventricular end-diastolic filling pressure and elevated left atrial filling pressure. - Aortic valve: Mildly calcified annulus. Mildly thickened, mildly calcified leaflets. There wasborderline stenosis. - Mitral valve: Mild to moderate mitral stenosis. Calcified annulus. Mildly thickened leaflets . Trivial regurgitation. Mean gradient: 8mm Hg (D). Valve area by pressure half-time: 1.73cm^2. - Left atrium: The atrium was moderately to severely dilated. - Right atrium: The atrium was mildly dilated. - Pulmonary arteries: Inadequate spectral Doppler profile to accurately assess pulmonary pressures.  2.) 12/10/13 ultrasound-guided thoracentesis, yielding 10 cc of yellow fluid.  3.) BiPAP 12/10/13.   Consultations:  (curbside consult with Dr.Koneswaran)   Discharge Exam: Filed Vitals:    12/18/13 0430  BP:   Pulse:   Temp: 98 F (36.7 C)  Resp: 20  BP 117/88. RR 20 02 sat 92% on 6 L 02  General: Pleasant obese 78 roll Caucasian woman sitting up in bed, in no acute distress.  Lungs: Clear in the upper lobes and decreased breath sounds in the bases. No active audible wheezes. Breathing is nonlabored.  Heart: Irregular, irregular.  Abdomen: Positive bowel sounds, soft, mildly obese, nontender, nondistended.  Extremities/musculoskeletal: Right great toe mild erythema and non-- tender. Trace of bilateral lower extremity edema. Profore dressing/wraps in place bilaterally. (Not taken off).  Neurologic: She is alert and oriented to herself and hospital. Her speech is clear. Cranial nerves II through XII are grossly intact.    Discharge Instructions      Discharge Orders   Future Appointments Provider Department Dept Phone   12/21/2013 1:10 PM [facility] [phone]   Future Orders Complete By Expires   Diet - low sodium heart healthy  As directed    Discharge instructions  As directed    Comments:     Take your medications as prescribed. Some of your blood pressure medicine doses have been changed. Followup with your primary care provider for reassessment of your blood pressure. Followup with the Coumadin clinic as scheduled. Wear your oxygen as close to 24 hours daily as possible.   Discharge wound care:  As directed    Comments:     Continue previous Profore dressing changes on legs.   Increase activity slowly  As directed  Medication List    STOP taking these medications       naproxen sodium 220 MG tablet  Commonly known as:  ANAPROX      TAKE these medications       albuterol 108 (90 BASE) MCG/ACT inhaler  Commonly known as:  PROVENTIL HFA;VENTOLIN HFA  Inhale 2 puffs into the lungs 2 (two) times daily.     allopurinol 300 MG tablet  Commonly known as:  ZYLOPRIM  Take 300 mg by mouth daily.     cefUROXime  500 MG tablet  Commonly known as:  CEFTIN  Take 1 tablet (500 mg total) by mouth 2 (two) times daily with a meal. ANTIBIOTIC TO BE TAKEN FOR 3 MORE DAYS.     Fluticasone-Salmeterol 250-50 MCG/DOSE Aepb  Commonly known as:  ADVAIR  Inhale 1 puff into the lungs 2 (two) times daily.     furosemide 40 MG tablet  Commonly known as:  LASIX  TAKE 2 TABLETS IN THE MORNING AND 1 TABLET IN THE AFTERNOON.     gabapentin 300 MG capsule  Commonly known as:  NEURONTIN  Take 300 mg by mouth at bedtime.     GAS RELIEF PO  Take 1 tablet by mouth daily.     HYDROcodone-acetaminophen 5-325 MG per tablet  Commonly known as:  NORCO/VICODIN  Take 0.5 tablets by mouth daily as needed for moderate pain.     losartan 100 MG tablet  Commonly known as:  COZAAR  Take 0.5 tablets (50 mg total) by mouth daily.     metoprolol 50 MG tablet  Commonly known as:  LOPRESSOR  Take 1 tablet (50 mg total) by mouth 2 (two) times daily. Take 1 tablet in the morning and 1 tablet in the evening.     NIFEdipine 30 MG 24 hr tablet  Commonly known as:  PROCARDIA-XL/ADALAT-CC/NIFEDICAL-XL  Take 30 mg by mouth daily.     predniSONE 20 MG tablet  Commonly known as:  DELTASONE  Take 1 tablet (20 mg total) by mouth daily as needed (For gout pain).     rosuvastatin 40 MG tablet  Commonly known as:  CRESTOR  Take 40 mg by mouth daily.     warfarin 5 MG tablet  Commonly known as:  COUMADIN  Take 1.5 tablets (7.5 mg total) by mouth daily. Take 1 and a 1/2 tablets each evening. Further dosing will be given to you by the Coumadin clinic.       No Known Allergies Follow-up Information   Follow up with [facility].   Contact information:   [address] [place] [address] [phone]       Follow up with [facility] On 12/21/2013. (At 1:10 PM. Follow up at the coumadin clinic for blood testing)    Contact information:   [address] [place] [address]       Follow  up with Ninfa Linden, FNP. Schedule an appointment as soon as possible for a visit in 1 week.   Specialty:  Nurse Practitioner   Contact information:   [address] [place] [address] [phone]        The results of significant diagnostics from this hospitalization (including imaging, microbiology, ancillary and laboratory) are listed below for reference.    Significant Diagnostic Studies: Ct Angio Chest Pe W/cm &/or Wo Cm  12/12/2013   CLINICAL DATA:  Hypoxia, acute respiratory failure, history of COPD  EXAM: CT ANGIOGRAPHY CHEST WITH CONTRAST  TECHNIQUE: Multidetector CT imaging of the chest was performed using the standard protocol during bolus administration of intravenous contrast. Multiplanar CT image reconstructions including MIPs were obtained to evaluate the vascular anatomy.  CONTRAST:  80mL OMNIPAQUE IOHEXOL 350 MG/ML SOLN  COMPARISON:  DG CHEST PORT 1VSAME DAY dated 12/11/2013  FINDINGS: Contrast within the pulmonary arterial tree is normal in appearance. There are no filling defects to suggest acute pulmonary embolism. There is a moderate-sized right pleural effusion and smaller left pleural effusion. There is dense infiltrate within both lower lobes posteriorly. The upper lobes are spared. The cardiac chambers are top-normal in size. There is no pericardial effusion. The caliber of the thoracic aorta is normal. No bulky mediastinal or hilar lymph nodes are evident.  The observed portions of the thoracic spine exhibit no acute abnormalities. There is degenerative disc change inferiorly. The sternum appears intact. Within the upper abdomen the observed portions of the liver and spleen appear normal.  Review of the MIP images confirms the above findings.  IMPRESSION: 1. There is no evidence of an acute pulmonary embolism or acute thoracic aortic pathology. 2. Bibasilar pneumonia is present with a moderate-sized right pleural effusion and smaller left pleural effusion.  3. There is no evidence of CHF. No bulky mediastinal or hilar lymph nodes are evident.   Electronically Signed   By: David  [place]   On: 12/12/2013 11:36   Dg Chest Port 1 View  12/15/2013   CLINICAL DATA:  Pneumonia and effusion.  EXAM: PORTABLE CHEST - 1 VIEW  COMPARISON:  12/11/2013 and 12/12/2013  FINDINGS: Again noted are low lung volumes with bibasilar densities. Based on the recent CT, these basilar densities are related pleural fluid. Left pleural effusion may have slightly enlarged but the right pleural effusion may be smaller. No evidence for pulmonary edema or upper lung airspace disease. The heart is obscured by the basilar lung densities. Again noted is remodeling and sclerosis in the proximal left humerus, probably posttraumatic in etiology.  IMPRESSION: Low lung volumes with persistent bibasilar densities. Findings are consistent with small pleural effusions and minimal change from the previous examinations.   Electronically Signed   By: Richarda Overlie M.D.   On: 12/15/2013 08:29   Dg Chest Port 1 View  12/11/2013   CLINICAL DATA:  Shortness of breath  EXAM: PORTABLE CHEST - 1 VIEW  COMPARISON:  12/10/2013  FINDINGS: The cardiac shadow is stable. Bilateral pleural effusions are again seen. Mild vascular congestion is also again seen.  IMPRESSION: No change from the previous exam. Vascular congestion and pleural effusions are stable.   Electronically Signed   By: Alcide Clever M.D.   On: 12/11/2013 09:39   Dg Chest Portable 1 View  12/10/2013   CLINICAL DATA:  Bilateral leg swelling.  Productive cough.  EXAM: PORTABLE CHEST - 1 VIEW  COMPARISON:  10/19/2008  FINDINGS: Opacity obscures the hemidiaphragms and portions of the heart borders, right greater than left. This consistent with effusions with lung base opacity that may reflect atelectasis or infiltrate. There is vascular congestion centrally.  No pneumothorax.  IMPRESSION: 1. Right greater than left pleural effusions with associated parenchymal  opacity, which may reflect atelectasis or infiltrate. There is vascular congestion without overt edema.   Electronically Signed   By: Amie Portland M.D.   On: 12/10/2013 10:56   Dg Chest Port 1v Same Day  12/11/2013   CLINICAL DATA:  Post thoracentesis  EXAM: PORTABLE CHEST - 1 VIEW SAME DAY  COMPARISON:  12/11/2013 at 0916 hr  FINDINGS: Small bilateral pleural effusions, grossly unchanged. Right pleural effusion has decreased from 12/10/2013.  No pneumothorax is seen status post right thoracentesis.  Associated lower lobe opacities, right greater than left, favored to reflect atelectasis. Right lower lobe pneumonia is not excluded.  Cardiomegaly with mild interstitial edema.  IMPRESSION: No pneumothorax is seen status post right thoracentesis.  Otherwise unchanged.   Electronically Signed   By: Charline Bills M.D.   On: 12/11/2013 14:13   US Thoracentesis Asp Pleural Space W/img Guide  12/11/2013   CLINICAL DATA:  Pleural effusion  EXAM: ULTRASOUND GUIDED RIGHT THORACENTESIS  COMPARISON:  None.  PROCEDURE: An ultrasound guided thoracentesis was thoroughly discussed with the patient and questions answered. The benefits, risks, alternatives and complications were also discussed. The patient understands and wishes to proceed with the procedure. Written consent was obtained.  Ultrasound was performed to localize and mark an adequate pocket of fluid in the right chest. The area was then prepped and draped in the normal sterile fashion. 1% Lidocaine was used for local anesthesia. Under ultrasound guidance a 19 gauge Yueh catheter was introduced. Thoracentesis was performed. The catheter was removed and a dressing applied.  Complications:  None.  FINDINGS: While the patient had at least a small pleural effusion, on real-time imaging, atelectatic lung was mobile within the fluid pocket, at times extending near the skin surface.  As a result, only a small amount of pleural fluid could be safely removed.  A total of  approximately 10 mL of yellow fluid was removed. A fluid sample was maintained for possible laboratory analysis is desired.  IMPRESSION: Successful ultrasound guided right thoracentesis yielding 10 mL of pleural fluid.  Additional fluid could not be safely withdrawn at this time due to adjacent atelectatic lung.   Electronically Signed   By: Charline Bills M.D.   On: 12/11/2013 14:27    Microbiology: Recent Results (from the past 240 hour(s))  URINE CULTURE     Status: None   Collection Time    12/10/13 10:58 AM      Result Value Range Status   Specimen Description URINE, CLEAN CATCH   Final   Special Requests NONE   Final   Culture  Setup Time     Final   Value: 12/10/2013 21:46     Performed at [facility] Count     Final   Value: >=100,000 COLONIES/ML     Performed at [facility]   Culture     Final   Value: ESCHERICHIA COLI     Performed at [facility]   Report Status 12/13/2013 FINAL   Final   Organism ID, Bacteria ESCHERICHIA COLI   Final  MRSA PCR SCREENING     Status: None   Collection Time    12/10/13  5:55 PM      Result Value Range Status   MRSA by PCR NEGATIVE  NEGATIVE Final   Comment:            The GeneXpert MRSA Assay (FDA     approved for NASAL specimens     only), is one component of a     comprehensive MRSA colonization     surveillance program. It is not     intended to diagnose MRSA     infection nor to guide or     monitor treatment for     MRSA infections.  BODY FLUID CULTURE  Status: None   Collection Time    12/11/13  1:52 PM      Result Value Range Status   Specimen Description FLUID RIGHT PLEURAL   Final   Special Requests NONE   Final   Gram Stain     Final   Value: CYTOSPIN ABUNDANT WBC PRESENT, PREDOMINANTLY MONONUCLEAR     NO ORGANISMS SEEN     Performed at [facility]     Performed at [facility]   Culture     Final   Value: NO GROWTH 3 DAYS     Performed at [facility]   Report Status 12/15/2013 FINAL   Final     Labs: Basic Metabolic Panel:  Recent Labs Lab 12/12/13 0459 12/13/13 0533 12/14/13 0500 12/15/13 0518  NA 148* 145 142 142  K 4.0 4.2 4.8 4.4  CL 102 101 99 98  CO2 39* 34* 35* 35*  GLUCOSE 113* 111* 132* 111*  BUN 44* 38* 38* 37*  CREATININE 1.40* 1.18* 1.27* 1.06  CALCIUM 8.8 9.0 9.1 9.3   Liver Function Tests: No results found for this basename: AST, ALT, ALKPHOS, BILITOT, PROT, ALBUMIN,  in the last 168 hours No results found for this basename: LIPASE, AMYLASE,  in the last 168 hours No results found for this basename: AMMONIA,  in the last 168 hours CBC:  Recent Labs Lab 12/13/13 0904 12/14/13 0500 12/15/13 0518  WBC 6.2 5.8 6.8  HGB 14.3 13.7 14.3  HCT 46.3* 43.9 44.7  MCV 105.2* 104.0* 103.0*  PLT 159 144* 157   Cardiac Enzymes: No results found for this basename: CKTOTAL, CKMB, CKMBINDEX, TROPONINI,  in the last 168 hours BNP: BNP (last 3 results)  Recent Labs  12/10/13 1130  PROBNP 5669.0*   CBG: No results found for this basename: GLUCAP,  in the last 168 hours     Signed:  Yechiel Erny  [facility] 12/18/2013, 7:26 AM

## 2013-12-16 NOTE — Progress Notes (Signed)
[facility] PROGRESS NOTE  Paula Fletcher ZOX:[id number] DOB: 06/23/1935 DOA: 12/10/2013 PCP: Ninfa Linden, FNP    Code Status: Full code Family Communication: . Disposition Plan: Discharge to home when clinically appropriate, possibly tomorrow with home health services.   Consultants:  None (curbside consult with Dr.Koneswaran)  Procedures: 1.) 12/11/13.  2-D echocardiogram:Study Conclusions  - Procedure narrative: Transthoracic echocardiography. Image quality was suboptimal. The study was technically difficult, as a result of poor sound wave transmission and body habitus. - Left ventricle: The cavity size was normal. Wall thickness was increased in a pattern of moderate LVH. Systolic function was normal. The estimated ejection fraction was in the range of 60% to 65%. Doppler parameters are consistent with abnormal left ventricular relaxation (grade 1 diastolic dysfunction). Doppler parameters are consistent with both elevated ventricular end-diastolic filling pressure and elevated left atrial filling pressure. - Aortic valve: Mildly calcified annulus. Mildly thickened, mildly calcified leaflets. There wasborderline stenosis. - Mitral valve: Mild to moderate mitral stenosis. Calcified annulus. Mildly thickened leaflets . Trivial regurgitation. Mean gradient: 8mm Hg (D). Valve area by pressure half-time: 1.73cm^2. - Left atrium: The atrium was moderately to severely dilated. - Right atrium: The atrium was mildly dilated. - Pulmonary arteries: Inadequate spectral Doppler profile to accurately assess pulmonary pressures.  2.) 12/10/13 ultrasound-guided thoracentesis, yielding 10 cc of yellow fluid. 3.) BiPAP 12/10/13.  Antibiotics:  Ceftin 12/15/13>>  Azithromycin 12/12/13>>12/15/13  Rocephin 12/12/13>>12/15/13  HPI/Subjective: Nursing reports that the patient's oxygen saturation decreased to the 60s to 70s on 6 L of oxygen. She was treated with a nonrebreather and then  titrated to a Venturi mask. The patient is sitting up in bed. She denies shortness of breath, chest pain, or pleurisy.  Objective: Filed Vitals:   12/16/13 1421  BP: 113/64  Pulse: 60  Temp: 98.1 F (36.7 C)  Resp: 20   Oxygen saturation in the 92 percent on 40% Venturi mask.   Intake/Output Summary (Last 24 hours) at 12/16/13 1525 Last data filed at 12/16/13 0917  Gross per 24 hour  Intake    240 ml  Output    450 ml  Net   -210 ml   Filed Weights   12/14/13 0500 12/15/13 0524 12/16/13 0444  Weight: 113.3 kg (249 lb 12.5 oz) 114.8 kg (253 lb 1.4 oz) 116.62 kg (257 lb 1.6 oz)    Exam:  General:  Pleasant obese 78 roll Caucasian woman sitting up in bed, in no acute distress. Lungs: Clear in the upper lobes and decreased breath sounds in the bases. No active audible wheezes. Breathing is nonlabored. Heart: Irregular, irregular. Abdomen: Positive bowel sounds, soft, mildly obese, nontender, nondistended. Extremities/musculoskeletal:  Right great toe mild erythema and non-- tender. Trace of bilateral lower extremity edema. Profore dressing/wraps in place bilaterally. (Not taken off). Neurologic: She is alert and oriented to herself and hospital. Her speech is clear. Cranial nerves II through XII are grossly intact.  Data Reviewed: Basic Metabolic Panel:  Recent Labs Lab 12/10/13 1130 12/11/13 0654 12/12/13 0459 12/13/13 0533 12/14/13 0500 12/15/13 0518  NA 144 146 148* 145 142 142  K 4.0 4.0 4.0 4.2 4.8 4.4  CL 101 102 102 101 99 98  CO2 32 34* 39* 34* 35* 35*  GLUCOSE 132* 141* 113* 111* 132* 111*  BUN 23 35* 44* 38* 38* 37*  CREATININE 1.11* 1.37* 1.40* 1.18* 1.27* 1.06  CALCIUM 9.1 9.0 8.8 9.0 9.1 9.3  MG 1.9  --   --   --   --   --  Liver Function Tests:  Recent Labs Lab 12/10/13 1130  AST 23  ALT 21  ALKPHOS 101  BILITOT 0.6  PROT 6.6  ALBUMIN 3.1*   No results found for this basename: LIPASE, AMYLASE,  in the last 168 hours No results found  for this basename: AMMONIA,  in the last 168 hours CBC:  Recent Labs Lab 12/10/13 1052 12/13/13 0904 12/14/13 0500 12/15/13 0518  WBC 6.4 6.2 5.8 6.8  NEUTROABS 4.3  --   --   --   HGB 14.5 14.3 13.7 14.3  HCT 46.6* 46.3* 43.9 44.7  MCV 104.3* 105.2* 104.0* 103.0*  PLT 185 159 144* 157   Cardiac Enzymes:  Recent Labs Lab 12/10/13 1213 12/10/13 1834 12/11/13 0045 12/11/13 0654  TROPONINI <0.30 <0.30 0.32* <0.30   BNP (last 3 results)  Recent Labs  12/10/13 1130  PROBNP 5669.0*   CBG: No results found for this basename: GLUCAP,  in the last 168 hours  Recent Results (from the past 240 hour(s))  URINE CULTURE     Status: None   Collection Time    12/10/13 10:58 AM      Result Value Range Status   Specimen Description URINE, CLEAN CATCH   Final   Special Requests NONE   Final   Culture  Setup Time     Final   Value: 12/10/2013 21:46     Performed at [facility]   Colony Count     Final   Value: >=100,000 COLONIES/ML     Performed at [facility]   Culture     Final   Value: ESCHERICHIA COLI     Performed at [facility]   Report Status 12/13/2013 FINAL   Final   Organism ID, Bacteria ESCHERICHIA COLI   Final  MRSA PCR SCREENING     Status: None   Collection Time    12/10/13  5:55 PM      Result Value Range Status   MRSA by PCR NEGATIVE  NEGATIVE Final   Comment:            The GeneXpert MRSA Assay (FDA     approved for NASAL specimens     only), is one component of a     comprehensive MRSA colonization     surveillance program. It is not     intended to diagnose MRSA     infection nor to guide or     monitor treatment for     MRSA infections.  BODY FLUID CULTURE     Status: None   Collection Time    12/11/13  1:52 PM      Result Value Range Status   Specimen Description FLUID RIGHT PLEURAL   Final   Special Requests NONE   Final   Gram Stain     Final   Value: CYTOSPIN ABUNDANT WBC PRESENT, PREDOMINANTLY MONONUCLEAR      NO ORGANISMS SEEN     Performed at [facility]     Performed at [facility]   Culture     Final   Value: NO GROWTH 3 DAYS     Performed at [facility]   Report Status 12/15/2013 FINAL   Final     Studies: Ct Chest Wo Contrast  12/16/2013   CLINICAL DATA:  Shortness of breath. Followup bibasilar pneumonia and bilateral pleural effusions.  EXAM: CT CHEST WITHOUT CONTRAST  TECHNIQUE: Multidetector CT imaging of the chest was performed following the standard  protocol without IV contrast.  COMPARISON:  Chest CT dated 12/12/2013 and portable chest dated 12/15/2013.  FINDINGS: Small bilateral pleural effusions, significantly decreased. Less adjacent bilateral lower lobe atelectasis and consolidation. Interval visualization of a 7 mm noncalcified nodule in the lateral aspect of the right lower lobe on image number 24. This may have been previously obscured by adjacent airspace opacity. No other lung nodules and no enlarged lymph nodes. Stable mitral valve annulus calcifications. Unremarkable upper abdomen. Thoracic spine degenerative changes. Atheromatous arterial calcifications.  IMPRESSION: 1. Small bilateral pleural effusions, significantly decreased. 2. Decreased bilateral lower lobe atelectasis and possible pneumonia. 3. Interval visualization of a 7 mm right lower lobe pulmonary nodule. If the patient is at high risk for bronchogenic carcinoma, follow-up chest CT at 3-6 months is recommended. If the patient is at low risk for bronchogenic carcinoma, follow-up chest CT at 6-12 months is recommended. This recommendation follows the consensus statement: Guidelines for Management of Small Pulmonary Nodules Detected on CT Scans: A Statement from the Fleischner Society as published in Radiology 2005; 237:395-400.   Electronically Signed   By: Gordan Payment M.D.   On: 12/16/2013 15:04   Dg Chest Port 1 View  12/15/2013   CLINICAL DATA:  Pneumonia and effusion.  EXAM: PORTABLE CHEST - 1  VIEW  COMPARISON:  12/11/2013 and 12/12/2013  FINDINGS: Again noted are low lung volumes with bibasilar densities. Based on the recent CT, these basilar densities are related pleural fluid. Left pleural effusion may have slightly enlarged but the right pleural effusion may be smaller. No evidence for pulmonary edema or upper lung airspace disease. The heart is obscured by the basilar lung densities. Again noted is remodeling and sclerosis in the proximal left humerus, probably posttraumatic in etiology.  IMPRESSION: Low lung volumes with persistent bibasilar densities. Findings are consistent with small pleural effusions and minimal change from the previous examinations.   Electronically Signed   By: Richarda Overlie M.D.   On: 12/15/2013 08:29    Scheduled Meds: . allopurinol  100 mg Oral Daily  . antiseptic oral rinse  15 mL Mouth Rinse q12n4p  . atorvastatin  10 mg Oral q1800  . cefUROXime  500 mg Oral BID WC  . furosemide  20 mg Oral BID  . gabapentin  300 mg Oral QHS  . heparin  5,000 Units Subcutaneous Q8H  . levalbuterol  0.63 mg Nebulization TID  . metoprolol tartrate  25 mg Oral BID  . mometasone-formoterol  2 puff Inhalation BID  . NIFEdipine  30 mg Oral QHS  . predniSONE  20 mg Oral Q breakfast  . sodium chloride  3 mL Intravenous Q12H  . Warfarin - Pharmacist Dosing Inpatient   Does not apply Q24H   Continuous Infusions:    Assessment: Principal Problem:   Acute respiratory failure with hypoxia Active Problems:   Pleural effusion, bilateral   CAP (community acquired pneumonia)   Acute on chronic diastolic heart failure   Essential hypertension   COPD (chronic obstructive pulmonary disease)   Atrial fibrillation   UTI (urinary tract infection)   Lower extremity venous stasis   Acute renal insufficiency   1. Acute respiratory failure with hypoxia; secondary to a combination of bilateral pleural effusions, diastolic heart failure, and  basilar pneumonia. CT angiogram  essentially negative for PE. Followup noncontrasted CT pending to evaluate worsening hypoxemia. Initial ABG this morning reveal severe hypoxia on 40-to 50%. It was repeated with a somewhat better p02 of 72. She may have obstructive  sleep apnea which may have been the explanation for her worsening hypoxia while sleeping. I was concerned about whether the tubing was leaking oxygen as the patient was completely alert and oriented this morning with a PO2 of 53.. This was discussed with respiratory therapist, Zella Ball. We'll continue to monitor and evaluate again tomorrow.  Bilateral pleural effusions. I believe the effusions are secondary to both pneumonia and and does parapneumonic and decompensated diastolic heart failure. Thoracentesis only yielded 10 cc of yellow fluid; per radiology, anatomical concern limited further drainage. She is diuresed over 4.5 L to date. Lasix has been transitioned to 40 mg orally daily. We'll discontinue her Foley catheter.  Acute on chronic diastolic heart failure. She is noted to have grade 1 diastolic dysfunction and ejection fraction of 60-65%. Also, her left atrium is moderately to severely dilated. As above, she is diuresed over 4.5 L to date. She is approaching compensation clinically. Lasix has been changed to 40 mg by mouth daily.  Newly diagnosed atrial fibrillation. Given the patient's heart failure, hypertension, and age, anticoagulation was started. This was discussed via a curbside consultation with Dr. Purvis Sheffield who agreed. Coumadin was started 12/13/2013. This was discussed with the patient. Her heart rate is controlled. Pharmacy is assisting with management of warfarin.  Hypertension. Her blood pressure is labile. When her antihypertensive medications were titrated up, she becomes relatively ready cardiac and hypotensive. When they're titrated down to low, she becomes hypertensive. We will continue to adjust her medications accordingly. Metoprolol decreased again and  losartan will be held. Continue Procardia for now.   Bibasilar pneumonia. We'll continue Ceftin. Status post IV Rocephin and azithromycin. Will add incentive spirometry to help with aeration.  COPD. Currently stable on Xopenex and Symbicort.  Urinary tract infection. The patient is asymptomatic. Urine culture grew out Escherichia coli. Continue Ceftin.  Acute renal insufficiency; likely secondary to diuresis. Her renal function has improved.  Bilateral lower extremity edema with venous stasis. She is being treated with compression dressings. This is chronic. It appears that her edema has subsided.  Macrocytosis.  TSH/free T4 was within normal limits. Her vitamin B12 level was within normal limits.  Great toe gout. S/P  IV Solu-Medrol x 1. Will continue a mild prednisone taper. She is now asymptomatic.        Plan: 1. We'll continue to titrate oxygen, but I have asked the respiratory therapist to check the oxygen apparatus to make sure that is flowing. 2. CT scan of the chest, noncontrasted, ordered and now reviewed. 3. We'll asked the nursing staff to keep the patient out of bed to the chair for several hours daily and to encourage the patient to use incentive spirometry. 4. Delay discharge until tomorrow.     Time spent: 20 minutes.    [facility]  [facility] Pager [phone]. If 7PM-7AM, please contact night-coverage at [email or web address], password [facility] 12/16/2013, 3:24 PM  LOS: 6 days

## 2013-12-16 NOTE — Progress Notes (Signed)
Contacted RT because patient had low O2 saturation. Could not get sats above 90%. RT put NRB mask on patient. Sats improved to 93%. Will continue to monitor.

## 2013-12-17 LAB — PROTIME-INR
INR: 1.24 (ref 0.00–1.49)
Prothrombin Time: 15.3 seconds — ABNORMAL HIGH (ref 11.6–15.2)

## 2013-12-17 MED ORDER — FUROSEMIDE 40 MG PO TABS: ORAL_TABLET | ORAL | Status: DC

## 2013-12-17 MED ORDER — WARFARIN SODIUM 10 MG PO TABS
10.0000 mg | ORAL_TABLET | Freq: Once | ORAL | Status: AC
  Administered 2013-12-17: 10 mg via ORAL
  Filled 2013-12-17: qty 1

## 2013-12-17 MED ORDER — PREDNISONE 20 MG PO TABS
20.0000 mg | ORAL_TABLET | Freq: Every day | ORAL | Status: DC | PRN
Start: 2013-12-17 — End: 2013-12-17

## 2013-12-17 MED ORDER — ALBUTEROL SULFATE HFA 108 (90 BASE) MCG/ACT IN AERS: 2.0000 | INHALATION_SPRAY | Freq: Two times a day (BID) | RESPIRATORY_TRACT | Status: AC

## 2013-12-17 MED ORDER — WARFARIN SODIUM 5 MG PO TABS
7.5000 mg | ORAL_TABLET | Freq: Every day | ORAL | Status: AC
Start: 2013-12-17 — End: ?

## 2013-12-17 MED ORDER — CEFUROXIME AXETIL 500 MG PO TABS: 500.0000 mg | ORAL_TABLET | Freq: Two times a day (BID) | ORAL | Status: DC

## 2013-12-17 MED ORDER — LOSARTAN POTASSIUM 100 MG PO TABS: 50.0000 mg | ORAL_TABLET | Freq: Every day | ORAL | Status: AC

## 2013-12-17 MED ORDER — METOPROLOL TARTRATE 50 MG PO TABS: 50.0000 mg | ORAL_TABLET | Freq: Two times a day (BID) | ORAL | Status: AC

## 2013-12-17 MED ORDER — FUROSEMIDE 40 MG PO TABS: 40.0000 mg | ORAL_TABLET | Freq: Two times a day (BID) | ORAL | Status: DC

## 2013-12-17 MED ORDER — PREDNISONE 20 MG PO TABS: 20.0000 mg | ORAL_TABLET | Freq: Every day | ORAL | Status: AC | PRN

## 2013-12-17 MED ORDER — FUROSEMIDE 80 MG PO TABS
80.0000 mg | ORAL_TABLET | Freq: Every day | ORAL | Status: DC
  Administered 2013-12-17 – 2013-12-18 (×2): 80 mg via ORAL
  Filled 2013-12-17 (×2): qty 1

## 2013-12-17 MED ORDER — FUROSEMIDE 10 MG/ML IJ SOLN
20.0000 mg | Freq: Once | INTRAMUSCULAR | Status: DC
  Filled 2013-12-17: qty 2

## 2013-12-17 NOTE — Progress Notes (Signed)
ANTICOAGULATION CONSULT NOTE  Pharmacy Consult for Coumadin Indication: atrial fibrillation  No Known Allergies  Patient Measurements: Height: 5\' 8"  (172.7 cm) Weight: 257 lb 1.6 oz (116.62 kg) IBW/kg (Calculated) : 63.9  Vital Signs: Temp: 97.6 F (36.4 C) (02/08 0616) BP: 136/71 mmHg (02/08 0846) Pulse Rate: 89 (02/08 0846)  Labs:  Recent Labs  12/15/13 0518 12/16/13 0500 12/17/13 0625  HGB 14.3  --   --   HCT 44.7  --   --   PLT 157  --   --   LABPROT 12.9 13.8 15.3*  INR 0.99 1.08 1.24  CREATININE 1.06  --   --    Estimated Creatinine Clearance: 60.6 ml/min (by C-G formula based on Cr of 1.06).  Medical History: Past Medical History  Diagnosis Date  . COPD (chronic obstructive pulmonary disease)   . Hypertension   . Hyperlipidemia   . Gout   . Poor historian   . Recurrent right pleural effusion 12/10/2013  . CAP (community acquired pneumonia) 12/13/2013  . Acute on chronic diastolic heart failure 12/13/2013  . Lower extremity venous stasis 12/13/2013   Medications:  Scheduled:  . allopurinol  100 mg Oral Daily  . antiseptic oral rinse  15 mL Mouth Rinse q12n4p  . atorvastatin  10 mg Oral q1800  . cefUROXime  500 mg Oral BID WC  . furosemide  80 mg Oral Daily  . gabapentin  300 mg Oral QHS  . heparin  5,000 Units Subcutaneous Q8H  . levalbuterol  0.63 mg Nebulization TID  . metoprolol tartrate  25 mg Oral BID  . mometasone-formoterol  2 puff Inhalation BID  . NIFEdipine  30 mg Oral QHS  . predniSONE  20 mg Oral Q breakfast  . sodium chloride  3 mL Intravenous Q12H  . Warfarin - Pharmacist Dosing Inpatient   Does not apply Q24H   Assessment: 79yo female with new onset atrial fibrillation.  Initiating Coumadin anticoagulation for CHADS2Vasc score of 5 (HF, HTN, female, age>75).   INR slowly rising to goal.  No bleeding noted.   Goal of Therapy:  INR 2-3   Plan:  Coumadin 10 mg po x1 now INR daily Continue SQ Heparin until INR >2 or discharge to  home If discharge home, consider 7.5-10 mg daily and re-check INR next week Patient educated on Coumadin  Elson ClanLilliston, Lynnea Vandervoort Michelle 12/17/2013,9:36 AM

## 2013-12-17 NOTE — Progress Notes (Signed)
Contacted [facility] regarding oxygen.  Communicated with [employer] Customer service representative regarding oxygen delivery.  Daniel received the faxed order for Ms. Rosero's oxygen and confirmed information for processing.   Provided the charge nurse direct contact number for delivery person to contact once arrive to the hospital and confirmed the patient hospital number. Representative informed CM that [facility] was having technical problems regarding system but they are open 24 hours for customer service and delivery is too.  Another number provided to contact is [phone].

## 2013-12-17 NOTE — Progress Notes (Signed)
Patient setup called already with [facility].

## 2013-12-17 NOTE — Progress Notes (Signed)
Contacted [facility] spoke with [employer] customer service representative regarding delivery of oxygen for patient because patient is getting discharged from hospital and it is urgently needed.  Patient has [employer] coverage. Provided order information for patient to use oxygen and will have order faxed to [phone].  The direct number to the customer service representative [phone].  Customer Service representative unsure of what time person to have the oxygen delivered here to the hospital. The patient should receive a portable unit to travel home and a unit set up at residence.

## 2013-12-17 NOTE — Progress Notes (Signed)
PT SAT's 86 on 6lpm/[place] . PT awaiting discharge home awaiting OXYGEN. Question safety of this ? Hopefully it will work out.

## 2013-12-18 LAB — PROTIME-INR
INR: 1.52 — ABNORMAL HIGH (ref 0.00–1.49)
Prothrombin Time: 17.9 s — ABNORMAL HIGH (ref 11.6–15.2)

## 2013-12-18 MED ORDER — WARFARIN SODIUM 10 MG PO TABS
10.0000 mg | ORAL_TABLET | Freq: Once | ORAL | Status: AC
  Administered 2013-12-18: 10 mg via ORAL
  Filled 2013-12-18: qty 1

## 2013-12-18 NOTE — Progress Notes (Signed)
12/18/13 1623 Notified Dr. Sherrie MustacheFisher patient portable home O2 to be delivered this evening at 1830 per case management. Stated patient okay for discharge home. Discharge AVS and order still in place. Earnstine RegalAshley Jaylen Knope ,RN

## 2013-12-18 NOTE — Progress Notes (Signed)
ANTICOAGULATION CONSULT NOTE  Pharmacy Consult for Coumadin Indication: atrial fibrillation  No Known Allergies  Patient Measurements: Height: 5\' 8"  (172.7 cm) Weight: 244 lb 11.2 oz (110.995 kg) IBW/kg (Calculated) : 63.9  Vital Signs: Temp: 98 F (36.7 C) (02/09 0430) Temp src: Oral (02/09 0430) BP: 110/66 mmHg (02/09 0430) Pulse Rate: 64 (02/09 0905)  Labs:  Recent Labs  12/16/13 0500 12/17/13 0625 12/18/13 0458  LABPROT 13.8 15.3* 17.9*  INR 1.08 1.24 1.52*   Estimated Creatinine Clearance: 58.9 ml/min (by C-G formula based on Cr of 1.06).  Medical History: Past Medical History  Diagnosis Date  . COPD (chronic obstructive pulmonary disease)   . Hypertension   . Hyperlipidemia   . Gout   . Poor historian   . Recurrent right pleural effusion 12/10/2013  . CAP (community acquired pneumonia) 12/13/2013  . Acute on chronic diastolic heart failure 12/13/2013  . Lower extremity venous stasis 12/13/2013   Medications:  Scheduled:  . allopurinol  100 mg Oral Daily  . antiseptic oral rinse  15 mL Mouth Rinse q12n4p  . atorvastatin  10 mg Oral q1800  . cefUROXime  500 mg Oral BID WC  . furosemide  80 mg Oral Daily  . gabapentin  300 mg Oral QHS  . heparin  5,000 Units Subcutaneous Q8H  . levalbuterol  0.63 mg Nebulization TID  . metoprolol tartrate  25 mg Oral BID  . mometasone-formoterol  2 puff Inhalation BID  . NIFEdipine  30 mg Oral QHS  . predniSONE  20 mg Oral Q breakfast  . sodium chloride  3 mL Intravenous Q12H  . Warfarin - Pharmacist Dosing Inpatient   Does not apply Q24H   Assessment: 79yo female with new onset atrial fibrillation.  Initiating Coumadin anticoagulation for CHADS2Vasc score of 5 (HF, HTN, female, age>75).   INR slowly rising to goal.  No bleeding noted.   Goal of Therapy:  INR 2-3   Plan:  Coumadin 10 mg po x1 now INR daily Continue SQ Heparin until INR >2 or discharge to home Noted plans for Coumadin 7.5 mg daily at discharge.  With  outpatient INR monitoring Patient educated on Coumadin  Elson ClanLilliston, Kyley Solow Michelle 12/18/2013,1:23 PM

## 2013-12-18 NOTE — Plan of Care (Signed)
Problem: Phase I Progression Outcomes Goal: O2 sats > or equal 90% or at baseline Outcome: Not Met (add Reason) Patient to have portable home O2 delivered for discharge and home O2 delivered to her home per [facility]. Arranged per case management. Donavan Foil, RN

## 2013-12-18 NOTE — Progress Notes (Signed)
12/18/13 1932 Late entry for 1845. Arrived to patient's room to notify [facility] representative stated home O2 still on route to be delivered tonight, they will call nursing staff with estimated time. Daughter had arrived, very upset, stated "she is leaving tonight, whether it's going home or to another hospital. They just called me and they cancelled it". Discussed with daughter and patient that representative stated O2 still on way to be delivered. Daughter remained upset requesting patient be transferred to another hospital "if oxygen not here in 30 minutes".  Notified nursing supervisor of daughter concerns. Received call from Dewayne HatchAnn at [facility], stated unsure why home O2 delivery order was cancelled, but she had notified dispatch to have it delivered tonight. Would also pass information along to her supervisors to address.  Stated [employer] representative was on the phone with patient's daughter to notify her. Nursing supervisor and RN notified patient and her daughters of update. Daughter stated "they said they would have someone call me in 15 minutes with a delivery time". Portable home O2 tanks delivered to patient's room at 1900 while nurse and nursing supervisor at bedside. Patient and daughters relieved when O2 was delivered. Patient and daughters verbalize understanding of home O2 use. Home oxygen concentrator to be delivered to patient's home tonight.  Patient and daughters verbalize understanding of discharge instructions. Daughter aware of prescriptions and when home medications next due noted on AVS. Portable O2 in place at time of discharge. Pt left floor in stable condition via w/c accompanied by her daughters and nursing supervisor. Earnstine RegalAshley Yosef Krogh, RN

## 2013-12-18 NOTE — Plan of Care (Signed)
Problem: Phase II Progression Outcomes Goal: O2 sats > equal to 90% on RA or at baseline Outcome: Not Met (add Reason) 12/18/13 1039 Patient to have home O2 delivered prior to discharge. Arranged with [facility] per case management. Donavan Foil, RN

## 2013-12-18 NOTE — Progress Notes (Signed)
The patient apparently was not discharged yesterday as ordered because [employer] was unable to provide portable oxygen to the patient to travel home with. The case manager is aware and is working on obtaining portable oxygen for the patient to be discharged from the company. The patient was briefly seen by me. She is in no acute distress. Her chart, vital signs, and laboratory studies were reviewed. No changes in the discharge summary dictated.

## 2013-12-18 NOTE — Progress Notes (Signed)
12/18/13 1654 Reviewed discharge instructions with patient. Given copy of AVS, f/u appointments scheduled, prescriptions given. Noted when home medications next due on list. Discussed when to call MD, safety with home O2 use. Patient verbalizes understanding of instructions, when to seek medical attention, and importance of no one smoking around her while she is on oxygen. Pt states "i don't smoke and won't let anyone else". No IV access. No c/o pain or discomfort at this time. Pt in stable condition awaiting home O2 delivery and daughter arrival for discharge. Earnstine RegalAshley Melquisedec Journey, RN

## 2013-12-18 NOTE — Progress Notes (Signed)
12/18/13 1841 Home O2 to be delivered this evening at 1830. Home portable O2 not yet delivered, called [employer] representative to follow-up. Representative stated O2 still on schedule to be delivered this evening, will notify delivery to call nursing staff with estimated arrival time. Earnstine RegalAshley Iriel Nason, RN

## 2013-12-18 NOTE — Discharge Instructions (Signed)
Atrial Fibrillation Atrial fibrillation is a condition that causes your heart to beat irregularly. It may also cause your heart to beat faster than normal. Atrial fibrillation can prevent your heart from pumping blood normally. It increases your risk of stroke and heart problems. HOME CARE  Take medications as told by your doctor.  Only take medications that your doctor says are safe. Some medications can make the condition worse or happen again.  If blood thinners were prescribed by your doctor, take them exactly as told. Too much can cause bleeding. Too little and you will not have the needed protection against stroke and other problems.  Perform blood tests at home if told by your doctor.  Perform blood tests exactly as told by your doctor.  Do not drink alcohol.  Do not drink beverages with caffeine such as coffee, soda, and some teas.  Maintain a healthy weight.  Do not use diet pills unless your doctor says they are safe. They may make heart problems worse.  Follow diet instructions as told by your doctor.  Exercise regularly as told by your doctor.  Keep all follow-up appointments. GET HELP RIGHT AWAY IF:   You have chest or belly (abdominal) pain.  You feel sick to your stomach (nauseous)  You suddenly have swollen feet and ankles.  You feel dizzy.  You face, arms, or legs feel numb or weak.  There is a change in your vision or speech.  You notice a change in the speed, rhythm, or strength of your heartbeat.  You suddenly begin peeing (urinating) more often.  You get tired more easily when moving or exercising. MAKE SURE YOU:   Understand these instructions.  Will watch your condition.  Will get help right away if you are not doing well or get worse. Document Released: 08/04/2008 Document Revised: 02/20/2013 Document Reviewed: 12/06/2012 [facility] Patient Information 2014 [place] [place].  Respiratory Failure Respiratory failure is when the breathing  (respiratory) system fails. This means your lungs are not working well. This happens when the lungs cannot take in enough oxygen or get rid of enough carbon dioxide. Acute respiratory failure may be life-threatening. If blood gasses are done, respiratory failure is present when the PaO2 value is less than 60 mm Hg while breathing air or the PaCO2 is more than 50 mm Hg. CAUSES  Any problem affecting the heart or lungs can be the cause of this. A few of the common causes are:  Chronic bronchitis and emphysema (COPD).  Blood clot going to lung.  Pulmonary edema (water in the lungs).  Collapse of a lung.  Bronchiectasis.  Any problem from muscles or bones that makes breathing difficult or nearly impossible.  Pneumonia.  Obesity.  Asthma.  Pulmonary embolism.  Adult respiratory distress syndrome.  Heart attack where heart cannot pump well enough. SYMPTOMS  Respiratory failure commonly shows up with the following problems:  Difficulty breathing (dyspnea).  Rapid breathing (tachypnea).  Shortness of breath with even mild activity.  Bluish color of the skin (cyanosis), the skin beneath your nails, and mucous membranes (like the lining of the mouth).  Confusion and drowsiness or sleepiness. DIAGNOSIS  This problem is often discovered by your caregiver while taking a careful history and performing a physical examination. The diagnosis of respiratory failure is then proven by doing arterial blood gas analysis. In addition to other blood work, further x-rays and specialized testing may be needed. The cause of the failure must be found early for best results and survival. TREATMENT  In severe cases, the problem may cause you to need a ventilator. A ventilator is a machine that artificially breathes for you. You will determine which treatment is best for you or your loved one by discussing this with your caregiver. Some of the treatments used include:  Oxygen (in hospital setting or for  home use).  Endotracheal tube and ventilator is used for a severe crisis. This is used to increase the amount of oxygen delivered, or to give the muscles needed for breathing a rest. Most ventilation is positive pressure ventilation. This means the breathing machine blows the lungs up much like you would blow up a balloon. Document Released: 10/26/2005 Document Revised: 01/18/2012 Document Reviewed: 03/09/2007 [facility] Patient Information 2014 [place], [place].

## 2013-12-19 NOTE — Progress Notes (Signed)
UR chart review completed.  

## 2013-12-21 ENCOUNTER — Ambulatory Visit (INDEPENDENT_AMBULATORY_CARE_PROVIDER_SITE_OTHER): Payer: Medicare FFS | Admitting: *Deleted

## 2013-12-21 DIAGNOSIS — I4891 Unspecified atrial fibrillation: Secondary | ICD-10-CM

## 2013-12-21 DIAGNOSIS — Z5181 Encounter for therapeutic drug level monitoring: Secondary | ICD-10-CM

## 2013-12-21 LAB — POCT INR: INR: 3

## 2013-12-28 ENCOUNTER — Ambulatory Visit (INDEPENDENT_AMBULATORY_CARE_PROVIDER_SITE_OTHER): Payer: Medicare FFS | Admitting: *Deleted

## 2013-12-28 DIAGNOSIS — I4891 Unspecified atrial fibrillation: Secondary | ICD-10-CM

## 2013-12-28 DIAGNOSIS — Z5181 Encounter for therapeutic drug level monitoring: Secondary | ICD-10-CM

## 2013-12-28 LAB — POCT INR
INR: 3
INR: 3

## 2014-01-11 ENCOUNTER — Ambulatory Visit (INDEPENDENT_AMBULATORY_CARE_PROVIDER_SITE_OTHER): Payer: Medicare FFS | Admitting: *Deleted

## 2014-01-11 DIAGNOSIS — I4891 Unspecified atrial fibrillation: Secondary | ICD-10-CM

## 2014-01-11 DIAGNOSIS — Z5181 Encounter for therapeutic drug level monitoring: Secondary | ICD-10-CM

## 2014-01-11 LAB — POCT INR: INR: 6

## 2014-01-15 ENCOUNTER — Ambulatory Visit (INDEPENDENT_AMBULATORY_CARE_PROVIDER_SITE_OTHER): Payer: Medicare FFS | Admitting: *Deleted

## 2014-01-15 DIAGNOSIS — Z5181 Encounter for therapeutic drug level monitoring: Secondary | ICD-10-CM

## 2014-01-15 DIAGNOSIS — I4891 Unspecified atrial fibrillation: Secondary | ICD-10-CM

## 2014-01-15 LAB — POCT INR: INR: 1.4

## 2014-01-17 ENCOUNTER — Ambulatory Visit: Payer: Medicare FFS | Admitting: Cardiology

## 2014-01-19 ENCOUNTER — Ambulatory Visit (INDEPENDENT_AMBULATORY_CARE_PROVIDER_SITE_OTHER): Payer: Medicare FFS | Admitting: *Deleted

## 2014-01-19 DIAGNOSIS — Z5181 Encounter for therapeutic drug level monitoring: Secondary | ICD-10-CM

## 2014-01-19 DIAGNOSIS — I4891 Unspecified atrial fibrillation: Secondary | ICD-10-CM

## 2014-01-19 LAB — POCT INR: INR: 2.5

## 2014-01-26 ENCOUNTER — Ambulatory Visit (INDEPENDENT_AMBULATORY_CARE_PROVIDER_SITE_OTHER): Payer: Medicare FFS | Admitting: *Deleted

## 2014-01-26 DIAGNOSIS — I4891 Unspecified atrial fibrillation: Secondary | ICD-10-CM

## 2014-01-26 DIAGNOSIS — Z5181 Encounter for therapeutic drug level monitoring: Secondary | ICD-10-CM

## 2014-01-26 LAB — POCT INR: INR: 4.2

## 2014-02-01 ENCOUNTER — Ambulatory Visit (INDEPENDENT_AMBULATORY_CARE_PROVIDER_SITE_OTHER): Payer: Medicare FFS | Admitting: *Deleted

## 2014-02-01 DIAGNOSIS — Z5181 Encounter for therapeutic drug level monitoring: Secondary | ICD-10-CM

## 2014-02-01 DIAGNOSIS — I4891 Unspecified atrial fibrillation: Secondary | ICD-10-CM

## 2014-02-01 LAB — POCT INR: INR: 3

## 2014-02-02 ENCOUNTER — Telehealth: Payer: Self-pay

## 2014-02-02 ENCOUNTER — Encounter: Payer: Self-pay | Admitting: Cardiology

## 2014-02-02 ENCOUNTER — Ambulatory Visit (INDEPENDENT_AMBULATORY_CARE_PROVIDER_SITE_OTHER): Payer: Medicare FFS | Admitting: Cardiology

## 2014-02-02 VITALS — BP 145/90 | HR 85 | Ht 65.0 in | Wt 254.0 lb

## 2014-02-02 DIAGNOSIS — R911 Solitary pulmonary nodule: Secondary | ICD-10-CM

## 2014-02-02 DIAGNOSIS — I5032 Chronic diastolic (congestive) heart failure: Secondary | ICD-10-CM

## 2014-02-02 DIAGNOSIS — I4891 Unspecified atrial fibrillation: Secondary | ICD-10-CM

## 2014-02-02 DIAGNOSIS — E782 Mixed hyperlipidemia: Secondary | ICD-10-CM | POA: Insufficient documentation

## 2014-02-02 DIAGNOSIS — I050 Rheumatic mitral stenosis: Secondary | ICD-10-CM | POA: Insufficient documentation

## 2014-02-02 DIAGNOSIS — J449 Chronic obstructive pulmonary disease, unspecified: Secondary | ICD-10-CM

## 2014-02-02 NOTE — Assessment & Plan Note (Signed)
Details not clear. She was noted to have hypoxic respiratory failure during hospitalization, probably multifactorial. Seemed to be worse at night based on records, possible component of hypoventilation syndrome particularly with her obesity. Does not appear that she has been referred to a pulmonary specialist, continues on supple no oxygen. I have asked nursing to check with [facility] to see if perhaps she can be weaned somewhat. We are also referring her to Dr. Juanetta GoslingHawkins for pulmonary evaluation and assistance with her management.

## 2014-02-02 NOTE — Progress Notes (Signed)
Clinical Summary Ms. Stopper is a 79 y.o.female referred to the office by Dr. Clovis Pu, currently enrolled in our Coumadin clinic with a diagnosis of atrial fibrillation. I reviewed her records, she was hospitalized in February with community-acquired pneumonia associated with pleural effusions and diastolic heart failure, also new diagnosis of atrial fibrillation. She was managed on the hospitalist team. Coumadin was initiated based on discussion had per Dr. Sherrie Mustache and Dr. Purvis Sheffield as indicated in the discharge summary. CHADSVASC score is 5.  No bleeding problems on Coumadin. Recent INR 3.0. She has had home health checking PT/INR, results forwarded to our clinic.  Echocardiogram from February of this year showed moderate LVH with LVEF 60-65%, grade 1 diastolic dysfunction, borderline aortic stenosis, mild to moderate mitral stenosis, trivial mitral regurgitation, moderate to severe left atrial enlargement, mild right atrial enlargement, unable to assess PASP.  She continues to wear oxygen at 6 L nasal cannula. We did not order her oxygen, this was accomplished by the hospitalist team. I do not know who is specifically following this at the present time, or what the plans are for weaning it.  She is functionally limited, is able to walk with physical therapy, but her legs have been chronically bandaged, followed in the wound clinic at [facility].   No Known Allergies  Current Outpatient Prescriptions  Medication Sig Dispense Refill  . albuterol (PROVENTIL HFA;VENTOLIN HFA) 108 (90 BASE) MCG/ACT inhaler Inhale 2 puffs into the lungs 2 (two) times daily.  1 Inhaler  2  . allopurinol (ZYLOPRIM) 300 MG tablet Take 300 mg by mouth daily.      . Fluticasone-Salmeterol (ADVAIR) 250-50 MCG/DOSE AEPB Inhale 1 puff into the lungs 2 (two) times daily.      . furosemide (LASIX) 40 MG tablet TAKE 2 TABLETS IN THE MORNING AND 1 TABLET IN THE AFTERNOON.  90 tablet  2  . gabapentin (NEURONTIN) 300 MG  capsule Take 300 mg by mouth at bedtime.      Marland Kitchen HYDROcodone-acetaminophen (NORCO/VICODIN) 5-325 MG per tablet Take 0.5 tablets by mouth daily as needed for moderate pain.      Marland Kitchen losartan (COZAAR) 100 MG tablet Take 0.5 tablets (50 mg total) by mouth daily.      . metoprolol (LOPRESSOR) 50 MG tablet Take 1 tablet (50 mg total) by mouth 2 (two) times daily. Take 1 tablet in the morning and 1 tablet in the evening.      Marland Kitchen NIFEdipine (PROCARDIA-XL/ADALAT-CC/NIFEDICAL-XL) 30 MG 24 hr tablet Take 30 mg by mouth daily.      . predniSONE (DELTASONE) 20 MG tablet Take 1 tablet (20 mg total) by mouth daily as needed (For gout pain).  5 tablet  0  . rosuvastatin (CRESTOR) 40 MG tablet Take 40 mg by mouth daily.      . Simethicone (GAS RELIEF PO) Take 1 tablet by mouth daily.      Marland Kitchen warfarin (COUMADIN) 5 MG tablet Take 1.5 tablets (7.5 mg total) by mouth daily. Take 1 and a 1/2 tablets each evening. Further dosing will be given to you by the Coumadin clinic.  45 tablet  2   No current facility-administered medications for this visit.    Past Medical History  Diagnosis Date  . COPD (chronic obstructive pulmonary disease)   . Essential hypertension, benign   . Hyperlipidemia   . Gout   . Pleural effusion, bilateral     Noted February 2015 in the setting of pneumonia and diastolic heart failure  .  CAP (community acquired pneumonia)   . Chronic diastolic heart failure     LVEF 60-65%, grade 1 diastolic dysfunction  . Atrial fibrillation     Diagnosed February 2015  . Lung nodule     7 mm right lower lobe pulmonary nodule - chest CT February 2015  . Mitral stenosis     Mild to moderate    Past Surgical History  Procedure Laterality Date  . Cholecystectomy    . Appendectomy    . Abdominal hysterectomy      Family History  Problem Relation Age of Onset  . Heart failure Mother     Social History Ms. Katrinka BlazingSmith reports that she has quit smoking. Her smoking use included Cigarettes. She smoked 0.00  packs per day. She has quit using smokeless tobacco. Ms. Katrinka BlazingSmith reports that she does not drink alcohol.  Review of Systems No fevers or chills. Still gets short of breath with activity. No falls. Otherwise as outlined.  Physical Examination Filed Vitals:   02/02/14 1401  BP: 145/90  Pulse: 85   Filed Weights   02/02/14 1329  Weight: 254 lb (115.214 kg)   Chronically ill-appearing obese woman, seated in a wheelchair wearing oxygen. No distress. HEENT: Conjunctiva and lids normal, oropharynx clear. Neck: Supple, no carotid bruits, no thyromegaly. Lungs: Decreased breath sounds at the bases, nonlabored breathing at rest. Cardiac: Irregularly irregular, no S3, no obvious-to murmur, no pericardial rub. Abdomen: Soft, nontender, bowel sounds present, no guarding or rebound. Extremities: Both legs are wrapped and bandaged. Skin: Warm and dry. Musculoskeletal: No kyphosis. Neuropsychiatric: Alert and oriented x3, affect grossly appropriate.   Problem List and Plan   Atrial fibrillation Diagnosed in February of this year the setting of community-acquired pneumonia and pleural effusions. CHADSVASC score is 5. She is on Coumadin, followed through our Coumadin clinic. Plan to continue strategy of heart rate control and anticoagulation as tolerated.  Mitral stenosis Mild to moderate by recent echocardiogram.  COPD (chronic obstructive pulmonary disease) Details not clear. She was noted to have hypoxic respiratory failure during hospitalization, probably multifactorial. Seemed to be worse at night based on records, possible component of hypoventilation syndrome particularly with her obesity. Does not appear that she has been referred to a pulmonary specialist, continues on supple no oxygen. I have asked nursing to check with [facility] to see if perhaps she can be weaned somewhat. We are also referring her to Dr. Juanetta GoslingHawkins for pulmonary evaluation and assistance with her  management.  Chronic diastolic heart failure Recent echocardiogram done recently showed LVEF 60-65% with grade 1 diastolic dysfunction.  Pulmonary nodule 7 cm right lower lobe pulmonary nodule by chest CT in February 2015. This will need followup by her primary care provider or pulmonologist in the next 6 months.    Jonelle SidleSamuel G. McDowell, M.D., F.A.C.C.

## 2014-02-02 NOTE — Patient Instructions (Addendum)
Your physician recommends that you schedule a follow-up appointment in: 6 weeks   Your physician recommends that you continue on your current medications as directed. Please refer to the Current Medication list given to you today.    We have placed a referral for you to see Dr.Hawkins for pulmonary consultation

## 2014-02-02 NOTE — Assessment & Plan Note (Signed)
7 cm right lower lobe pulmonary nodule by chest CT in February 2015. This will need followup by her primary care provider or pulmonologist in the next 6 months. 

## 2014-02-02 NOTE — Assessment & Plan Note (Signed)
Recent echocardiogram done recently showed LVEF 60-65% with grade 1 diastolic dysfunction.

## 2014-02-02 NOTE — Assessment & Plan Note (Signed)
Diagnosed in February of this year the setting of community-acquired pneumonia and pleural effusions. CHADSVASC score is 5. She is on Coumadin, followed through our Coumadin clinic. Plan to continue strategy of heart rate control and anticoagulation as tolerated. 

## 2014-02-02 NOTE — Assessment & Plan Note (Signed)
Mild to moderate by recent echocardiogram. 

## 2014-02-02 NOTE — Telephone Encounter (Signed)
Victorino DikeJennifer ,case manager at [facility] [phone] informed me that nurse Estevan Oaksaroline Lee RN does home visits for pt to check bp's and SaO2 checks.She faxed results to me and I placed on Dr.McDowells desk for  review

## 2014-02-05 ENCOUNTER — Encounter: Payer: Self-pay | Admitting: Cardiology

## 2014-02-08 ENCOUNTER — Telehealth: Payer: Self-pay | Admitting: *Deleted

## 2014-02-08 ENCOUNTER — Ambulatory Visit (INDEPENDENT_AMBULATORY_CARE_PROVIDER_SITE_OTHER): Payer: Medicare FFS | Admitting: *Deleted

## 2014-02-08 DIAGNOSIS — I4891 Unspecified atrial fibrillation: Secondary | ICD-10-CM

## 2014-02-08 DIAGNOSIS — Z5181 Encounter for therapeutic drug level monitoring: Secondary | ICD-10-CM

## 2014-02-08 LAB — POCT INR: INR: 2.3

## 2014-02-08 NOTE — Telephone Encounter (Signed)
See coumadin note. 

## 2014-02-08 NOTE — Telephone Encounter (Signed)
INR 2.3 PT 27.8, NO MISSED DOSES

## 2014-02-14 ENCOUNTER — Encounter: Payer: Self-pay | Admitting: Cardiology

## 2014-02-15 ENCOUNTER — Telehealth: Payer: Self-pay | Admitting: *Deleted

## 2014-02-15 ENCOUNTER — Ambulatory Visit (INDEPENDENT_AMBULATORY_CARE_PROVIDER_SITE_OTHER): Payer: Medicare FFS | Admitting: *Deleted

## 2014-02-15 DIAGNOSIS — Z5181 Encounter for therapeutic drug level monitoring: Secondary | ICD-10-CM

## 2014-02-15 DIAGNOSIS — I4891 Unspecified atrial fibrillation: Secondary | ICD-10-CM

## 2014-02-15 LAB — POCT INR: INR: 2.9

## 2014-02-15 NOTE — Telephone Encounter (Signed)
See coumadin note. 

## 2014-02-15 NOTE — Telephone Encounter (Signed)
INR 2.9 PT 34.4. NO MISSED DOSES SHE HAS BEEN TAKING 5MG  EVERYDAY AND 1 1/2 TAB ON THURSDAY

## 2014-02-22 ENCOUNTER — Ambulatory Visit (INDEPENDENT_AMBULATORY_CARE_PROVIDER_SITE_OTHER): Payer: Medicare FFS | Admitting: *Deleted

## 2014-02-22 DIAGNOSIS — I4891 Unspecified atrial fibrillation: Secondary | ICD-10-CM

## 2014-02-22 DIAGNOSIS — Z5181 Encounter for therapeutic drug level monitoring: Secondary | ICD-10-CM

## 2014-02-22 LAB — POCT INR: INR: 3.1

## 2014-03-01 ENCOUNTER — Ambulatory Visit (INDEPENDENT_AMBULATORY_CARE_PROVIDER_SITE_OTHER): Payer: Medicare FFS | Admitting: *Deleted

## 2014-03-01 DIAGNOSIS — I4891 Unspecified atrial fibrillation: Secondary | ICD-10-CM

## 2014-03-01 DIAGNOSIS — Z5181 Encounter for therapeutic drug level monitoring: Secondary | ICD-10-CM

## 2014-03-01 LAB — POCT INR: INR: 3.9

## 2014-03-08 ENCOUNTER — Ambulatory Visit (INDEPENDENT_AMBULATORY_CARE_PROVIDER_SITE_OTHER): Payer: Medicare FFS | Admitting: Pharmacist

## 2014-03-08 ENCOUNTER — Telehealth: Payer: Self-pay | Admitting: *Deleted

## 2014-03-08 DIAGNOSIS — Z5181 Encounter for therapeutic drug level monitoring: Secondary | ICD-10-CM

## 2014-03-08 DIAGNOSIS — I4891 Unspecified atrial fibrillation: Secondary | ICD-10-CM

## 2014-03-08 LAB — POCT INR: INR: 2.3

## 2014-03-08 NOTE — Telephone Encounter (Signed)
INR 2.3 PT 28.06. NO MIISSED DOSES.

## 2014-03-08 NOTE — Telephone Encounter (Signed)
Patient notified to continue warfarin 5 mg qd except 2.5 mg Tuesday and Friday.  [facility] called back to inform me that they are discharging patient today.  She needs to recheck INR in 1 week in the office.  I have made multiple attempts to call patient back, however she will not pick up.  Please call patient and have her scheduled to be seen in clinic for PT/INR.

## 2014-03-08 NOTE — Telephone Encounter (Signed)
Left message for patient to call back  

## 2014-03-08 NOTE — Telephone Encounter (Signed)
Patient tells me she can't come next week as no one will be able to bring her.  She would like to come in for protime in 2 weeks since she already has an appointment with Dr. Diona BrownerMcDowell on 03/22/14. She understands her risk by waiting the extra week before coming back, but tells me this would be the soonest she could come.

## 2014-03-15 ENCOUNTER — Ambulatory Visit (INDEPENDENT_AMBULATORY_CARE_PROVIDER_SITE_OTHER): Payer: Medicare FFS | Admitting: *Deleted

## 2014-03-15 DIAGNOSIS — Z5181 Encounter for therapeutic drug level monitoring: Secondary | ICD-10-CM

## 2014-03-15 DIAGNOSIS — I4891 Unspecified atrial fibrillation: Secondary | ICD-10-CM

## 2014-03-15 LAB — POCT INR: INR: 2.9

## 2014-03-22 ENCOUNTER — Ambulatory Visit (INDEPENDENT_AMBULATORY_CARE_PROVIDER_SITE_OTHER): Payer: Medicare FFS | Admitting: Cardiology

## 2014-03-22 ENCOUNTER — Encounter: Payer: Self-pay | Admitting: Cardiology

## 2014-03-22 ENCOUNTER — Ambulatory Visit (INDEPENDENT_AMBULATORY_CARE_PROVIDER_SITE_OTHER): Payer: Medicare FFS | Admitting: *Deleted

## 2014-03-22 VITALS — BP 106/42 | HR 55 | Ht 66.0 in | Wt 261.0 lb

## 2014-03-22 DIAGNOSIS — R911 Solitary pulmonary nodule: Secondary | ICD-10-CM

## 2014-03-22 DIAGNOSIS — I4891 Unspecified atrial fibrillation: Secondary | ICD-10-CM

## 2014-03-22 DIAGNOSIS — I5032 Chronic diastolic (congestive) heart failure: Secondary | ICD-10-CM

## 2014-03-22 DIAGNOSIS — Z5181 Encounter for therapeutic drug level monitoring: Secondary | ICD-10-CM

## 2014-03-22 DIAGNOSIS — I10 Essential (primary) hypertension: Secondary | ICD-10-CM

## 2014-03-22 LAB — POCT INR: INR: 2.2

## 2014-03-22 NOTE — Assessment & Plan Note (Signed)
7 cm right lower lobe pulmonary nodule by chest CT in February 2015. This will need followup by her primary care provider or pulmonologist in the next 6 months.

## 2014-03-22 NOTE — Patient Instructions (Signed)
Your physician recommends that you schedule a follow-up appointment in:  3 months    Your physician recommends that you continue on your current medications as directed. Please refer to the Current Medication list given to you today.     Thank you for choosing [facility] !   

## 2014-03-22 NOTE — Assessment & Plan Note (Signed)
Recent echocardiogram done recently showed LVEF 60-65% with grade 1 diastolic dysfunction. Also has right-sided symptoms with peripheral edema in the setting of mild to moderate mitral stenosis and chronic lung disease.

## 2014-03-22 NOTE — Assessment & Plan Note (Signed)
Diagnosed in February of this year the setting of community-acquired pneumonia and pleural effusions. CHADSVASC score is 5. She is on Coumadin, followed through our Coumadin clinic. Plan to continue strategy of heart rate control and anticoagulation as tolerated.

## 2014-03-22 NOTE — Progress Notes (Signed)
Clinical Summary [name] is a 79 y.o.female seen for the first time back in March of this year. She reports no palpitations or chest pain. Lasix has been advanced since I last saw her, weight is actually up however by our scales. [facility] with weeping ulcers and leg wraps.  She will be seeing Dr. Juanetta Fletcher later this month to establish pulmonary followup as detailed in my .prior note. She still is on oxygen at home.  Most recent INR was 2.9 on May 7. No reported bleeding episodes.   Echocardiogram from February of this year showed moderate LVH with LVEF 60-65%, grade 1 diastolic dysfunction, borderline aortic stenosis, mild to moderate mitral stenosis, trivial mitral regurgitation, moderate to severe left atrial enlargement, mild right atrial enlargement, unable to assess PASP.   No Known Allergies  Current Outpatient Prescriptions  Medication Sig Dispense Refill  . albuterol (PROVENTIL HFA;VENTOLIN HFA) 108 (90 BASE) MCG/ACT inhaler Inhale 2 puffs into the lungs 2 (two) times daily.  1 Inhaler  2  . allopurinol (ZYLOPRIM) 300 MG tablet Take 300 mg by mouth daily.      . Fluticasone-Salmeterol (ADVAIR) 250-50 MCG/DOSE AEPB Inhale 1 puff into the lungs 2 (two) times daily.      . furosemide (LASIX) 40 MG tablet Take 2 tabs 40 mg each in am, 2 tabs 40 mg each at noon and 1 tablet 40 mg at 5 pm      . gabapentin (NEURONTIN) 300 MG capsule Take 300 mg by mouth at bedtime.      Marland Kitchen. losartan (COZAAR) 100 MG tablet Take 0.5 tablets (50 mg total) by mouth daily.      . metoprolol (LOPRESSOR) 50 MG tablet Take 1 tablet (50 mg total) by mouth 2 (two) times daily. Take 1 tablet in the morning and 1 tablet in the evening.      Marland Kitchen. NIFEdipine (PROCARDIA-XL/ADALAT-CC/NIFEDICAL-XL) 30 MG 24 hr tablet Take 30 mg by mouth daily.      . predniSONE (DELTASONE) 20 MG tablet Take 1 tablet (20 mg total) by mouth daily as needed (For gout pain).  5  tablet  0  . rosuvastatin (CRESTOR) 40 MG tablet Take 40 mg by mouth daily.      . Simethicone (GAS RELIEF PO) Take 1 tablet by mouth daily.      Marland Kitchen. warfarin (COUMADIN) 5 MG tablet Take 1.5 tablets (7.5 mg total) by mouth daily. Take 1 and a 1/2 tablets each evening. Further dosing will be given to you by the Coumadin clinic.  45 tablet  2  . HYDROcodone-acetaminophen (NORCO/VICODIN) 5-325 MG per tablet Take 0.5 tablets by mouth daily as needed for moderate pain.       No current facility-administered medications for this visit.    Past Medical History  Diagnosis Date  . COPD (chronic obstructive pulmonary disease)   . Essential hypertension, benign   . Hyperlipidemia   . Gout   . Pleural effusion, bilateral     Noted February 2015 in the setting of pneumonia and diastolic heart failure  . CAP (community acquired pneumonia)   . Chronic diastolic heart failure     LVEF 60-65%, grade 1 diastolic dysfunction  . Atrial fibrillation     Diagnosed February 2015  . Lung nodule     7 mm right lower lobe pulmonary nodule - chest CT February 2015  . Mitral stenosis     Mild to moderate  Social History [name] reports that she has quit smoking. Her smoking use included Cigarettes. She smoked 0.00 packs per day. She has quit using smokeless tobacco. [name] reports that she does not drink alcohol.  Review of Systems  Tension headaches. No visual changes. Otherwise as outlined.  Physical Examination Filed Vitals:   03/22/14 0837  BP: 106/42  Pulse: 55   Filed Weights   03/22/14 0837  Weight: 261 lb (118.389 kg)    Chronically ill-appearing obese woman, seated in a wheelchair wearing oxygen. No distress.  HEENT: Conjunctiva and lids normal, oropharynx clear.  Neck: Supple, no carotid bruits, no thyromegaly.  Lungs: Decreased breath sounds at the bases, nonlabored breathing at rest.  Cardiac: Irregularly irregular, no S3, no obvious-to murmur, no pericardial rub.  Abdomen:  Soft, nontender, bowel sounds present, no guarding or rebound.  Extremities: Both legs are wrapped and bandaged.  Skin: Warm and dry.  Musculoskeletal: No kyphosis.  Neuropsychiatric: Alert and oriented x3, affect grossly appropriate.   Problem List and Plan   Atrial fibrillation Diagnosed in February of this year the setting of community-acquired pneumonia and pleural effusions. CHADSVASC score is 5. She is on Coumadin, followed through our Coumadin clinic. Plan to continue strategy of heart rate control and anticoagulation as tolerated.  Chronic diastolic heart failure Recent echocardiogram done recently showed LVEF 60-65% with grade 1 diastolic dysfunction. Also has right-sided symptoms with peripheral edema in the setting of mild to moderate mitral stenosis and chronic lung disease.  Essential hypertension Blood pressure well controlled today.  Pulmonary nodule 7 cm right lower lobe pulmonary nodule by chest CT in February 2015. This will need followup by her primary care provider or pulmonologist in the next 6 months.    Jonelle SidleSamuel G. Evia Goldsmith, M.D., F.A.C.C.

## 2014-03-22 NOTE — Assessment & Plan Note (Signed)
Blood pressure well-controlled today. 

## 2014-03-30 ENCOUNTER — Other Ambulatory Visit (HOSPITAL_COMMUNITY): Payer: Self-pay

## 2014-03-30 DIAGNOSIS — R0602 Shortness of breath: Secondary | ICD-10-CM

## 2014-04-11 ENCOUNTER — Ambulatory Visit (HOSPITAL_COMMUNITY)
Admission: RE | Admit: 2014-04-11 | Discharge: 2014-04-11 | Disposition: A | Discharge status: Home / Self Care | Payer: Medicare FFS | Source: Ambulatory Visit | Attending: Pulmonary Disease | Admitting: Pulmonary Disease

## 2014-04-11 DIAGNOSIS — R0602 Shortness of breath: Secondary | ICD-10-CM | POA: Insufficient documentation

## 2014-04-11 MED ORDER — ALBUTEROL SULFATE (2.5 MG/3ML) 0.083% IN NEBU
2.5000 mg | INHALATION_SOLUTION | Freq: Once | RESPIRATORY_TRACT | Status: AC
  Administered 2014-04-11: 2.5 mg via RESPIRATORY_TRACT

## 2014-04-12 ENCOUNTER — Ambulatory Visit (INDEPENDENT_AMBULATORY_CARE_PROVIDER_SITE_OTHER): Payer: Medicare FFS | Admitting: Cardiovascular Disease

## 2014-04-12 ENCOUNTER — Telehealth: Payer: Self-pay | Admitting: *Deleted

## 2014-04-12 DIAGNOSIS — Z5181 Encounter for therapeutic drug level monitoring: Secondary | ICD-10-CM

## 2014-04-12 DIAGNOSIS — I4891 Unspecified atrial fibrillation: Secondary | ICD-10-CM

## 2014-04-12 LAB — POCT INR: INR: 2.1

## 2014-04-12 NOTE — Telephone Encounter (Signed)
Result noted see anticoagulation note in Epic.  

## 2014-04-12 NOTE — Telephone Encounter (Signed)
[facility] from [facility] is calling [phone]  INR 2.1 and PT 25.7  Please call patient and home health back so they know when they need to recheck

## 2014-04-23 LAB — PULMONARY FUNCTION TEST
DL/VA % pred: 42 %
DL/VA: 2.16 ml/min/mmHg/L
DLCO COR % PRED: 23 %
DLCO COR: 6.43 ml/min/mmHg
DLCO UNC % PRED: 23 %
DLCO unc: 6.43 ml/min/mmHg
FEF 25-75 PRE: 0.75 L/s
FEF 25-75 Post: 0.7 L/sec
FEF2575-%CHANGE-POST: -7 %
FEF2575-%PRED-POST: 42 %
FEF2575-%PRED-PRE: 45 %
FEV1-%Change-Post: 0 %
FEV1-%Pred-Post: 52 %
FEV1-%Pred-Pre: 53 %
FEV1-Post: 1.18 L
FEV1-Pre: 1.18 L
FEV1FVC-%Change-Post: -4 %
FEV1FVC-%PRED-PRE: 89 %
FEV6-%CHANGE-POST: 1 %
FEV6-%PRED-POST: 64 %
FEV6-%Pred-Pre: 63 %
FEV6-POST: 1.8 L
FEV6-Pre: 1.78 L
FEV6FVC-%CHANGE-POST: -1 %
FEV6FVC-%PRED-PRE: 105 %
FEV6FVC-%Pred-Post: 104 %
FVC-%Change-Post: 4 %
FVC-%PRED-POST: 62 %
FVC-%PRED-PRE: 59 %
FVC-POST: 1.85 L
FVC-Pre: 1.78 L
Post FEV1/FVC ratio: 64 %
Post FEV6/FVC ratio: 99 %
Pre FEV1/FVC ratio: 67 %
Pre FEV6/FVC Ratio: 100 %
RV % pred: 121 %
RV: 2.94 L
TLC % PRED: 92 %
TLC: 4.98 L

## 2014-05-14 ENCOUNTER — Ambulatory Visit (INDEPENDENT_AMBULATORY_CARE_PROVIDER_SITE_OTHER): Payer: Medicare FFS | Admitting: *Deleted

## 2014-05-14 DIAGNOSIS — I4891 Unspecified atrial fibrillation: Secondary | ICD-10-CM

## 2014-05-14 DIAGNOSIS — Z5181 Encounter for therapeutic drug level monitoring: Secondary | ICD-10-CM

## 2014-05-14 LAB — POCT INR: INR: 1.9

## 2014-05-28 ENCOUNTER — Telehealth: Payer: Self-pay | Admitting: *Deleted

## 2014-05-28 ENCOUNTER — Ambulatory Visit (INDEPENDENT_AMBULATORY_CARE_PROVIDER_SITE_OTHER): Payer: Medicare FFS | Admitting: *Deleted

## 2014-05-28 DIAGNOSIS — I4891 Unspecified atrial fibrillation: Secondary | ICD-10-CM

## 2014-05-28 DIAGNOSIS — Z5181 Encounter for therapeutic drug level monitoring: Secondary | ICD-10-CM

## 2014-05-28 LAB — POCT INR: INR: 2.5

## 2014-05-28 NOTE — Telephone Encounter (Signed)
Paula MessierKathy with [facility] calling INR 2.5 PT 29.6

## 2014-06-15 ENCOUNTER — Ambulatory Visit (INDEPENDENT_AMBULATORY_CARE_PROVIDER_SITE_OTHER): Payer: Medicare FFS | Admitting: *Deleted

## 2014-06-15 DIAGNOSIS — Z5181 Encounter for therapeutic drug level monitoring: Secondary | ICD-10-CM

## 2014-06-15 DIAGNOSIS — I4891 Unspecified atrial fibrillation: Secondary | ICD-10-CM

## 2014-06-15 LAB — POCT INR: INR: 1.8

## 2014-06-26 ENCOUNTER — Telehealth: Payer: Self-pay | Admitting: *Deleted

## 2014-06-26 ENCOUNTER — Ambulatory Visit (INDEPENDENT_AMBULATORY_CARE_PROVIDER_SITE_OTHER): Payer: Medicare FFS | Admitting: *Deleted

## 2014-06-26 DIAGNOSIS — I4891 Unspecified atrial fibrillation: Secondary | ICD-10-CM

## 2014-06-26 DIAGNOSIS — Z5181 Encounter for therapeutic drug level monitoring: Secondary | ICD-10-CM

## 2014-06-26 LAB — POCT INR: INR: 2.1

## 2014-06-26 NOTE — Telephone Encounter (Signed)
Would like to know if they can check 9/1 instead of 9/8 since they are discharging her 9/1

## 2014-06-28 ENCOUNTER — Ambulatory Visit: Payer: Medicare FFS | Admitting: Cardiology

## 2014-07-03 ENCOUNTER — Ambulatory Visit: Payer: Medicare FFS | Admitting: Cardiology

## 2014-07-09 ENCOUNTER — Encounter: Payer: Self-pay | Admitting: *Deleted

## 2014-07-10 ENCOUNTER — Ambulatory Visit (INDEPENDENT_AMBULATORY_CARE_PROVIDER_SITE_OTHER): Payer: Medicare FFS | Admitting: Cardiology

## 2014-07-10 DIAGNOSIS — Z5181 Encounter for therapeutic drug level monitoring: Secondary | ICD-10-CM

## 2014-07-10 LAB — POCT INR: INR: 2.9

## 2014-07-25 ENCOUNTER — Ambulatory Visit: Payer: Self-pay | Admitting: *Deleted

## 2014-07-25 ENCOUNTER — Telehealth: Payer: Self-pay | Admitting: *Deleted

## 2014-07-25 DIAGNOSIS — I4891 Unspecified atrial fibrillation: Secondary | ICD-10-CM

## 2014-07-25 DIAGNOSIS — Z5181 Encounter for therapeutic drug level monitoring: Secondary | ICD-10-CM

## 2014-07-25 NOTE — Telephone Encounter (Signed)
PT canceled appt 07/30/14 with coumadin clinic she is switching over to [place] office with Dr Iran Ouch for coumadin therapy

## 2014-11-07 ENCOUNTER — Encounter: Payer: Self-pay | Admitting: *Deleted

## 2014-11-13 ENCOUNTER — Emergency Department (HOSPITAL_COMMUNITY): Payer: Medicare FFS

## 2014-11-13 ENCOUNTER — Encounter (HOSPITAL_COMMUNITY): Payer: Self-pay

## 2014-11-13 ENCOUNTER — Inpatient Hospital Stay (HOSPITAL_COMMUNITY)
Admission: EM | Admit: 2014-11-13 | Discharge: 2014-12-10 | DRG: 207 | Disposition: E | Payer: Medicare FFS | Attending: Pulmonary Disease | Admitting: Pulmonary Disease

## 2014-11-13 DIAGNOSIS — R680 Hypothermia, not associated with low environmental temperature: Secondary | ICD-10-CM | POA: Diagnosis present

## 2014-11-13 DIAGNOSIS — L97909 Non-pressure chronic ulcer of unspecified part of unspecified lower leg with unspecified severity: Secondary | ICD-10-CM | POA: Diagnosis present

## 2014-11-13 DIAGNOSIS — J449 Chronic obstructive pulmonary disease, unspecified: Secondary | ICD-10-CM | POA: Diagnosis present

## 2014-11-13 DIAGNOSIS — Z6841 Body Mass Index (BMI) 40.0 and over, adult: Secondary | ICD-10-CM

## 2014-11-13 DIAGNOSIS — J9811 Atelectasis: Secondary | ICD-10-CM | POA: Diagnosis present

## 2014-11-13 DIAGNOSIS — Z7901 Long term (current) use of anticoagulants: Secondary | ICD-10-CM | POA: Diagnosis not present

## 2014-11-13 DIAGNOSIS — M109 Gout, unspecified: Secondary | ICD-10-CM | POA: Diagnosis present

## 2014-11-13 DIAGNOSIS — Z978 Presence of other specified devices: Secondary | ICD-10-CM

## 2014-11-13 DIAGNOSIS — T45515A Adverse effect of anticoagulants, initial encounter: Secondary | ICD-10-CM | POA: Diagnosis present

## 2014-11-13 DIAGNOSIS — I469 Cardiac arrest, cause unspecified: Secondary | ICD-10-CM | POA: Insufficient documentation

## 2014-11-13 DIAGNOSIS — R791 Abnormal coagulation profile: Secondary | ICD-10-CM | POA: Diagnosis present

## 2014-11-13 DIAGNOSIS — E861 Hypovolemia: Secondary | ICD-10-CM | POA: Diagnosis not present

## 2014-11-13 DIAGNOSIS — E875 Hyperkalemia: Secondary | ICD-10-CM | POA: Diagnosis present

## 2014-11-13 DIAGNOSIS — I83009 Varicose veins of unspecified lower extremity with ulcer of unspecified site: Secondary | ICD-10-CM | POA: Diagnosis present

## 2014-11-13 DIAGNOSIS — D62 Acute posthemorrhagic anemia: Secondary | ICD-10-CM

## 2014-11-13 DIAGNOSIS — E785 Hyperlipidemia, unspecified: Secondary | ICD-10-CM | POA: Diagnosis present

## 2014-11-13 DIAGNOSIS — J9621 Acute and chronic respiratory failure with hypoxia: Principal | ICD-10-CM | POA: Diagnosis present

## 2014-11-13 DIAGNOSIS — R040 Epistaxis: Secondary | ICD-10-CM | POA: Diagnosis present

## 2014-11-13 DIAGNOSIS — I5032 Chronic diastolic (congestive) heart failure: Secondary | ICD-10-CM | POA: Diagnosis present

## 2014-11-13 DIAGNOSIS — G8929 Other chronic pain: Secondary | ICD-10-CM | POA: Diagnosis present

## 2014-11-13 DIAGNOSIS — N179 Acute kidney failure, unspecified: Secondary | ICD-10-CM | POA: Diagnosis not present

## 2014-11-13 DIAGNOSIS — R579 Shock, unspecified: Secondary | ICD-10-CM | POA: Diagnosis not present

## 2014-11-13 DIAGNOSIS — Z9981 Dependence on supplemental oxygen: Secondary | ICD-10-CM | POA: Diagnosis not present

## 2014-11-13 DIAGNOSIS — J189 Pneumonia, unspecified organism: Secondary | ICD-10-CM

## 2014-11-13 DIAGNOSIS — J80 Acute respiratory distress syndrome: Secondary | ICD-10-CM | POA: Diagnosis not present

## 2014-11-13 DIAGNOSIS — Z7951 Long term (current) use of inhaled steroids: Secondary | ICD-10-CM

## 2014-11-13 DIAGNOSIS — R34 Anuria and oliguria: Secondary | ICD-10-CM | POA: Diagnosis not present

## 2014-11-13 DIAGNOSIS — R4182 Altered mental status, unspecified: Secondary | ICD-10-CM

## 2014-11-13 DIAGNOSIS — J9601 Acute respiratory failure with hypoxia: Secondary | ICD-10-CM

## 2014-11-13 DIAGNOSIS — Z79899 Other long term (current) drug therapy: Secondary | ICD-10-CM | POA: Diagnosis not present

## 2014-11-13 DIAGNOSIS — I129 Hypertensive chronic kidney disease with stage 1 through stage 4 chronic kidney disease, or unspecified chronic kidney disease: Secondary | ICD-10-CM | POA: Diagnosis present

## 2014-11-13 DIAGNOSIS — Z87891 Personal history of nicotine dependence: Secondary | ICD-10-CM

## 2014-11-13 DIAGNOSIS — J9690 Respiratory failure, unspecified, unspecified whether with hypoxia or hypercapnia: Secondary | ICD-10-CM

## 2014-11-13 DIAGNOSIS — N183 Chronic kidney disease, stage 3 (moderate): Secondary | ICD-10-CM | POA: Diagnosis present

## 2014-11-13 DIAGNOSIS — E870 Hyperosmolality and hypernatremia: Secondary | ICD-10-CM | POA: Diagnosis not present

## 2014-11-13 DIAGNOSIS — J9600 Acute respiratory failure, unspecified whether with hypoxia or hypercapnia: Secondary | ICD-10-CM | POA: Insufficient documentation

## 2014-11-13 DIAGNOSIS — R911 Solitary pulmonary nodule: Secondary | ICD-10-CM | POA: Diagnosis present

## 2014-11-13 DIAGNOSIS — I4891 Unspecified atrial fibrillation: Secondary | ICD-10-CM | POA: Diagnosis present

## 2014-11-13 DIAGNOSIS — R739 Hyperglycemia, unspecified: Secondary | ICD-10-CM | POA: Diagnosis present

## 2014-11-13 DIAGNOSIS — R0602 Shortness of breath: Secondary | ICD-10-CM | POA: Diagnosis present

## 2014-11-13 DIAGNOSIS — T68XXXA Hypothermia, initial encounter: Secondary | ICD-10-CM

## 2014-11-13 DIAGNOSIS — D689 Coagulation defect, unspecified: Secondary | ICD-10-CM

## 2014-11-13 DIAGNOSIS — N170 Acute kidney failure with tubular necrosis: Secondary | ICD-10-CM | POA: Diagnosis present

## 2014-11-13 DIAGNOSIS — Z4659 Encounter for fitting and adjustment of other gastrointestinal appliance and device: Secondary | ICD-10-CM

## 2014-11-13 DIAGNOSIS — Z9289 Personal history of other medical treatment: Secondary | ICD-10-CM

## 2014-11-13 LAB — COMPREHENSIVE METABOLIC PANEL
ALT: 20 U/L (ref 0–35)
AST: 19 U/L (ref 0–37)
Albumin: 3.2 g/dL — ABNORMAL LOW (ref 3.5–5.2)
Alkaline Phosphatase: 62 U/L (ref 39–117)
Anion gap: 10 (ref 5–15)
BUN: 108 mg/dL — AB (ref 6–23)
CHLORIDE: 112 meq/L (ref 96–112)
CO2: 18 mmol/L — ABNORMAL LOW (ref 19–32)
Calcium: 8.8 mg/dL (ref 8.4–10.5)
Creatinine, Ser: 4.2 mg/dL — ABNORMAL HIGH (ref 0.50–1.10)
GFR calc Af Amer: 11 mL/min — ABNORMAL LOW (ref >90)
GFR, EST NON AFRICAN AMERICAN: 9 mL/min — AB (ref >90)
Glucose, Bld: 94 mg/dL (ref 70–99)
Potassium: 6.6 mmol/L (ref 3.5–5.1)
SODIUM: 140 mmol/L (ref 135–145)
Total Bilirubin: 0.1 mg/dL — ABNORMAL LOW (ref 0.3–1.2)
Total Protein: 5.6 g/dL — ABNORMAL LOW (ref 6.0–8.3)

## 2014-11-13 LAB — HEMOGLOBIN AND HEMATOCRIT, BLOOD
HCT: 28 % — ABNORMAL LOW (ref 36.0–46.0)
Hemoglobin: 9 g/dL — ABNORMAL LOW (ref 12.0–15.0)

## 2014-11-13 LAB — URINALYSIS, ROUTINE W REFLEX MICROSCOPIC
Bilirubin Urine: NEGATIVE
Glucose, UA: NEGATIVE mg/dL
Hgb urine dipstick: NEGATIVE
Ketones, ur: NEGATIVE mg/dL
Leukocytes, UA: NEGATIVE
NITRITE: NEGATIVE
PROTEIN: NEGATIVE mg/dL
Specific Gravity, Urine: 1.025 (ref 1.005–1.030)
UROBILINOGEN UA: 0.2 mg/dL (ref 0.0–1.0)
pH: 6 (ref 5.0–8.0)

## 2014-11-13 LAB — PROTIME-INR
INR: 7.64 (ref 0.00–1.49)
INR: 8.45 — AB (ref 0.00–1.49)
Prothrombin Time: 65.2 seconds — ABNORMAL HIGH (ref 11.6–15.2)
Prothrombin Time: 70.5 seconds — ABNORMAL HIGH (ref 11.6–15.2)

## 2014-11-13 LAB — CBC WITH DIFFERENTIAL/PLATELET
Basophils Absolute: 0 10*3/uL (ref 0.0–0.1)
Basophils Relative: 0 % (ref 0–1)
EOS PCT: 0 % (ref 0–5)
Eosinophils Absolute: 0 10*3/uL (ref 0.0–0.7)
HEMATOCRIT: 29.9 % — AB (ref 36.0–46.0)
Hemoglobin: 10 g/dL — ABNORMAL LOW (ref 12.0–15.0)
LYMPHS ABS: 0.5 10*3/uL — AB (ref 0.7–4.0)
LYMPHS PCT: 9 % — AB (ref 12–46)
MCH: 33.1 pg (ref 26.0–34.0)
MCHC: 33.4 g/dL (ref 30.0–36.0)
MCV: 99 fL (ref 78.0–100.0)
Monocytes Absolute: 0.1 10*3/uL (ref 0.1–1.0)
Monocytes Relative: 3 % (ref 3–12)
Neutro Abs: 5 10*3/uL (ref 1.7–7.7)
Neutrophils Relative %: 88 % — ABNORMAL HIGH (ref 43–77)
Platelets: 192 10*3/uL (ref 150–400)
RBC: 3.02 MIL/uL — ABNORMAL LOW (ref 3.87–5.11)
RDW: 15.5 % (ref 11.5–15.5)
WBC: 5.7 10*3/uL (ref 4.0–10.5)

## 2014-11-13 LAB — I-STAT CHEM 8, ED
BUN: 115 mg/dL — ABNORMAL HIGH (ref 6–23)
CALCIUM ION: 1.23 mmol/L (ref 1.13–1.30)
Chloride: 110 mEq/L (ref 96–112)
Creatinine, Ser: 3.9 mg/dL — ABNORMAL HIGH (ref 0.50–1.10)
GLUCOSE: 120 mg/dL — AB (ref 70–99)
HEMATOCRIT: 29 % — AB (ref 36.0–46.0)
HEMOGLOBIN: 9.9 g/dL — AB (ref 12.0–15.0)
Potassium: 7 mmol/L (ref 3.5–5.1)
Sodium: 137 mmol/L (ref 135–145)
TCO2: 17 mmol/L (ref 0–100)

## 2014-11-13 LAB — ABO/RH: ABO/RH(D): AB POS

## 2014-11-13 LAB — BASIC METABOLIC PANEL
Anion gap: 7 (ref 5–15)
BUN: 105 mg/dL — AB (ref 6–23)
CHLORIDE: 109 meq/L (ref 96–112)
CO2: 21 mmol/L (ref 19–32)
Calcium: 8.5 mg/dL (ref 8.4–10.5)
Creatinine, Ser: 4.44 mg/dL — ABNORMAL HIGH (ref 0.50–1.10)
GFR calc Af Amer: 10 mL/min — ABNORMAL LOW (ref >90)
GFR calc non Af Amer: 9 mL/min — ABNORMAL LOW (ref >90)
GLUCOSE: 131 mg/dL — AB (ref 70–99)
Potassium: 6.9 mmol/L (ref 3.5–5.1)
Sodium: 137 mmol/L (ref 135–145)

## 2014-11-13 LAB — LACTIC ACID, PLASMA: Lactic Acid, Venous: 0.8 mmol/L (ref 0.5–2.2)

## 2014-11-13 MED ORDER — PIPERACILLIN-TAZOBACTAM IN DEX 2-0.25 GM/50ML IV SOLN
2.2500 g | Freq: Three times a day (TID) | INTRAVENOUS | Status: DC
  Filled 2014-11-13 (×2): qty 50

## 2014-11-13 MED ORDER — VITAMIN K1 10 MG/ML IJ SOLN
10.0000 mg | Freq: Once | INTRAMUSCULAR | Status: AC
  Administered 2014-11-13: 10 mg via SUBCUTANEOUS
  Filled 2014-11-13: qty 1

## 2014-11-13 MED ORDER — PANTOPRAZOLE SODIUM 40 MG IV SOLR
40.0000 mg | INTRAVENOUS | Status: DC
  Administered 2014-11-14 – 2014-11-24 (×11): 40 mg via INTRAVENOUS
  Filled 2014-11-13 (×13): qty 40

## 2014-11-13 MED ORDER — VITAMIN K1 10 MG/ML IJ SOLN
5.0000 mg | Freq: Every day | INTRAVENOUS | Status: DC
  Administered 2014-11-14: 5 mg via INTRAVENOUS
  Filled 2014-11-13 (×3): qty 0.5

## 2014-11-13 MED ORDER — SODIUM BICARBONATE 8.4 % IV SOLN
25.0000 meq | Freq: Once | INTRAVENOUS | Status: AC
  Administered 2014-11-13: 25 meq via INTRAVENOUS
  Filled 2014-11-13: qty 50

## 2014-11-13 MED ORDER — SODIUM CHLORIDE 0.9 % IV BOLUS (SEPSIS)
500.0000 mL | Freq: Once | INTRAVENOUS | Status: AC
  Administered 2014-11-13: 500 mL via INTRAVENOUS

## 2014-11-13 MED ORDER — CALCIUM GLUCONATE 10 % IV SOLN
1.0000 g | Freq: Once | INTRAVENOUS | Status: AC
  Administered 2014-11-13: 1 g via INTRAVENOUS
  Filled 2014-11-13: qty 10

## 2014-11-13 MED ORDER — DEXTROSE 50 % IV SOLN
50.0000 mL | Freq: Once | INTRAVENOUS | Status: AC
  Administered 2014-11-13: 50 mL via INTRAVENOUS

## 2014-11-13 MED ORDER — SODIUM CHLORIDE 0.9 % IV BOLUS (SEPSIS)
1000.0000 mL | Freq: Once | INTRAVENOUS | Status: AC
  Administered 2014-11-13: 1000 mL via INTRAVENOUS

## 2014-11-13 MED ORDER — INSULIN ASPART 100 UNIT/ML ~~LOC~~ SOLN
2.0000 [IU] | Freq: Once | SUBCUTANEOUS | Status: AC
  Administered 2014-11-13: 2 [IU] via INTRAVENOUS
  Filled 2014-11-13: qty 1

## 2014-11-13 MED ORDER — FUROSEMIDE 10 MG/ML IJ SOLN
20.0000 mg | Freq: Once | INTRAMUSCULAR | Status: AC
  Administered 2014-11-13: 20 mg via INTRAVENOUS
  Filled 2014-11-13: qty 2

## 2014-11-13 MED ORDER — SODIUM CHLORIDE 0.9 % IV SOLN: Freq: Once | INTRAVENOUS | Status: DC

## 2014-11-13 MED ORDER — IPRATROPIUM-ALBUTEROL 0.5-2.5 (3) MG/3ML IN SOLN
3.0000 mL | Freq: Four times a day (QID) | RESPIRATORY_TRACT | Status: DC
  Administered 2014-11-13 – 2014-11-25 (×46): 3 mL via RESPIRATORY_TRACT
  Filled 2014-11-13 (×5): qty 3
  Filled 2014-11-13: qty 39
  Filled 2014-11-13 (×40): qty 3

## 2014-11-13 MED ORDER — SODIUM CHLORIDE 0.9 % IV SOLN: INTRAVENOUS | Status: DC

## 2014-11-13 MED ORDER — DEXTROSE 5 % IV SOLN
500.0000 mg | Freq: Once | INTRAVENOUS | Status: AC
  Administered 2014-11-13: 500 mg via INTRAVENOUS
  Filled 2014-11-13: qty 500

## 2014-11-13 MED ORDER — PANTOPRAZOLE SODIUM 40 MG IV SOLR
40.0000 mg | Freq: Once | INTRAVENOUS | Status: AC
Start: 2014-11-13 — End: 2014-11-13
  Administered 2014-11-13: 40 mg via INTRAVENOUS
  Filled 2014-11-13: qty 40

## 2014-11-13 MED ORDER — DEXTROSE 5 % IV SOLN
1.0000 g | Freq: Once | INTRAVENOUS | Status: AC
  Administered 2014-11-13: 1 g via INTRAVENOUS
  Filled 2014-11-13: qty 10

## 2014-11-13 MED ORDER — SODIUM BICARBONATE 8.4 % IV SOLN
25.0000 meq | Freq: Once | INTRAVENOUS | Status: DC
Start: 2014-11-13 — End: 2014-11-13

## 2014-11-13 MED ORDER — FUROSEMIDE 10 MG/ML IJ SOLN
80.0000 mg | Freq: Once | INTRAMUSCULAR | Status: AC
Start: 2014-11-13 — End: 2014-11-14
  Administered 2014-11-14: 80 mg via INTRAVENOUS
  Filled 2014-11-13: qty 8

## 2014-11-13 NOTE — ED Notes (Signed)
Dr. Krishnan at bedside. 

## 2014-11-13 NOTE — Progress Notes (Signed)
eLink Physician-Brief Progress Note Patient Name: Paula MartesCarolyn M Fletcher DOB: January 27, 1935 MRN: [id number]   Date of Service  12/06/2014  HPI/Events of Note  80 yo F with Afib on Coumadin, presents with epistaxis, found to have supratherapeutic INR, low temp, Hb=10 and mild resp distress.  eICU Interventions  Cont with monitor coags Reverse INR to therapeutic level (MVR) Monitor sats If epistaxis increases consider ENT eval      Intervention Category Intermediate Interventions: Coagulopathy - evaluation and management Evaluation Type: New Patient Evaluation  Paula Fletcher 11/12/2014, 8:46 PM

## 2014-11-13 NOTE — ED Notes (Signed)
Emptied 675 ml urine from foley bag.

## 2014-11-13 NOTE — Progress Notes (Signed)
ANTIBIOTIC CONSULT NOTE - INITIAL  Pharmacy Consult for Zosyn Indication: aspiration pna  No Known Allergies  Patient Measurements:    Vital Signs: Temp: 97.3 F (36.3 C) (01/05 1934) Temp Source: Core (Comment) (01/05 1900) BP: 113/40 mmHg (01/05 1934) Pulse Rate: 71 (01/05 1934) Intake/Output from previous day:   Intake/Output from this shift:    Labs:  Recent Labs  June 13, 2015 0921 June 13, 2015 1119  WBC 5.7  --   HGB 10.0* 9.9*  PLT 192  --   CREATININE 4.44* 3.90*   CrCl cannot be calculated (Unknown ideal weight.).    Microbiology: Recent Results (from the past 720 hour(s))  Culture, blood (single)     Status: None (Preliminary result)   Collection Time: June 13, 2015 11:13 AM  Result Value Ref Range Status   Specimen Description Blood  Final   Special Requests NONE  Final   Culture NO GROWTH <24 HRS  Final   Report Status PENDING  Incomplete    Medical History: Past Medical History  Diagnosis Date  . COPD (chronic obstructive pulmonary disease)   . Essential hypertension, benign   . Hyperlipidemia   . Gout   . Pleural effusion, bilateral     Noted February 2015 in the setting of pneumonia and diastolic heart failure  . CAP (community acquired pneumonia)   . Chronic diastolic heart failure     LVEF 60-65%, grade 1 diastolic dysfunction  . Atrial fibrillation     Diagnosed February 2015  . Lung nodule     7 mm right lower lobe pulmonary nodule - chest CT February 2015  . Mitral stenosis     Mild to moderate    Medications:  Prescriptions prior to admission  Medication Sig Dispense Refill Last Dose  . allopurinol (ZYLOPRIM) 300 MG tablet Take 150 mg by mouth daily.    11/12/2014 at Unknown time  . cephALEXin (KEFLEX) 500 MG capsule Take 500 mg by mouth 3 (three) times daily.   11/12/2014 at Unknown time  . Fluticasone-Salmeterol (ADVAIR) 250-50 MCG/DOSE AEPB Inhale 1 puff into the lungs 2 (two) times daily.   11/12/2014 at Unknown time  . furosemide (LASIX)  40 MG tablet Take 40-80 mg by mouth 3 (three) times daily. 40 mg in the morning, 80 mg at lunch, and 40 mg in the evenings   11/12/2014 at Unknown time  . gabapentin (NEURONTIN) 300 MG capsule Take 300 mg by mouth 2 (two) times daily.    11/12/2014 at Unknown time  . HYDROcodone-acetaminophen (NORCO/VICODIN) 5-325 MG per tablet Take 0.5 tablets by mouth every 4 (four) hours as needed for moderate pain.    11/12/2014 at Unknown time  . losartan (COZAAR) 100 MG tablet Take 0.5 tablets (50 mg total) by mouth daily.   11/12/2014 at Unknown time  . metoprolol (LOPRESSOR) 50 MG tablet Take 1 tablet (50 mg total) by mouth 2 (two) times daily. Take 1 tablet in the morning and 1 tablet in the evening.   11/12/2014 at 0630  . NIFEdipine (PROCARDIA-XL/ADALAT-CC/NIFEDICAL-XL) 30 MG 24 hr tablet Take 30 mg by mouth daily.   11/12/2014 at Unknown time  . rosuvastatin (CRESTOR) 40 MG tablet Take 40 mg by mouth at bedtime.    11/12/2014 at Unknown time  . sulfamethoxazole-trimethoprim (BACTRIM DS,SEPTRA DS) 800-160 MG per tablet Take 1 tablet by mouth 2 (two) times daily.   11/12/2014 at Unknown time  . tiotropium (SPIRIVA) 18 MCG inhalation capsule Place 18 mcg into inhaler and inhale daily.     .Marland Kitchen  warfarin (COUMADIN) 5 MG tablet Take 2.5-5 mg by mouth daily at 6 PM.   11/11/2014  . albuterol (PROVENTIL HFA;VENTOLIN HFA) 108 (90 BASE) MCG/ACT inhaler Inhale 2 puffs into the lungs 2 (two) times daily. (Patient not taking: Reported on 11/24/2014) 1 Inhaler 2 Taking  . predniSONE (DELTASONE) 20 MG tablet Take 1 tablet (20 mg total) by mouth daily as needed (For gout pain). (Patient not taking: Reported on 12/01/2014) 5 tablet 0 Taking  . warfarin (COUMADIN) 5 MG tablet Take 1.5 tablets (7.5 mg total) by mouth daily. Take 1 and a 1/2 tablets each evening. Further dosing will be given to you by the Coumadin clinic. (Patient not taking: Reported on 11/17/2014) 45 tablet 2 Taking   Assessment: 80 yo F presented to [facility] ED with epistaxis, generalized  weakness, and SOB.  In ED found to have elevated INR >7 (on Coumadin PTA), anemia, hyperkalemia (K=7) , and ARF (SCr 3.9).  Pts SCr was 1 in Feb 2015.  Pt was transferred to [facility] ICU.  Pt is hypothermic (T 95.3) and hypotensive.  Pt reports she may have had blood go down her throat.  To start Zosyn for r/o aspiration PNA.     Goal of Therapy:  Renal dose adjustment of antibiotics  Plan:  Zosyn 2.25gm IV q8h. Will monitor closely for dose adjustments based on renal function trend. Follow-up K Follow-up anticoag plans pending resolve of epistaxis and correction of INR.  [employer], Pharm.D., BCPS Clinical Pharmacist Pager [phone] 11/30/2014 7:51 PM

## 2014-11-13 NOTE — ED Notes (Signed)
Pt's rectal temp was 94.  Verified x 2 .   Notified Dr. Adriana Simasook, bearhugger in place and will hand warm IV fluids.  Pt alert, pleasant.

## 2014-11-13 NOTE — Progress Notes (Signed)
Called by emergency room for admission for patient.  80 year old female with past mental history of atrial fibrillation on chronic Coumadin. Lab work 11 months ago noted normal renal function, hemoglobin at 14. Patient presents to emergency room with weakness and severe nosebleed. Patient found to be hyperkalemic, acute renal failure, anemic and INR supratherapeutic at 7.6. Chest x-ray notes volume overload and questionable pneumonia.  Patient given insulin/D50, vitamin K and dose of IV Rocephin and Zithromax.  Initially mentating fine with good oxygen saturations, but then patient begins to decompensate with worsening oxygen saturations. BiPAP started, but despite BiPAP, oxygen saturations are 93%. [facility].  Ordered 1 dose of Lasix  Suspect patient is an acute congestive heart failure which has led to volume overload leading to secondary hepatic congestion and decreased clearance of Coumadin which has led to supratherapeutic INR and likely bleeding-suspect recent GI bleed in addition to nosebleed which has caused severe acute renal failure and secondary hyperkalemia.  Biggest concern is that with her decreased respiratory status and still only 92% on BiPAP, she could worsen from respiratory standpoint.  Have spoken with Dr. Sung AmabileSimonds of [facility] who accepted the patient status has worsened.

## 2014-11-13 NOTE — Progress Notes (Signed)
18:40 pt arrived to 76M. Blood oozing from nose and around foley site. Foley care performed and pt's face cleansed with cloth. Elink notified of pt's arrival. Pt A&O, NAD. Slight SOB and does not tolerating lowering of HOB but denies pain.

## 2014-11-13 NOTE — ED Notes (Signed)
Patient's O2 saturation 81% at this time. Patient is on veni-mask at 50% at this time. Paged respiratory.

## 2014-11-13 NOTE — ED Notes (Signed)
Pt reports around 1230 yesterday started having a headache.  Then reports nose bleed, generalized weakness, sob, and difficulty speaking.  Reports weakness is generalized.  Pt says is on 02 at 4 liters continuously at home.

## 2014-11-13 NOTE — Progress Notes (Signed)
eLink Physician-Brief Progress Note Patient Name: Paula MartesCarolyn M Fletcher DOB: [birth year] MRN: [id number]   Date of Service  11/12/2014  HPI/Events of Note  INR=8.45  eICU Interventions  4u FFP ordered Pt got 10mg  vit K subcutaneous earlier     Intervention Category Intermediate Interventions: Coagulopathy - evaluation and management  Rhoderick Farrel 11/12/2014, 8:42 PM

## 2014-11-13 NOTE — ED Notes (Signed)
Patient requested this nurse call her daughter Delice Bisonara at [phone] and "tell her what's going on and what is wrong with me." Spoke with Delice Bisonara at this time and explained patient's condition and treatment.

## 2014-11-13 NOTE — Progress Notes (Signed)
CRITICAL VALUE ALERT  Critical value received:  INR: 8.45  Date of notification:  11/11/2014   Time of notification:  2015  Critical value read back:Yes.    Nurse who received alert:  Fransisco HertzJones, Daking Westervelt D, RN   MD notified (1st page):  E-Link Nurse: Magda PaganiniAudrey RN  Time of first page:  2025  MD notified (2nd page):  Time of second page:  Responding MD:  Dr. Dema SeverinMungal  Time MD responded:  2040 Orders received.

## 2014-11-13 NOTE — ED Notes (Signed)
CRITICAL VALUE ALERT  Critical value received:  PT 65.2 INR 7.64  Date of notification:  12/06/2014  Time of notification:  0949  Critical value read back:Yes.    Nurse who received alert:  Rminter, rn  MD notified (1st page):  Dr. Adriana Simasook  Time of first page:  [phone]  MD notified (2nd page):  Time of second page:  Responding MD:  Dr. Adriana Simasook  Time MD responded:  [phone]

## 2014-11-13 NOTE — Progress Notes (Signed)
RT placed pt. On face tent for humidity as per MD request. Pt. Has nasal bleeding.

## 2014-11-13 NOTE — ED Notes (Signed)
Patient's daughter, Delice Bisonara, at bedside. Daughter leaving and taking patient's belongings including clothes, glasses, pocketbook, and cane, except patient's shoes. States "she wants to keep her shoes." Advised daughter shoes would be given to transport service.

## 2014-11-13 NOTE — ED Notes (Signed)
Pt states her nose started bleeding yesterday. States she is on coumadin and that might be why her nose is bleeding. Also, complain of generalized weakness and being SOB

## 2014-11-13 NOTE — ED Notes (Signed)
CRITICAL VALUE ALERT  Critical value received:  Potassium  Date of notification:  11/21/2014  Time of notification:  1006  Critical value read back:Yes.    Nurse who received alert:  Rennie Hack,RN  MD notified (1st page):  Adriana Simasook  Time of first page:  1007  Verbal order for istat chem 8 to recheck potassium

## 2014-11-13 NOTE — ED Provider Notes (Signed)
CSN: [id number]     Arrival date & time 12-13-14  5621 History  This chart was scribed for Donnetta Hutching, MD by Tonye Royalty, ED Scribe. This patient was seen in room [facility] and the patient's care was started at 9:23 AM.    Chief Complaint  Patient presents with  . Epistaxis  . Shortness of Breath   The history is provided by the patient. No language interpreter was used.    HPI Comments: Paula Fletcher is a 80 y.o. female who takes Coumadin and is on 4L O2 at home continuously who presents to the Emergency Department complaining of nosebleed with onset yesterday at 1230. She reports associated headache, generalized weakness, SOB, and some difficulty speaking. She states epistaxis resolved yesterday, then recurred overnight. She states her bleeding was "like a stream," worse on the right. She states some blood went down her throat She states she lives at home with her husband.   PCP: Dr. Juanetta Gosling  Past Medical History  Diagnosis Date  . COPD (chronic obstructive pulmonary disease)   . Essential hypertension, benign   . Hyperlipidemia   . Gout   . Pleural effusion, bilateral     Noted February 2015 in the setting of pneumonia and diastolic heart failure  . CAP (community acquired pneumonia)   . Chronic diastolic heart failure     LVEF 60-65%, grade 1 diastolic dysfunction  . Atrial fibrillation     Diagnosed February 2015  . Lung nodule     7 mm right lower lobe pulmonary nodule - chest CT February 2015  . Mitral stenosis     Mild to moderate   Past Surgical History  Procedure Laterality Date  . Cholecystectomy    . Appendectomy    . Abdominal hysterectomy     Family History  Problem Relation Age of Onset  . Heart failure Mother    History  Substance Use Topics  . Smoking status: Former Smoker    Types: Cigarettes  . Smokeless tobacco: Former Neurosurgeon  . Alcohol Use: No   OB History    No data available     Review of Systems A complete 10 system review of systems was  obtained and all systems are negative except as noted in the HPI and PMH.    Allergies  Review of patient's allergies indicates no known allergies.  Home Medications   Prior to Admission medications   Medication Sig Start Date End Date Taking? Authorizing Provider  allopurinol (ZYLOPRIM) 300 MG tablet Take 150 mg by mouth daily.    Yes Historical Provider, MD  cephALEXin (KEFLEX) 500 MG capsule Take 500 mg by mouth 3 (three) times daily.   Yes Historical Provider, MD  Fluticasone-Salmeterol (ADVAIR) 250-50 MCG/DOSE AEPB Inhale 1 puff into the lungs 2 (two) times daily.   Yes Historical Provider, MD  furosemide (LASIX) 40 MG tablet Take 40-80 mg by mouth 3 (three) times daily. 40 mg in the morning, 80 mg at lunch, and 40 mg in the evenings 12/17/13  Yes Elliot Cousin, MD  gabapentin (NEURONTIN) 300 MG capsule Take 300 mg by mouth 2 (two) times daily.    Yes Historical Provider, MD  HYDROcodone-acetaminophen (NORCO/VICODIN) 5-325 MG per tablet Take 0.5 tablets by mouth every 4 (four) hours as needed for moderate pain.    Yes Historical Provider, MD  losartan (COZAAR) 100 MG tablet Take 0.5 tablets (50 mg total) by mouth daily. 12/17/13  Yes Elliot Cousin, MD  metoprolol (LOPRESSOR) 50 MG tablet Take  1 tablet (50 mg total) by mouth 2 (two) times daily. Take 1 tablet in the morning and 1 tablet in the evening. 12/17/13  Yes Elliot Cousin, MD  NIFEdipine (PROCARDIA-XL/ADALAT-CC/NIFEDICAL-XL) 30 MG 24 hr tablet Take 30 mg by mouth daily.   Yes Historical Provider, MD  rosuvastatin (CRESTOR) 40 MG tablet Take 40 mg by mouth at bedtime.    Yes Historical Provider, MD  sulfamethoxazole-trimethoprim (BACTRIM DS,SEPTRA DS) 800-160 MG per tablet Take 1 tablet by mouth 2 (two) times daily.   Yes Historical Provider, MD  tiotropium (SPIRIVA) 18 MCG inhalation capsule Place 18 mcg into inhaler and inhale daily.   Yes Historical Provider, MD  warfarin (COUMADIN) 5 MG tablet Take 2.5-5 mg by mouth daily at 6 PM.   Yes  Historical Provider, MD  albuterol (PROVENTIL HFA;VENTOLIN HFA) 108 (90 BASE) MCG/ACT inhaler Inhale 2 puffs into the lungs 2 (two) times daily. Patient not taking: Reported on 11/21/2014 12/17/13   Elliot Cousin, MD  predniSONE (DELTASONE) 20 MG tablet Take 1 tablet (20 mg total) by mouth daily as needed (For gout pain). Patient not taking: Reported on 11/19/2014 12/17/13   Elliot Cousin, MD  warfarin (COUMADIN) 5 MG tablet Take 1.5 tablets (7.5 mg total) by mouth daily. Take 1 and a 1/2 tablets each evening. Further dosing will be given to you by the Coumadin clinic. Patient not taking: Reported on 12/09/2014 12/17/13   Elliot Cousin, MD   BP 109/46 mmHg  Pulse 61  Temp(Src) 94.6 F (34.8 C) (Core (Comment))  Resp 19  SpO2 90% Physical Exam  Constitutional: She is oriented to person, place, and time.  Elderly and frail  HENT:  Head: Normocephalic and atraumatic.  Crusted blood at right naris  Eyes: Conjunctivae and EOM are normal. Pupils are equal, round, and reactive to light.  Neck: Normal range of motion. Neck supple.  Cardiovascular: Normal rate and regular rhythm.   Pulmonary/Chest: Effort normal and breath sounds normal.  Abdominal: Soft. Bowel sounds are normal.  Musculoskeletal: Normal range of motion.  Neurological: She is alert and oriented to person, place, and time.  Answering questions but slow to respond  Skin: Skin is warm and dry.  Psychiatric: She has a normal mood and affect. Her behavior is normal.  Nursing note and vitals reviewed.   ED Course  Procedures (including critical care time)  DIAGNOSTIC STUDIES: Oxygen Saturation is 92% on nasal canula, adequate by my interpretation.    COORDINATION OF CARE: 9:30 AM Discussed treatment plan with patient at beside, the patient agrees with the plan and has no further questions at this time.   Labs Review Labs Reviewed  CBC WITH DIFFERENTIAL - Abnormal; Notable for the following:    RBC 3.02 (*)    Hemoglobin 10.0 (*)     HCT 29.9 (*)    Neutrophils Relative % 88 (*)    Lymphocytes Relative 9 (*)    Lymphs Abs 0.5 (*)    All other components within normal limits  BASIC METABOLIC PANEL - Abnormal; Notable for the following:    Potassium 6.9 (*)    Glucose, Bld 131 (*)    BUN 105 (*)    Creatinine, Ser 4.44 (*)    GFR calc non Af Amer 9 (*)    GFR calc Af Amer 10 (*)    All other components within normal limits  PROTIME-INR - Abnormal; Notable for the following:    Prothrombin Time 65.2 (*)    INR 7.64 (*)  All other components within normal limits  I-STAT CHEM 8, ED - Abnormal; Notable for the following:    Potassium 7.0 (*)    BUN 115 (*)    Creatinine, Ser 3.90 (*)    Glucose, Bld 120 (*)    Hemoglobin 9.9 (*)    HCT 29.0 (*)    All other components within normal limits  CULTURE, BLOOD (SINGLE)  CULTURE, BLOOD (SINGLE)  URINALYSIS, ROUTINE W REFLEX MICROSCOPIC  LACTIC ACID, PLASMA    Imaging Review Ct Head Wo Contrast  Nov 27, 2014   CLINICAL DATA:  80 year old female with altered mental status and shortness of Breath. Epistaxis, on Coumadin. Initial encounter.  EXAM: CT HEAD WITHOUT CONTRAST  TECHNIQUE: Contiguous axial images were obtained from the base of the skull through the vertex without intravenous contrast.  COMPARISON:  None.  FINDINGS: Study is intermittently degraded by motion artifact despite repeated imaging attempts.  Opacifications/debris in the right nasal cavity and to a lesser extent right nasopharynx. Visualized paranasal sinuses and mastoids are clear.  Visualized orbits and scalp soft tissues are within normal limits.  No acute osseous abnormality identified.  Calcified atherosclerosis at the skull base. Cerebral volume is within normal limits for age. Patchy mostly periventricular cerebral white matter hypodensity. No midline shift, mass effect, or evidence of intracranial mass lesion. No ventriculomegaly. No suspicious intracranial vascular hyperdensity. No evidence of  cortically based acute infarction identified.  IMPRESSION: 1. No acute intracranial abnormality. Mild to moderate for age nonspecific white matter changes. 2. Opacification of the right nasal cavity and nasopharynx may be related to epistaxis. Negative paranasal sinuses.   Electronically Signed   By: Augusto Gamble M.D.   On: 27-Nov-2014 10:26   Dg Chest Portable 1 View  11/27/14   CLINICAL DATA:  80 year old female with shortness of breath generalized weakness. On Coumadin, epistaxis. Initial encounter.  EXAM: PORTABLE CHEST - 1 VIEW  COMPARISON:  Chest CT 12/16/2013 and earlier.  FINDINGS: Portable AP upright view at 0927 hrs. Chronic lung base hypo ventilation. A combination of pleural fluid and consolidation was present at the lung bases on the 12/16/2013 comparison. Ventilation today is mildly improved compared to 12/15/2013. Stable cardiac size and mediastinal contours. No pneumothorax. No large pleural effusion. Pulmonary vascular congestion appears increased. Stable visualized osseous structures.  IMPRESSION: Chronically poor ventilation at both lung bases over this series of exams. Currently suspect pulmonary vascular congestion/ mild interstitial edema with severe bibasilar atelectasis. Lung base pneumonia is difficult to exclude.   Electronically Signed   By: Augusto Gamble M.D.   On: 11-27-14 09:50     EKG Interpretation   Date/Time:  Tuesday 11/27/14 09:12:40 EST Ventricular Rate:  60 PR Interval:  257 QRS Duration: 112 QT Interval:  487 QTC Calculation: 487 R Axis:   -146 Text Interpretation:  Sinus or ectopic atrial rhythm Prolonged PR interval  Probable lateral infarct, age indeterminate Confirmed by Abrahan Fulmore  MD, Antwanette Wesche  [phone]) on 27-Nov-2014 9:40:59 AM     CRITICAL CARE Performed by: Donnetta Hutching Total critical care time: 60 Critical care time was exclusive of separately billable procedures and treating other patients. Critical care was necessary to treat or prevent imminent or  life-threatening deterioration. Critical care was time spent personally by me on the following activities: development of treatment plan with patient and/or surrogate as well as nursing, discussions with consultants, evaluation of patient's response to treatment, examination of patient, obtaining history from patient or surrogate, ordering and performing treatments and interventions, ordering and  review of laboratory studies, ordering and review of radiographic studies, pulse oximetry and re-evaluation of patient's condition. MDM   Final diagnoses:  Altered mental status  Healthcare-associated pneumonia  Hyperkalemia  Hypothermia, initial encounter    Complex patient with hypothermia, hyperkalemia, hyper anticoagulation, and possible pneumonia.   Rx bear hugger,  Rx for hyperkalemia, vitamin K 10 mg subcutaneous, IV Rocephin/IV Zithromax, BiPAP.    [facility]ially [facility].   Admit to general medicine at local hospital     I personally performed the services described in this documentation, which was scribed in my presence. The recorded information has been reviewed and is accurate.    Donnetta HutchingBrian Macarius Ruark, MD 12/17/2014 1330

## 2014-11-13 NOTE — ED Notes (Signed)
Patient's O2 line attached to oxygen tank. Oxygen tank empty. Connected oxygen tubing to wall O2. Patient O2 saturation increased to 90%.

## 2014-11-13 NOTE — Progress Notes (Signed)
CRITICAL VALUE ALERT  Critical value received:  K: 6.6  Date of notification:  12/05/2014   Time of notification:  2120  Critical value read back:Yes.    Nurse who received alert:  Fransisco HertzJones, Khloie Hamada D, RN  MD notified (1st page):  Dr. Dema SeverinMungal  Time of first page:  2122  MD notified (2nd page):   Time of second page:  Responding MD:  Dr. Dema SeverinMungal  Time MD responded:  2122  Informed MD on pt's Cr: 4.2 and BUN:108. No new orders at this time.

## 2014-11-13 NOTE — Progress Notes (Signed)
2:15 PM 11/29/2014= Call from Dr Adriana Simasook of [facility]. 79 lives @ home. Presented for dyspnea, epistaxis but hypothermic 14F on arrival, and K 7, Creatg 4.s and improved 3s with fluids. INR 7s due to coumadin. Looks well per Dr Adriana Simascook. Due to improved creat and looking ok, advised to give Dr Juanetta GoslingHawkins PCP a call [facility]. Lactate recomendd. IF trend alters, [facility] service can take over           Dr. Kalman ShanMurali Gaven Eugene, M.D., [facility].P Pulmonary and Critical Care Medicine Staff Physician [facility] Pager: [phone], If no answer or between  15:00h - 7:00h: call [phone]  11/10/2014 12:19 PM

## 2014-11-13 NOTE — H&P (Signed)
PULMONARY / CRITICAL CARE MEDICINE   Name: PIEDAD STANDIFORD MRN: [id number] DOB: 1935-01-06    ADMISSION DATE:  Nov 17, 2014 CONSULTATION DATE:  11/17/14  [facility]ING MD :  [facility]  CHIEF COMPLAINT:  Nose bleed, generalized weakness, SOB  INITIAL PRESENTATION:  80 y.o. F brought to [facility] on 01/05 for epistaxis, generalized weakness, and SOB.  In ED, found to have coagulopathy, anemia, hyperkalemia, acute renal failure.  Request made to transfer to [facility] ICU, PCCM consulted.   STUDIES:  01/05 CT Head: no acute abnormality, opacification of right nasal cavity and nasopharynx, may be related to epistaxis.   SIGNIFICANT EVENTS: 01/05 - admitted to [facility] ICU   HISTORY OF PRESENT ILLNESS:   Paula Fletcher is a 80 y.o. F with PMH as outlined below including 4L O2 dependent COPD and A.fib on chronic coumadin, She presented to [facility] on 01/05 for epistaxis, generalized weakness, and SOB.  Epistaxis began roughly one day prior at 1230 and resolved spontaneously prior to re-starting again shortly thereafter.  She reports that bleeding was "like a stream", primarily out of the right nare.  Also believes that some blood went down her throat. In ED, she was found to have temp of 94 rectally, supratherapeutic INR at 7.64, anemia with Hgb 10, acute renal failure with SCr 3.90, hyperkalemia with K of 7.  In addition, she was started on BiPAP which was later changed to [facility].  Request was made to transfer to [facility] ICU for concerns that pt may deteriorate.  PCCM consulted for admission.  On arrival to [facility], pt noted to still be slowly bleeding from b/l nares as well as new bleeding from foley site.   PAST MEDICAL HISTORY :   has a past medical history of COPD (chronic obstructive pulmonary disease); Essential hypertension, benign; Hyperlipidemia; Gout; Pleural effusion, bilateral; CAP (community acquired pneumonia); Chronic diastolic heart failure; Atrial fibrillation; Lung nodule; and Mitral stenosis.  has past  surgical history that includes Cholecystectomy; Appendectomy; and Abdominal hysterectomy. Prior to Admission medications   Medication Sig Start Date End Date Taking? Authorizing Provider  allopurinol (ZYLOPRIM) 300 MG tablet Take 150 mg by mouth daily.    Yes Historical Provider, MD  cephALEXin (KEFLEX) 500 MG capsule Take 500 mg by mouth 3 (three) times daily.   Yes Historical Provider, MD  Fluticasone-Salmeterol (ADVAIR) 250-50 MCG/DOSE AEPB Inhale 1 puff into the lungs 2 (two) times daily.   Yes Historical Provider, MD  furosemide (LASIX) 40 MG tablet Take 40-80 mg by mouth 3 (three) times daily. 40 mg in the morning, 80 mg at lunch, and 40 mg in the evenings 12/17/13  Yes Elliot Cousin, MD  gabapentin (NEURONTIN) 300 MG capsule Take 300 mg by mouth 2 (two) times daily.    Yes Historical Provider, MD  HYDROcodone-acetaminophen (NORCO/VICODIN) 5-325 MG per tablet Take 0.5 tablets by mouth every 4 (four) hours as needed for moderate pain.    Yes Historical Provider, MD  losartan (COZAAR) 100 MG tablet Take 0.5 tablets (50 mg total) by mouth daily. 12/17/13  Yes Elliot Cousin, MD  metoprolol (LOPRESSOR) 50 MG tablet Take 1 tablet (50 mg total) by mouth 2 (two) times daily. Take 1 tablet in the morning and 1 tablet in the evening. 12/17/13  Yes Elliot Cousin, MD  NIFEdipine (PROCARDIA-XL/ADALAT-CC/NIFEDICAL-XL) 30 MG 24 hr tablet Take 30 mg by mouth daily.   Yes Historical Provider, MD  rosuvastatin (CRESTOR) 40 MG tablet Take 40 mg by mouth at bedtime.    Yes Historical  Provider, MD  sulfamethoxazole-trimethoprim (BACTRIM DS,SEPTRA DS) 800-160 MG per tablet Take 1 tablet by mouth 2 (two) times daily.   Yes Historical Provider, MD  tiotropium (SPIRIVA) 18 MCG inhalation capsule Place 18 mcg into inhaler and inhale daily.   Yes Historical Provider, MD  warfarin (COUMADIN) 5 MG tablet Take 2.5-5 mg by mouth daily at 6 PM.   Yes Historical Provider, MD  albuterol (PROVENTIL HFA;VENTOLIN HFA) 108 (90 BASE)  MCG/ACT inhaler Inhale 2 puffs into the lungs 2 (two) times daily. Patient not taking: Reported on 2014/12/05 12/17/13   Elliot Cousin, MD  predniSONE (DELTASONE) 20 MG tablet Take 1 tablet (20 mg total) by mouth daily as needed (For gout pain). Patient not taking: Reported on 12-05-14 12/17/13   Elliot Cousin, MD  warfarin (COUMADIN) 5 MG tablet Take 1.5 tablets (7.5 mg total) by mouth daily. Take 1 and a 1/2 tablets each evening. Further dosing will be given to you by the Coumadin clinic. Patient not taking: Reported on 12-05-2014 12/17/13   Elliot Cousin, MD   No Known Allergies  FAMILY HISTORY:  Family History  Problem Relation Age of Onset  . Heart failure Mother     SOCIAL HISTORY:  reports that she has quit smoking. Her smoking use included Cigarettes. She smoked 0.00 packs per day. She has quit using smokeless tobacco. She reports that she does not drink alcohol or use illicit drugs.  REVIEW OF SYSTEMS:   All negative; except for those that are bolded, which indicate positives.  Constitutional: weight loss, weight gain, night sweats, fevers, chills, fatigue, weakness.  HEENT: headaches, sore throat, sneezing, nasal congestion, post nasal drip, difficulty swallowing, tooth/dental problems, visual complaints, visual changes, ear aches. Neuro: difficulty with speech, weakness, numbness, ataxia. CV:  chest pain, orthopnea, PND, swelling in lower extremities, dizziness, palpitations, syncope.  Resp: cough, hemoptysis, dyspnea, wheezing. GI  heartburn, indigestion, abdominal pain, nausea, vomiting, diarrhea, constipation, change in bowel habits, loss of appetite, hematemesis, melena, hematochezia.  GU: dysuria, change in color of urine, urgency or frequency, flank pain, hematuria. MSK: joint pain or swelling, decreased range of motion. Psych: change in mood or affect, depression, anxiety, suicidal ideations, homicidal ideations. Skin: rash, itching, bruising.   SUBJECTIVE:   VITAL  SIGNS: Temp:  [93.7 F (34.3 C)-95.3 F (35.2 C)] 95.3 F (35.2 C) (01/05 1400) Pulse Rate:  [50-74] 63 (01/05 1430) Resp:  [13-22] 15 (01/05 1430) BP: (99-127)/(40-63) 105/63 mmHg (01/05 1430) SpO2:  [88 %-94 %] 94 % (01/05 1430) FiO2 (%):  [40 %] 40 % (01/05 1342) HEMODYNAMICS:   VENTILATOR SETTINGS: Vent Mode:  [-] BIPAP FiO2 (%):  [40 %] 40 % Set Rate:  [40 bmp] 40 bmp INTAKE / OUTPUT: Intake/Output    None     PHYSICAL EXAMINATION: General: WDWN female, in NAD. Neuro: A&O x 3, non-focal.  HEENT: [place]/AT. PERRL, sclerae anicteric.  Bright red blood noted in b/l nares but primarily right side, dried blood noted on tongue and in oropharynx. Cardiovascular: RRR, no M/R/G.  Lungs: Respirations even and unlabored. Dimionished BS throughout. No adventitious sounds Abdomen: BS x 4, soft, NT/ND.  Ext: Chronic stasis changes, trace b/l edema, multiple superficial ulcers on LLE.  Skin: Intact, warm, no rashes.  LABS:  CBC  Recent Labs Lab Dec 05, 2014 0921 Dec 05, 2014 1119  WBC 5.7  --   HGB 10.0* 9.9*  HCT 29.9* 29.0*  PLT 192  --    Coag's  Recent Labs Lab 12-05-14 0921  INR 7.64*   BMET  Recent Labs Lab 12/07/2014 0921 12/06/2014 1119  NA 137 137  K 6.9* 7.0*  CL 109 110  CO2 21  --   BUN 105* 115*  CREATININE 4.44* 3.90*  GLUCOSE 131* 120*   Electrolytes  Recent Labs Lab 12/08/2014 0921  CALCIUM 8.5   Sepsis Markers  Recent Labs Lab 12/09/2014 1242  LATICACIDVEN 0.8   ABG No results for input(s): PHART, PCO2ART, PO2ART in the last 168 hours. Liver Enzymes No results for input(s): AST, ALT, ALKPHOS, BILITOT, ALBUMIN in the last 168 hours. Cardiac Enzymes No results for input(s): TROPONINI, PROBNP in the last 168 hours. Glucose No results for input(s): GLUCAP in the last 168 hours.  Imaging Ct Head Wo Contrast  11/26/2014   CLINICAL DATA:  80 year old female with altered mental status and shortness of Breath. Epistaxis, on Coumadin. Initial  encounter.  EXAM: CT HEAD WITHOUT CONTRAST  TECHNIQUE: Contiguous axial images were obtained from the base of the skull through the vertex without intravenous contrast.  COMPARISON:  None.  FINDINGS: Study is intermittently degraded by motion artifact despite repeated imaging attempts.  Opacifications/debris in the right nasal cavity and to a lesser extent right nasopharynx. Visualized paranasal sinuses and mastoids are clear.  Visualized orbits and scalp soft tissues are within normal limits.  No acute osseous abnormality identified.  Calcified atherosclerosis at the skull base. Cerebral volume is within normal limits for age. Patchy mostly periventricular cerebral white matter hypodensity. No midline shift, mass effect, or evidence of intracranial mass lesion. No ventriculomegaly. No suspicious intracranial vascular hyperdensity. No evidence of cortically based acute infarction identified.  IMPRESSION: 1. No acute intracranial abnormality. Mild to moderate for age nonspecific white matter changes. 2. Opacification of the right nasal cavity and nasopharynx may be related to epistaxis. Negative paranasal sinuses.   Electronically Signed   By: Augusto GambleLee  Hall M.D.   On: 11/12/2014 10:26   Dg Chest Portable 1 View  11/21/2014   CLINICAL DATA:  80 year old female with shortness of breath generalized weakness. On Coumadin, epistaxis. Initial encounter.  EXAM: PORTABLE CHEST - 1 VIEW  COMPARISON:  Chest CT 12/16/2013 and earlier.  FINDINGS: Portable AP upright view at 0927 hrs. Chronic lung base hypo ventilation. A combination of pleural fluid and consolidation was present at the lung bases on the 12/16/2013 comparison. Ventilation today is mildly improved compared to 12/15/2013. Stable cardiac size and mediastinal contours. No pneumothorax. No large pleural effusion. Pulmonary vascular congestion appears increased. Stable visualized osseous structures.  IMPRESSION: Chronically poor ventilation at both lung bases over this  series of exams. Currently suspect pulmonary vascular congestion/ mild interstitial edema with severe bibasilar atelectasis. Lung base pneumonia is difficult to exclude.   Electronically Signed   By: Augusto GambleLee  Hall M.D.   On: 11/20/2014 09:50    ASSESSMENT / PLAN:  PULMONARY A: Acute hypoxic respiratory failure Moderate COPD with chronic O2 dependence Probable pulmonary edema Pulmonary nodule - 7mm RLL seen on chest CT Feb 2015. Atelectasis P:   Continue supplemental O2 to maintain SpO2 > 92%. No BiPAP given epistaxis DuoNebs scheduled. Incentive spirometry. CXR in AM. Hold outpatient advair, spiriva.  CARDIOVASCULAR A:  CAF > NSR presently Risk of hyperkalemia induced arrhythmias Chronic diastolic heart failure Hx HTN, HLD P:  STAT EKG > no conduction delays noted Holding outpatient lasix, losartan, lopressor, nifedipine, rosuvastatin for now.  RENAL A:   AKI, nonoliguric Severe hyperkalemia Chronic edema Risk of volume overload P:   Monitor BMET intermittently Monitor I/Os Correct electrolytes as indicated  Now that BP adequate and with order for FFP, will DC IVFs Lasix X 1 after first two bags of FFP  GASTROINTESTINAL A:   NO acute issues P:   SUP: IV PPI NPO for now  HEMATOLOGIC A:   Warfarin induced coagulopathy Acute blood loss anemia Epistaxis VTE Prophylaxis P:  DVT px: SQ heparin Monitor CBC intermittently Transfuse RBCs for Hgb < 7.0 or hemorrhagic shock - not indicated presently Correct coagulopathy with FFP, Vitamin K Probably a very poor candidate for long term warfarin  INFECTIOUS A:   Chronic venous stasis ulcers LLE Pulmonary infiltrates - doubt PNA P:   Blood Cx 01/05 >>> Sputum Cx 01/05 >>> Follow off abx for now WOC consult requested  ENDOCRINE A:   Hyperglycemia without documented hx of DM P:   Consider SSI if glucose > 180.  NEUROLOGIC A:   No acute issues P:   No interventions required. Holding outpatient gabapentin,  norco.   Family updated: None.  Interdisciplinary Family Meeting v Palliative Care Meeting:  Due by: 01/12.   Rutherford Guys, [place] - C  Pulmonary & Critical Care Medicine Pgr: [phone]  or [phone] 11/10/2014, 3:02 PM

## 2014-11-14 ENCOUNTER — Inpatient Hospital Stay (HOSPITAL_COMMUNITY): Payer: Medicare FFS

## 2014-11-14 DIAGNOSIS — N179 Acute kidney failure, unspecified: Secondary | ICD-10-CM

## 2014-11-14 DIAGNOSIS — D62 Acute posthemorrhagic anemia: Secondary | ICD-10-CM

## 2014-11-14 DIAGNOSIS — D689 Coagulation defect, unspecified: Secondary | ICD-10-CM

## 2014-11-14 DIAGNOSIS — E875 Hyperkalemia: Secondary | ICD-10-CM

## 2014-11-14 LAB — BASIC METABOLIC PANEL
Anion gap: 11 (ref 5–15)
BUN: 105 mg/dL — ABNORMAL HIGH (ref 6–23)
CO2: 22 mmol/L (ref 19–32)
Calcium: 9.3 mg/dL (ref 8.4–10.5)
Chloride: 111 mEq/L (ref 96–112)
Creatinine, Ser: 3.89 mg/dL — ABNORMAL HIGH (ref 0.50–1.10)
GFR calc Af Amer: 12 mL/min — ABNORMAL LOW (ref >90)
GFR calc non Af Amer: 10 mL/min — ABNORMAL LOW (ref >90)
GLUCOSE: 91 mg/dL (ref 70–99)
POTASSIUM: 5 mmol/L (ref 3.5–5.1)
Sodium: 144 mmol/L (ref 135–145)

## 2014-11-14 LAB — CBC
HCT: 26 % — ABNORMAL LOW (ref 36.0–46.0)
HEMOGLOBIN: 8.2 g/dL — AB (ref 12.0–15.0)
MCH: 30.9 pg (ref 26.0–34.0)
MCHC: 31.5 g/dL (ref 30.0–36.0)
MCV: 98.1 fL (ref 78.0–100.0)
PLATELETS: 172 10*3/uL (ref 150–400)
RBC: 2.65 MIL/uL — ABNORMAL LOW (ref 3.87–5.11)
RDW: 15.7 % — ABNORMAL HIGH (ref 11.5–15.5)
WBC: 8.1 10*3/uL (ref 4.0–10.5)

## 2014-11-14 LAB — TSH: TSH: 0.731 u[IU]/mL (ref 0.350–4.500)

## 2014-11-14 LAB — TROPONIN I
Troponin I: 0.04 ng/mL — ABNORMAL HIGH (ref <0.031)
Troponin I: 0.17 ng/mL — ABNORMAL HIGH (ref <0.031)

## 2014-11-14 LAB — MAGNESIUM: Magnesium: 2.7 mg/dL — ABNORMAL HIGH (ref 1.5–2.5)

## 2014-11-14 LAB — PROTIME-INR
INR: 2.08 — AB (ref 0.00–1.49)
Prothrombin Time: 23.5 seconds — ABNORMAL HIGH (ref 11.6–15.2)

## 2014-11-14 LAB — PHOSPHORUS: Phosphorus: 6.1 mg/dL — ABNORMAL HIGH (ref 2.3–4.6)

## 2014-11-14 MED ORDER — OXYMETAZOLINE HCL 0.05 % NA SOLN
2.0000 | Freq: Two times a day (BID) | NASAL | Status: AC
  Administered 2014-11-14 – 2014-11-15 (×4): 2 via NASAL
  Filled 2014-11-14: qty 15

## 2014-11-14 MED ORDER — AYR SALINE NASAL NA GEL: 1.0000 "application " | Freq: Two times a day (BID) | NASAL | Status: DC

## 2014-11-14 MED ORDER — SILVER NITRATE-POT NITRATE 75-25 % EX MISC
1.0000 | Freq: Once | CUTANEOUS | Status: DC
  Filled 2014-11-14: qty 1

## 2014-11-14 MED ORDER — GABAPENTIN 100 MG PO CAPS
100.0000 mg | ORAL_CAPSULE | Freq: Two times a day (BID) | ORAL | Status: DC
  Administered 2014-11-14 – 2014-11-15 (×4): 100 mg via ORAL
  Filled 2014-11-14 (×6): qty 1

## 2014-11-14 MED ORDER — GABAPENTIN 300 MG PO CAPS: 300.0000 mg | ORAL_CAPSULE | Freq: Two times a day (BID) | ORAL | Status: DC

## 2014-11-14 MED ORDER — SALINE SPRAY 0.65 % NA SOLN
2.0000 | NASAL | Status: DC | PRN
  Filled 2014-11-14: qty 44

## 2014-11-14 MED ORDER — FUROSEMIDE 10 MG/ML IJ SOLN
40.0000 mg | Freq: Once | INTRAMUSCULAR | Status: AC
  Administered 2014-11-14: 40 mg via INTRAVENOUS

## 2014-11-14 MED ORDER — FUROSEMIDE 10 MG/ML IJ SOLN
INTRAMUSCULAR | Status: AC
  Filled 2014-11-14: qty 4

## 2014-11-14 MED ORDER — WHITE PETROLATUM GEL
Status: AC
  Administered 2014-11-14: 0.2
  Filled 2014-11-14: qty 1

## 2014-11-14 MED ORDER — COCAINE HCL 4 % EX SOLN
4.0000 mL | Freq: Once | CUTANEOUS | Status: DC
  Filled 2014-11-14: qty 4

## 2014-11-14 MED ORDER — SODIUM CHLORIDE 0.9 % IV SOLN: Freq: Once | INTRAVENOUS | Status: DC

## 2014-11-14 MED ORDER — BACITRACIN ZINC 500 UNIT/GM EX OINT
TOPICAL_OINTMENT | Freq: Two times a day (BID) | CUTANEOUS | Status: DC
  Administered 2014-11-14 – 2014-11-21 (×13): via TOPICAL
  Administered 2014-11-21: 1 via TOPICAL
  Administered 2014-11-22 (×2): via TOPICAL
  Administered 2014-11-23: 1 via TOPICAL
  Administered 2014-11-23: 10:00:00 via TOPICAL
  Administered 2014-11-24 (×2): 1 via TOPICAL
  Filled 2014-11-14 (×2): qty 28.35

## 2014-11-14 NOTE — Progress Notes (Signed)
  Recent Labs Lab 11/20/2014 0921 12/09/2014 1119 11/23/2014 1939 11/14/14 0935  HGB 10.0* 9.9* 9.0* 8.2*  HCT 29.9* 29.0* 28.0* 26.0*  WBC 5.7  --   --  8.1  PLT 192  --   --  172     Recent Labs Lab 11/24/2014 0921 11/29/2014 1939 11/14/14 0935  INR 7.64* 8.45* 2.08*    A Inr still hight  PLAN 1 u FFP Restart home gabapentin at low dose  Dr. Kalman ShanMurali Antony Sian, M.D., [facility] Center[facility].C.P Pulmonary and Critical Care Medicine Staff Physician [facility] Pulmonary and Critical Care Pager: [phone], If no answer or between  15:00h - 7:00h: call [phone]  11/14/2014 11:26 AM

## 2014-11-14 NOTE — H&P (Signed)
PULMONARY / CRITICAL CARE MEDICINE   Name: Paula Fletcher MRN: [id number] DOB: 07/23/1935    ADMISSION DATE:  December 02, 2014 CONSULTATION DATE:  11/14/2014  REFERRING MD :  [facility]  CHIEF COMPLAINT:  Nose bleed, generalized weakness, SOB  INITIAL PRESENTATION:   .Paula Fletcher is a 80 y.o. F with PMH as outlined below including 4L O2 dependent COPD and A.fib on chronic coumadin, She presented to [facility] on [facility]/05 for epistaxis, generalized weakness, and SOB.  Epistaxis began roughly one day prior at 1230 and resolved spontaneously prior to re-starting again shortly thereafter.  She reports that bleeding was "like a stream", primarily out of the right nare.  Also believes that some blood went down her throat. In ED, she was found to have temp of 94 rectally, supratherapeutic INR at 7.64, anemia with Hgb 10, acute renal failure with SCr 3.90, hyperkalemia with K of 7.  In addition, she was started on BiPAP which was later changed to [facility].  Request was made to transfer to [facility] ICU for concerns that pt may deteriorate.  PCCM consulted for admission.  On arrival to [facility], pt noted to still be slowly bleeding from b/l nares as well as new bleeding from foley site.     STUDIES:  01/05 CT Head: no acute abnormality, opacification of right nasal cavity and nasopharynx, may be related to epistaxis. 01/05 - admitted to [facility] ICU  SUBJECTIVE:   11/14/14: Still with intermittent very mild slow epistaxis. Hungry . Making urine. All labs today pending  VITAL SIGNS: Temp:  [93.7 F (34.3 C)-98.2 F (36.8 C)] 97.8 F (36.6 C) (01/06 0830) Pulse Rate:  [50-96] 96 (01/06 0900) Resp:  [13-26] 18 (01/06 0900) BP: (99-156)/(32-101) 141/57 mmHg (01/06 0900) SpO2:  [82 %-98 %] 96 % (01/06 0900) FiO2 (%):  [40 %-55 %] 50 % (01/06 0800) HEMODYNAMICS:   VENTILATOR SETTINGS: Vent Mode:  [-] BIPAP FiO2 (%):  [40 %-55 %] 50 % Set Rate:  [40 bmp] 40 bmp INTAKE / OUTPUT: Intake/Output      01/05 0701 - 01/06 0700  01/06 0701 - 01/07 0700   I.V. 90 8   Blood 870 231   Total Intake 960 239   Urine 1150    Total Output 1150     Net -190 +239        Urine Occurrence 2 x 1 x     PHYSICAL EXAMINATION: General: WDWN female, in NAD. Neuro: A&O x 3, non-focal.  HEENT: [place]/AT. PERRL, sclerae anicteric.  Bright red blood noted in b/l nares but primarily right side, dried blood noted on tongue and in oropharynx. Cardiovascular: RRR, no M/R/G.  Lungs: Respirations even and unlabored. Dimionished BS throughout. No adventitious sounds Abdomen: BS x 4, soft, NT/ND.  Ext: Chronic stasis changes, trace b/l edema, multiple superficial ulcers on LLE.  Skin: Intact, warm, no rashes.  LABS: PULMONARY  Recent Labs Lab 02-Dec-2014 1119  TCO2 17    CBC  Recent Labs Lab Dec 02, 2014 0921 12/02/14 1119 2014-12-02 1939  HGB 10.0* 9.9* 9.0*  HCT 29.9* 29.0* 28.0*  WBC 5.7  --   --   PLT 192  --   --     COAGULATION  Recent Labs Lab 2014-12-02 0921 12/02/14 1939  INR 7.64* 8.45*    CARDIAC  No results for input(s): TROPONINI in the last 168 hours. No results for input(s): PROBNP in the last 168 hours.   CHEMISTRY  Recent Labs Lab 02-Dec-2014 [phone] 12/02/14 1119 12/02/14 2020  NA 137 137 140  K 6.9* 7.0* 6.6*  CL 109 110 112  CO2 21  --  18*  GLUCOSE 131* 120* 94  BUN 105* 115* 108*  CREATININE 4.44* 3.90* 4.20*  CALCIUM 8.5  --  8.8   CrCl cannot be calculated (Unknown ideal weight.).   LIVER  Recent Labs Lab 12/06/14 0921 Dec 06, 2014 1939 December 06, 2014 2020  AST  --   --  19  ALT  --   --  20  ALKPHOS  --   --  62  BILITOT  --   --  0.1*  PROT  --   --  5.6*  ALBUMIN  --   --  3.2*  INR 7.64* 8.45*  --      INFECTIOUS  Recent Labs Lab 12/06/14 1242  LATICACIDVEN 0.8     ENDOCRINE CBG (last 3)  No results for input(s): GLUCAP in the last 72 hours.       IMAGING x48h Ct Head Wo Contrast  12/06/2014   CLINICAL DATA:  80 year old female with altered mental status and  shortness of Breath. Epistaxis, on Coumadin. Initial encounter.  EXAM: CT HEAD WITHOUT CONTRAST  TECHNIQUE: Contiguous axial images were obtained from the base of the skull through the vertex without intravenous contrast.  COMPARISON:  None.  FINDINGS: Study is intermittently degraded by motion artifact despite repeated imaging attempts.  Opacifications/debris in the right nasal cavity and to a lesser extent right nasopharynx. Visualized paranasal sinuses and mastoids are clear.  Visualized orbits and scalp soft tissues are within normal limits.  No acute osseous abnormality identified.  Calcified atherosclerosis at the skull base. Cerebral volume is within normal limits for age. Patchy mostly periventricular cerebral white matter hypodensity. No midline shift, mass effect, or evidence of intracranial mass lesion. No ventriculomegaly. No suspicious intracranial vascular hyperdensity. No evidence of cortically based acute infarction identified.  IMPRESSION: 1. No acute intracranial abnormality. Mild to moderate for age nonspecific white matter changes. 2. Opacification of the right nasal cavity and nasopharynx may be related to epistaxis. Negative paranasal sinuses.   Electronically Signed   By: Augusto Gamble M.D.   On: Dec 06, 2014 10:26   Dg Chest Port 1 View  11/14/2014   CLINICAL DATA:  Respiratory failure, community acquired pneumonia, COPD, atrial fibrillation and mitral stenosis  EXAM: PORTABLE CHEST - 1 VIEW  COMPARISON:  Portable chest x-ray of 2014/12/06  FINDINGS: The lungs are slightly better inflated today. The interstitial markings remain increased. There is patchy bibasilar atelectasis which has improved. The left hemidiaphragm is better visualized today. There is no significant pleural effusion. The cardiac silhouette remains enlarged. The pulmonary vascularity remains engorged but is less conspicuous today. The mediastinum is normal in width.  IMPRESSION: Slight interval improvement in the  appearance of the chest since yesterday's study. There is congestive heart failure with pulmonary interstitial edema superimposed upon COPD. Bibasilar atelectasis or pneumonia may coexist.   Electronically Signed   By: David  [place]   On: 11/14/2014 08:06   Dg Chest Portable 1 View  2014-12-06   CLINICAL DATA:  80 year old female with shortness of breath generalized weakness. On Coumadin, epistaxis. Initial encounter.  EXAM: PORTABLE CHEST - 1 VIEW  COMPARISON:  Chest CT 12/16/2013 and earlier.  FINDINGS: Portable AP upright view at 0927 hrs. Chronic lung base hypo ventilation. A combination of pleural fluid and consolidation was present at the lung bases on the 12/16/2013 comparison. Ventilation today is mildly improved compared to 12/15/2013. Stable cardiac  size and mediastinal contours. No pneumothorax. No large pleural effusion. Pulmonary vascular congestion appears increased. Stable visualized osseous structures.  IMPRESSION: Chronically poor ventilation at both lung bases over this series of exams. Currently suspect pulmonary vascular congestion/ mild interstitial edema with severe bibasilar atelectasis. Lung base pneumonia is difficult to exclude.   Electronically Signed   By: Augusto GambleLee  Hall M.D.   On: 2015/04/04 09:50        ASSESSMENT / PLAN:  PULMONARY A: #Baseline  - Moderate COPD with chronic O2 dependence  - Pulmonary nodule - 7mm RLL seen on chest CT Feb 2015.  #Currently   - Mild Acute hypoxic respiratory failure due to Probable pulmonary edema/Atlectasis in setting of ATN and epistaxis    - 11/14/14: having mild intermittent epistaxis ongoing   P:   [facility] Continue supplemental O2 to maintain SpO2 > 92%. Ut NO NASAL CANNULA No BiPAP given epistaxis DuoNebs scheduled. Incentive spirometry. CXR in AM. Hold outpatient advair, spiriva.  CARDIOVASCULAR A:  CAF > NSR presently Risk of hyperkalemia induced arrhythmias Chronic diastolic heart  failure Hx HTN, HLD   - currently 11/14/14 ? Mild volume overload  P:  Holding outpatient lasix, losartan, lopressor, nifedipine, rosuvastatin for now. Based on creatinine today, will try empiric lasix Check BNP tomorrow to decide on scheduled lasix   RENAL A:   AKI, nonoliguric Severe hyperkalemia Chronic edema Risk of volume overload   - awaiting creat 11/14/14 but making urine  P:   Based on creat today, lasix v renal consult Monitor BMET intermittently Monitor I/Os Correct electrolytes as indicated Now that BP adequate and with order for FFP, will DC IVFs   GASTROINTESTINAL A:   NO acute issues P:   SUP: IV PPI NPO for now till ENT consult complete  HEMATOLOGIC A:   Warfarin induced coagulopathy Acute blood loss anemia Epistaxis VTE Prophylaxis   - s/p FFP complete 11/27/2014. INR pending 11/14/14  P:  DVT px: SCD Monitor CBC intermittently Transfuse RBCs for Hgb < 7.0 or hemorrhagic shock - not indicated presently Correct coagulopathy with FFP, Vitamin K Probably a very poor candidate for long term warfarin  INFECTIOUS A:   Chronic venous stasis ulcers LLE Pulmonary infiltrates - doubt PNA P:   Blood Cx 01/05 >>> Sputum Cx 01/05 >>> Follow off abx for now WOC consult requested  ENDOCRINE A:   Hyperglycemia without documented hx of DM P:   Consider SSI if glucose > 180.  NEUROLOGIC A:   No acute issues P:   No interventions required. Holding outpatient gabapentin, norco.   Family updated: Patient updated 11/14/14  Interdisciplinary Family Meeting v Palliative Care Meeting:  Due by: 01/12.     Dr. Kalman ShanMurali Adline Kirshenbaum, M.D., [facility] IncF.C.C.P Pulmonary and Critical Care Medicine Staff Physician [facility] [place] Pulmonary and Critical Care Pager: [phone], If no answer or between  15:00h - 7:00h: call [phone]  11/14/2014 10:03 AM

## 2014-11-14 NOTE — Consult Note (Signed)
Paula Fletcher, Paula Fletcher 80 y.o., female [id number]     Chief Complaint: epistaxis  HPI: 80 yo wf, admitted yest with T 94, K+ 7, INR 7.6 with profuse epistaxis.  On Coumadin for Afib.  INR corrected with Vit K, FFP.  Today 2.08.  Still some oozing today from RIGHT side.  Pt wears nasal cannula O2 at home with no humidification.    PMH: Past Medical History  Diagnosis Date  . COPD (chronic obstructive pulmonary disease)   . Essential hypertension, benign   . Hyperlipidemia   . Gout   . Pleural effusion, bilateral     Noted February 2015 in the setting of pneumonia and diastolic heart failure  . CAP (community acquired pneumonia)   . Chronic diastolic heart failure     LVEF 62-13%, grade 1 diastolic dysfunction  . Atrial fibrillation     Diagnosed February 2015  . Lung nodule     7 mm right lower lobe pulmonary nodule - chest CT February 2015  . Mitral stenosis     Mild to moderate    Surg Hx: Past Surgical History  Procedure Laterality Date  . Cholecystectomy    . Appendectomy    . Abdominal hysterectomy      FHx:   Family History  Problem Relation Age of Onset  . Heart failure Mother    SocHx:  reports that she has quit smoking. Her smoking use included Cigarettes. She smoked 0.00 packs per day. She has quit using smokeless tobacco. She reports that she does not drink alcohol or use illicit drugs.  ALLERGIES: No Known Allergies  Medications Prior to Admission  Medication Sig Dispense Refill  . allopurinol (ZYLOPRIM) 300 MG tablet Take 150 mg by mouth daily.     . cephALEXin (KEFLEX) 500 MG capsule Take 500 mg by mouth 3 (three) times daily.    . Fluticasone-Salmeterol (ADVAIR) 250-50 MCG/DOSE AEPB Inhale 1 puff into the lungs 2 (two) times daily.    . furosemide (LASIX) 40 MG tablet Take 40-80 mg by mouth 3 (three) times daily. 40 mg in the morning, 80 mg at lunch, and 40 mg in the evenings    . gabapentin (NEURONTIN) 300 MG capsule Take 300 mg by mouth 2 (two) times daily.      Marland Kitchen HYDROcodone-acetaminophen (NORCO/VICODIN) 5-325 MG per tablet Take 0.5 tablets by mouth every 4 (four) hours as needed for moderate pain.     Marland Kitchen losartan (COZAAR) 100 MG tablet Take 0.5 tablets (50 mg total) by mouth daily.    . metoprolol (LOPRESSOR) 50 MG tablet Take 1 tablet (50 mg total) by mouth 2 (two) times daily. Take 1 tablet in the morning and 1 tablet in the evening.    Marland Kitchen NIFEdipine (PROCARDIA-XL/ADALAT-CC/NIFEDICAL-XL) 30 MG 24 hr tablet Take 30 mg by mouth daily.    . rosuvastatin (CRESTOR) 40 MG tablet Take 40 mg by mouth at bedtime.     . sulfamethoxazole-trimethoprim (BACTRIM DS,SEPTRA DS) 800-160 MG per tablet Take 1 tablet by mouth 2 (two) times daily.    Marland Kitchen tiotropium (SPIRIVA) 18 MCG inhalation capsule Place 18 mcg into inhaler and inhale daily.    Marland Kitchen warfarin (COUMADIN) 5 MG tablet Take 2.5-5 mg by mouth daily at 6 PM.    . albuterol (PROVENTIL HFA;VENTOLIN HFA) 108 (90 BASE) MCG/ACT inhaler Inhale 2 puffs into the lungs 2 (two) times daily. (Patient not taking: Reported on 11/29/2014) 1 Inhaler 2  . predniSONE (DELTASONE) 20 MG tablet Take 1 tablet (20 mg  total) by mouth daily as needed (For gout pain). (Patient not taking: Reported on 12/04/2014) 5 tablet 0  . warfarin (COUMADIN) 5 MG tablet Take 1.5 tablets (7.5 mg total) by mouth daily. Take 1 and a 1/2 tablets each evening. Further dosing will be given to you by the Coumadin clinic. (Patient not taking: Reported on 12/05/2014) 45 tablet 2    Results for orders placed or performed during the hospital encounter of 12/05/2014 (from the past 48 hour(s))  CBC with Differential     Status: Abnormal   Collection Time: 11/24/2014  9:21 AM  Result Value Ref Range   WBC 5.7 4.0 - 10.5 K/uL   RBC 3.02 (L) 3.87 - 5.11 MIL/uL   Hemoglobin 10.0 (L) 12.0 - 15.0 g/dL   HCT 29.9 (L) 36.0 - 46.0 %   MCV 99.0 78.0 - 100.0 fL   MCH 33.1 26.0 - 34.0 pg   MCHC 33.4 30.0 - 36.0 g/dL   RDW 15.5 11.5 - 15.5 %   Platelets 192 150 - 400 K/uL    Neutrophils Relative % 88 (H) 43 - 77 %   Neutro Abs 5.0 1.7 - 7.7 K/uL   Lymphocytes Relative 9 (L) 12 - 46 %   Lymphs Abs 0.5 (L) 0.7 - 4.0 K/uL   Monocytes Relative 3 3 - 12 %   Monocytes Absolute 0.1 0.1 - 1.0 K/uL   Eosinophils Relative 0 0 - 5 %   Eosinophils Absolute 0.0 0.0 - 0.7 K/uL   Basophils Relative 0 0 - 1 %   Basophils Absolute 0.0 0.0 - 0.1 K/uL  Basic metabolic panel     Status: Abnormal   Collection Time: 11/23/2014  9:21 AM  Result Value Ref Range   Sodium 137 135 - 145 mmol/L    Comment: Please note change in reference range.   Potassium 6.9 (HH) 3.5 - 5.1 mmol/L    Comment: CRITICAL RESULT CALLED TO, READ BACK BY AND VERIFIED WITH: OSBORNE,T AT 10:05AM ON 11/14/2014 BY FESTERMAN,C Please note change in reference range.    Chloride 109 96 - 112 mEq/L   CO2 21 19 - 32 mmol/L   Glucose, Bld 131 (H) 70 - 99 mg/dL   BUN 105 (H) 6 - 23 mg/dL   Creatinine, Ser 4.44 (H) 0.50 - 1.10 mg/dL   Calcium 8.5 8.4 - 10.5 mg/dL   GFR calc non Af Amer 9 (L) >90 mL/min   GFR calc Af Amer 10 (L) >90 mL/min    Comment: (NOTE) The eGFR has been calculated using the CKD EPI equation. This calculation has not been validated in all clinical situations. eGFR's persistently <90 mL/min signify possible Chronic Kidney Disease.    Anion gap 7 5 - 15  Protime-INR     Status: Abnormal   Collection Time: 11/21/2014  9:21 AM  Result Value Ref Range   Prothrombin Time 65.2 (H) 11.6 - 15.2 seconds    Comment: RESULT REPEATED AND VERIFIED   INR 7.64 (HH) 0.00 - 1.49    Comment: RESULT REPEATED AND VERIFIED CRITICAL RESULT CALLED TO, READ BACK BY AND VERIFIED WITH: REBECCA MINTER RN ON [id number] AT 1050 BY RESSEGGER R   Urinalysis, Routine w reflex microscopic     Status: None   Collection Time: 11/27/2014  9:53 AM  Result Value Ref Range   Color, Urine YELLOW YELLOW   APPearance CLEAR CLEAR   Specific Gravity, Urine 1.025 1.005 - 1.030   pH 6.0 5.0 - 8.0  Glucose, UA NEGATIVE NEGATIVE mg/dL    Hgb urine dipstick NEGATIVE NEGATIVE   Bilirubin Urine NEGATIVE NEGATIVE   Ketones, ur NEGATIVE NEGATIVE mg/dL   Protein, ur NEGATIVE NEGATIVE mg/dL   Urobilinogen, UA 0.2 0.0 - 1.0 mg/dL   Nitrite NEGATIVE NEGATIVE   Leukocytes, UA NEGATIVE NEGATIVE    Comment: MICROSCOPIC NOT DONE ON URINES WITH NEGATIVE PROTEIN, BLOOD, LEUKOCYTES, NITRITE, OR GLUCOSE <1000 mg/dL.  Culture, blood (single)     Status: None (Preliminary result)   Collection Time: 11/10/2014 11:13 AM  Result Value Ref Range   Specimen Description BLOOD RIGHT ANTECUBITAL    Special Requests BOTTLES DRAWN AEROBIC AND ANAEROBIC 6CC    Culture NO GROWTH 1 DAY    Report Status PENDING   I-Stat Chem 8, ED     Status: Abnormal   Collection Time: 11/15/2014 11:19 AM  Result Value Ref Range   Sodium 137 135 - 145 mmol/L   Potassium 7.0 (HH) 3.5 - 5.1 mmol/L   Chloride 110 96 - 112 mEq/L   BUN 115 (H) 6 - 23 mg/dL   Creatinine, Ser 3.90 (H) 0.50 - 1.10 mg/dL   Glucose, Bld 120 (H) 70 - 99 mg/dL   Calcium, Ion 1.23 1.13 - 1.30 mmol/L   TCO2 17 0 - 100 mmol/L   Hemoglobin 9.9 (L) 12.0 - 15.0 g/dL   HCT 29.0 (L) 36.0 - 46.0 %  Lactic acid, plasma     Status: None   Collection Time: 12/07/2014 12:42 PM  Result Value Ref Range   Lactic Acid, Venous 0.8 0.5 - 2.2 mmol/L  Culture, blood (single)     Status: None (Preliminary result)   Collection Time: 11/22/2014  1:10 PM  Result Value Ref Range   Specimen Description BLOOD RIGHT ANTECUBITAL    Special Requests BOTTLES DRAWN AEROBIC ONLY 4CC  IMMUNE:COMPROMISED    Culture NO GROWTH 1 DAY    Report Status PENDING   Hemoglobin and hematocrit, blood     Status: Abnormal   Collection Time: 11/09/2014  7:39 PM  Result Value Ref Range   Hemoglobin 9.0 (L) 12.0 - 15.0 g/dL   HCT 28.0 (L) 36.0 - 46.0 %  Protime-INR     Status: Abnormal   Collection Time: 12/07/2014  7:39 PM  Result Value Ref Range   Prothrombin Time 70.5 (H) 11.6 - 15.2 seconds    Comment: REPEATED TO VERIFY   INR 8.45  (HH) 0.00 - 1.49    Comment: REPEATED TO VERIFY CRITICAL RESULT CALLED TO, READ BACK BY AND VERIFIED WITH: J JONES,RN 2014 12/06/2014 D BRADLEY   Comprehensive metabolic panel     Status: Abnormal   Collection Time: 11/09/2014  8:20 PM  Result Value Ref Range   Sodium 140 135 - 145 mmol/L    Comment: Please note change in reference range.   Potassium 6.6 (HH) 3.5 - 5.1 mmol/L    Comment: Please note change in reference range. REPEATED TO VERIFY NO VISIBLE HEMOLYSIS CRITICAL RESULT CALLED TO, READ BACK BY AND VERIFIED WITH: J [facility] 2118 12/02/2014 WBOND    Chloride 112 96 - 112 mEq/L   CO2 18 (L) 19 - 32 mmol/L   Glucose, Bld 94 70 - 99 mg/dL   BUN 108 (H) 6 - 23 mg/dL   Creatinine, Ser 4.20 (H) 0.50 - 1.10 mg/dL   Calcium 8.8 8.4 - 10.5 mg/dL   Total Protein 5.6 (L) 6.0 - 8.3 g/dL   Albumin 3.2 (L) 3.5 - 5.2 g/dL  AST 19 0 - 37 U/L   ALT 20 0 - 35 U/L   Alkaline Phosphatase 62 39 - 117 U/L   Total Bilirubin 0.1 (L) 0.3 - 1.2 mg/dL   GFR calc non Af Amer 9 (L) >90 mL/min   GFR calc Af Amer 11 (L) >90 mL/min    Comment: (NOTE) The eGFR has been calculated using the CKD EPI equation. This calculation has not been validated in all clinical situations. eGFR's persistently <90 mL/min signify possible Chronic Kidney Disease.    Anion gap 10 5 - 15  Prepare fresh frozen plasma     Status: None (Preliminary result)   Collection Time: 12/05/2014  9:00 PM  Result Value Ref Range   Unit Number [id number]    Blood Component Type THAWED PLASMA    Unit division 00    Status of Unit ISSUED    Transfusion Status OK TO TRANSFUSE    Unit Number [id number]    Blood Component Type THAWED PLASMA    Unit division 00    Status of Unit ISSUED,FINAL    Transfusion Status OK TO TRANSFUSE    Unit Number [id number]    Blood Component Type THAWED PLASMA    Unit division 00    Status of Unit ISSUED    Transfusion Status OK TO TRANSFUSE    Unit Number [id number]    Blood Component  Type THAWED PLASMA    Unit division 00    Status of Unit ISSUED    Transfusion Status OK TO TRANSFUSE   Type and screen     Status: None   Collection Time: 12/06/2014  9:35 PM  Result Value Ref Range   ABO/RH(D) AB POS    Antibody Screen NEG    Sample Expiration 11/16/2014   ABO/Rh     Status: None   Collection Time: 11/14/2014  9:35 PM  Result Value Ref Range   ABO/RH(D) AB POS   CBC     Status: Abnormal   Collection Time: 11/14/14  9:35 AM  Result Value Ref Range   WBC 8.1 4.0 - 10.5 K/uL   RBC 2.65 (L) 3.87 - 5.11 MIL/uL   Hemoglobin 8.2 (L) 12.0 - 15.0 g/dL   HCT 26.0 (L) 36.0 - 46.0 %   MCV 98.1 78.0 - 100.0 fL   MCH 30.9 26.0 - 34.0 pg   MCHC 31.5 30.0 - 36.0 g/dL   RDW 15.7 (H) 11.5 - 15.5 %   Platelets 172 150 - 400 K/uL  Protime-INR     Status: Abnormal   Collection Time: 11/14/14  9:35 AM  Result Value Ref Range   Prothrombin Time 23.5 (H) 11.6 - 15.2 seconds   INR 2.08 (H) 0.00 - 1.49  Troponin I     Status: Abnormal   Collection Time: 11/14/14 11:00 AM  Result Value Ref Range   Troponin I 0.04 (H) <0.031 ng/mL    Comment:        PERSISTENTLY INCREASED TROPONIN VALUES IN THE RANGE OF 0.04-0.49 ng/mL CAN BE SEEN IN:       -UNSTABLE ANGINA       -CONGESTIVE HEART FAILURE       -MYOCARDITIS       -CHEST TRAUMA       -ARRYHTHMIAS       -LATE PRESENTING MYOCARDIAL INFARCTION       -COPD   CLINICAL FOLLOW-UP RECOMMENDED. Please note change in reference range.   Prepare fresh frozen plasma     Status:  None (Preliminary result)   Collection Time: 11/14/14 12:00 PM  Result Value Ref Range   Unit Number [id number]    Blood Component Type THW PLS APHR    Unit division 00    Status of Unit ALLOCATED    Transfusion Status OK TO TRANSFUSE    Ct Head Wo Contrast  11/15/2014   CLINICAL DATA:  80 year old female with altered mental status and shortness of Breath. Epistaxis, on Coumadin. Initial encounter.  EXAM: CT HEAD WITHOUT CONTRAST  TECHNIQUE: Contiguous axial  images were obtained from the base of the skull through the vertex without intravenous contrast.  COMPARISON:  None.  FINDINGS: Study is intermittently degraded by motion artifact despite repeated imaging attempts.  Opacifications/debris in the right nasal cavity and to a lesser extent right nasopharynx. Visualized paranasal sinuses and mastoids are clear.  Visualized orbits and scalp soft tissues are within normal limits.  No acute osseous abnormality identified.  Calcified atherosclerosis at the skull base. Cerebral volume is within normal limits for age. Patchy mostly periventricular cerebral white matter hypodensity. No midline shift, mass effect, or evidence of intracranial mass lesion. No ventriculomegaly. No suspicious intracranial vascular hyperdensity. No evidence of cortically based acute infarction identified.  IMPRESSION: 1. No acute intracranial abnormality. Mild to moderate for age nonspecific white matter changes. 2. Opacification of the right nasal cavity and nasopharynx may be related to epistaxis. Negative paranasal sinuses.   Electronically Signed   By: Lars Pinks M.D.   On: 11/24/2014 10:26   Dg Chest Port 1 View  11/14/2014   CLINICAL DATA:  Respiratory failure, community acquired pneumonia, COPD, atrial fibrillation and mitral stenosis  EXAM: PORTABLE CHEST - 1 VIEW  COMPARISON:  Portable chest x-ray of November 13, 2014  FINDINGS: The lungs are slightly better inflated today. The interstitial markings remain increased. There is patchy bibasilar atelectasis which has improved. The left hemidiaphragm is better visualized today. There is no significant pleural effusion. The cardiac silhouette remains enlarged. The pulmonary vascularity remains engorged but is less conspicuous today. The mediastinum is normal in width.  IMPRESSION: Slight interval improvement in the appearance of the chest since yesterday's study. There is congestive heart failure with pulmonary interstitial edema superimposed upon  COPD. Bibasilar atelectasis or pneumonia may coexist.   Electronically Signed   By: David  Martinique   On: 11/14/2014 08:06   Dg Chest Portable 1 View  11/20/2014   CLINICAL DATA:  80 year old female with shortness of breath generalized weakness. On Coumadin, epistaxis. Initial encounter.  EXAM: PORTABLE CHEST - 1 VIEW  COMPARISON:  Chest CT 12/16/2013 and earlier.  FINDINGS: Portable AP upright view at 0927 hrs. Chronic lung base hypo ventilation. A combination of pleural fluid and consolidation was present at the lung bases on the 12/16/2013 comparison. Ventilation today is mildly improved compared to 12/15/2013. Stable cardiac size and mediastinal contours. No pneumothorax. No large pleural effusion. Pulmonary vascular congestion appears increased. Stable visualized osseous structures.  IMPRESSION: Chronically poor ventilation at both lung bases over this series of exams. Currently suspect pulmonary vascular congestion/ mild interstitial edema with severe bibasilar atelectasis. Lung base pneumonia is difficult to exclude.   Electronically Signed   By: Lars Pinks M.D.   On: 11/17/2014 09:50     Blood pressure 145/44, pulse 97, temperature 97 F (36.1 C), temperature source Axillary, resp. rate 22, SpO2 93 %.  PHYSICAL EXAM: Overall appearance:  Frail, pale, overweight.  Mental status OK.  Bloody soiling of face nose, mouth. Head:  NCAT  Ears:  TM's nl AU Nose:  lg clot RIGHT side, evacuated.   No vessels seen.  No active bleeding.  LEFT side moist and healthy. Mild ant septal excoriation both sides c/w nasal cannula O2 at home. Oral Cavity:edentulous.  Dried blood. Oral Pharynx/Hypopharynx/Larynx:  No active bleeding in pharynx Neuro: grossly intact Neck:clear    Assessment/Plan Coagulopathic epistaxis, now controlled.  Contribution from non-humidified nasal cannula O2 at home.    No therapeutic intervention at this time. Evacuating the clot from the RIGHT side may suffice to stop the bleeding.   Will order nasal hygiene instructions which should be continued at home.  Will use Afrin spray for 2 days to vasoconstrict and allow a possible bleeding site to heal.  Needs humidification on home O2.  Ordered a TSH given her low core temp on admission.  Call for further assistance please.  Jodi Marble 06/12/7840, 12:35 PM

## 2014-11-14 NOTE — Progress Notes (Signed)
UR completed.  Pt noted to be on home O2 prior to admission.  According to previous CM documentation and pt's insurance network, her home O2 is provided by [employer].  She has also had HH in the past with [facility]. Await medical stability to determine pt's needs for level of care at discharge.   Carlyle LipaMichelle Russia Scheiderer, RN BSN MHA CCM Trauma/Neuro ICU Case Manager [phone]

## 2014-11-14 NOTE — Progress Notes (Signed)
~  1945: Pt Pulse Ox continues to read low 80's. RT in room, Pt placed on Venti mask at 55%. Pt denies feeling short of breath but appears to have increase work of breathing. Lungs auscultation after breathing treatment to have Exp. Wheeze and fine crackles to right lobe. Pt placed on Non-rebreather.  MD called, notified of pt's condition, course of care from admission, and vitals. Orders received.  ~2100: Noted Troponin level to increase from 0.04 to 0.17. MD made aware, will continue to monitor.

## 2014-11-14 NOTE — Progress Notes (Signed)
Patient is currently on face tent for humidity. Sats are 94% on 80% and  10L.

## 2014-11-15 ENCOUNTER — Inpatient Hospital Stay (HOSPITAL_COMMUNITY): Payer: Medicare FFS

## 2014-11-15 DIAGNOSIS — J9600 Acute respiratory failure, unspecified whether with hypoxia or hypercapnia: Secondary | ICD-10-CM | POA: Insufficient documentation

## 2014-11-15 DIAGNOSIS — J9601 Acute respiratory failure with hypoxia: Secondary | ICD-10-CM

## 2014-11-15 DIAGNOSIS — I059 Rheumatic mitral valve disease, unspecified: Secondary | ICD-10-CM

## 2014-11-15 LAB — MAGNESIUM: MAGNESIUM: 2.7 mg/dL — AB (ref 1.5–2.5)

## 2014-11-15 LAB — BASIC METABOLIC PANEL
Anion gap: 10 (ref 5–15)
Anion gap: 7 (ref 5–15)
BUN: 103 mg/dL — AB (ref 6–23)
BUN: 96 mg/dL — ABNORMAL HIGH (ref 6–23)
CALCIUM: 9.4 mg/dL (ref 8.4–10.5)
CHLORIDE: 115 meq/L — AB (ref 96–112)
CO2: 24 mmol/L (ref 19–32)
CO2: 25 mmol/L (ref 19–32)
CREATININE: 3.62 mg/dL — AB (ref 0.50–1.10)
Calcium: 9.2 mg/dL (ref 8.4–10.5)
Chloride: 112 mEq/L (ref 96–112)
Creatinine, Ser: 3.22 mg/dL — ABNORMAL HIGH (ref 0.50–1.10)
GFR calc Af Amer: 15 mL/min — ABNORMAL LOW (ref >90)
GFR calc non Af Amer: 13 mL/min — ABNORMAL LOW (ref >90)
GFR, EST AFRICAN AMERICAN: 13 mL/min — AB (ref >90)
GFR, EST NON AFRICAN AMERICAN: 11 mL/min — AB (ref >90)
GLUCOSE: 147 mg/dL — AB (ref 70–99)
GLUCOSE: 162 mg/dL — AB (ref 70–99)
POTASSIUM: 5.8 mmol/L — AB (ref 3.5–5.1)
POTASSIUM: 4.6 mmol/L (ref 3.5–5.1)
SODIUM: 146 mmol/L — AB (ref 135–145)
SODIUM: 147 mmol/L — AB (ref 135–145)

## 2014-11-15 LAB — PREPARE FRESH FROZEN PLASMA
UNIT DIVISION: 0
UNIT DIVISION: 0
Unit division: 0
Unit division: 0
Unit division: 0

## 2014-11-15 LAB — CBC
HEMATOCRIT: 23 % — AB (ref 36.0–46.0)
HEMOGLOBIN: 7.1 g/dL — AB (ref 12.0–15.0)
MCH: 31.1 pg (ref 26.0–34.0)
MCHC: 30.9 g/dL (ref 30.0–36.0)
MCV: 100.9 fL — ABNORMAL HIGH (ref 78.0–100.0)
Platelets: 145 10*3/uL — ABNORMAL LOW (ref 150–400)
RBC: 2.28 MIL/uL — ABNORMAL LOW (ref 3.87–5.11)
RDW: 16.2 % — ABNORMAL HIGH (ref 11.5–15.5)
WBC: 7.7 10*3/uL (ref 4.0–10.5)

## 2014-11-15 LAB — POCT I-STAT 3, ART BLOOD GAS (G3+)
Acid-base deficit: 5 mmol/L — ABNORMAL HIGH (ref 0.0–2.0)
Bicarbonate: 22.6 mEq/L (ref 20.0–24.0)
O2 Saturation: 96 %
PH ART: 7.231 — AB (ref 7.350–7.450)
PO2 ART: 102 mmHg — AB (ref 80.0–100.0)
TCO2: 24 mmol/L (ref 0–100)
pCO2 arterial: 53.8 mmHg — ABNORMAL HIGH (ref 35.0–45.0)

## 2014-11-15 LAB — PHOSPHORUS: PHOSPHORUS: 5.6 mg/dL — AB (ref 2.3–4.6)

## 2014-11-15 LAB — CBC WITH DIFFERENTIAL/PLATELET
Basophils Absolute: 0 10*3/uL (ref 0.0–0.1)
Basophils Relative: 0 % (ref 0–1)
EOS ABS: 0 10*3/uL (ref 0.0–0.7)
Eosinophils Relative: 0 % (ref 0–5)
HEMATOCRIT: 25.9 % — AB (ref 36.0–46.0)
HEMOGLOBIN: 8.4 g/dL — AB (ref 12.0–15.0)
Lymphocytes Relative: 6 % — ABNORMAL LOW (ref 12–46)
Lymphs Abs: 0.6 10*3/uL — ABNORMAL LOW (ref 0.7–4.0)
MCH: 32.6 pg (ref 26.0–34.0)
MCHC: 32.4 g/dL (ref 30.0–36.0)
MCV: 100.4 fL — AB (ref 78.0–100.0)
MONO ABS: 0.8 10*3/uL (ref 0.1–1.0)
Monocytes Relative: 9 % (ref 3–12)
NEUTROS ABS: 8 10*3/uL — AB (ref 1.7–7.7)
Neutrophils Relative %: 85 % — ABNORMAL HIGH (ref 43–77)
Platelets: 159 10*3/uL (ref 150–400)
RBC: 2.58 MIL/uL — AB (ref 3.87–5.11)
RDW: 15.8 % — ABNORMAL HIGH (ref 11.5–15.5)
WBC: 9.4 10*3/uL (ref 4.0–10.5)

## 2014-11-15 LAB — TROPONIN I: Troponin I: 0.66 ng/mL (ref <0.031)

## 2014-11-15 LAB — HEMOGLOBIN AND HEMATOCRIT, BLOOD
HCT: 22.9 % — ABNORMAL LOW (ref 36.0–46.0)
Hemoglobin: 7.3 g/dL — ABNORMAL LOW (ref 12.0–15.0)

## 2014-11-15 LAB — BRAIN NATRIURETIC PEPTIDE: B Natriuretic Peptide: 1072 pg/mL — ABNORMAL HIGH (ref 0.0–100.0)

## 2014-11-15 LAB — MRSA PCR SCREENING: MRSA by PCR: NEGATIVE

## 2014-11-15 MED ORDER — ETOMIDATE 2 MG/ML IV SOLN
20.0000 mg | Freq: Once | INTRAVENOUS | Status: AC
  Administered 2014-11-15: 20 mg via INTRAVENOUS

## 2014-11-15 MED ORDER — FENTANYL BOLUS VIA INFUSION
25.0000 ug | INTRAVENOUS | Status: DC | PRN
  Filled 2014-11-15: qty 25

## 2014-11-15 MED ORDER — FUROSEMIDE 10 MG/ML IJ SOLN: 40.0000 mg | Freq: Three times a day (TID) | INTRAMUSCULAR | Status: DC

## 2014-11-15 MED ORDER — FUROSEMIDE 10 MG/ML IJ SOLN
40.0000 mg | Freq: Three times a day (TID) | INTRAMUSCULAR | Status: DC
  Administered 2014-11-15 (×2): 40 mg via INTRAVENOUS
  Filled 2014-11-15 (×5): qty 4

## 2014-11-15 MED ORDER — MIDAZOLAM HCL 2 MG/2ML IJ SOLN: 2.0000 mg | INTRAMUSCULAR | Status: DC | PRN

## 2014-11-15 MED ORDER — SODIUM CHLORIDE 0.9 % IV SOLN
25.0000 ug/h | INTRAVENOUS | Status: DC
  Administered 2014-11-15: 100 ug/h via INTRAVENOUS
  Filled 2014-11-15 (×2): qty 50

## 2014-11-15 MED ORDER — FENTANYL CITRATE 0.05 MG/ML IJ SOLN
INTRAMUSCULAR | Status: AC
  Filled 2014-11-15: qty 2

## 2014-11-15 MED ORDER — SODIUM CHLORIDE 0.9 % IV SOLN
25.0000 ug/h | INTRAVENOUS | Status: DC
  Administered 2014-11-15: 50 ug/h via INTRAVENOUS
  Filled 2014-11-15: qty 50

## 2014-11-15 MED ORDER — FENTANYL CITRATE 0.05 MG/ML IJ SOLN: 50.0000 ug | Freq: Once | INTRAMUSCULAR | Status: AC

## 2014-11-15 MED ORDER — MIDAZOLAM HCL 2 MG/2ML IJ SOLN
INTRAMUSCULAR | Status: AC
  Administered 2014-11-15: 2 mg
  Filled 2014-11-15: qty 4

## 2014-11-15 MED ORDER — FENTANYL CITRATE 0.05 MG/ML IJ SOLN
INTRAMUSCULAR | Status: AC
  Administered 2014-11-15: 100 ug
  Filled 2014-11-15: qty 4

## 2014-11-15 MED ORDER — FENTANYL CITRATE 0.05 MG/ML IJ SOLN
100.0000 ug | Freq: Once | INTRAMUSCULAR | Status: AC
  Administered 2014-11-15: 100 ug via INTRAVENOUS

## 2014-11-15 MED ORDER — ETOMIDATE 2 MG/ML IV SOLN
0.3000 mg/kg | Freq: Once | INTRAVENOUS | Status: DC
Start: 2014-11-15 — End: 2014-11-15

## 2014-11-15 MED ORDER — SODIUM POLYSTYRENE SULFONATE 15 GM/60ML PO SUSP
30.0000 g | Freq: Once | ORAL | Status: AC
  Administered 2014-11-15: 30 g via ORAL
  Filled 2014-11-15: qty 120

## 2014-11-15 MED ORDER — FENTANYL CITRATE 0.05 MG/ML IJ SOLN
50.0000 ug | Freq: Once | INTRAMUSCULAR | Status: AC
  Administered 2014-11-15: 50 ug via INTRAVENOUS

## 2014-11-15 NOTE — Consult Note (Signed)
WOC wound consult note Reason for Consult: LE wounds, and redness abdominal folds Wound type: Venous stasis ulcerations Measurement and wound bed: scattered partial and full thickness lesions over the LLE with some purpura and some serous blisters as well most 2cm x 2cm x 0.1 or smaller. Appears to be 4-5 areas. She currently does not have any open wounds on the RLE and no significant edema on the RLE.  Drainage (amount, consistency, odor) serousanginous, no purulence  Periwound:intact with venous dermatitis bilaterally  Pt has long history of venous insufficiency and venous stasis ulcers. When questioned and based on the appearance of the leg she reports she has been wrapped in a multilayer compression wrap.  [facility] [place].  I have contacted the wound care center to verify this and she is currently wrapped weekly on her LLE for edema management and topical wound care.  They have followed her since September, she previously had a HHRN performing weekly compression wrap changes and wound care.  Dressing procedure/placement/frequency: Replace multilayer compression to the LLE, xeroform as a non adherent under the compression wrap to protect the wound bed.  Change weekly, Q Thursday [facility] or W[facility] Treatment Associate.  WOC team will follow along with you for weekly wound assessments.  Please notify me of any acute changes in the wounds or any new areas of concerns Armen PickupMelody Nieko Clarin RN,CWOCN [phone]

## 2014-11-15 NOTE — Progress Notes (Signed)
  Echocardiogram 2D Echocardiogram has been performed.  Paula Fletcher 11/15/2014, 11:54 AM

## 2014-11-15 NOTE — Progress Notes (Signed)
CRITICAL VALUE ALERT  Critical value received:  Troponin: 0.66  Date of notification:  11/15/2014  Time of notification: 0325  Critical value read back:Yes.    Nurse who received alert:  Fransisco HertzJones, Amarilis Belflower D, RN   MD notified (1st page):  Dr. Craige CottaSood  Time of first page:  0325  MD notified (2nd page):  Time of second page:  Responding MD:  Dr. Craige CottaSood  Time MD responded:  [phone]

## 2014-11-15 NOTE — Procedures (Signed)
Central Venous Catheter Insertion Procedure Note Paula Fletcher [id number] 1935-09-27  Procedure: Insertion of Central Venous Catheter Indications: Assessment of intravascular volume, Drug and/or fluid administration and Frequent blood sampling  Procedure Details Consent: Unable to obtain consent because of altered level of consciousness. Time Out: Verified patient identification, verified procedure, site/side was marked, verified correct patient position, special equipment/implants available, medications/allergies/relevent history reviewed, required imaging and test results available.  Performed  Maximum sterile technique was used including antiseptics, cap, gloves, gown, hand hygiene, mask and sheet. Skin prep: Chlorhexidine; local anesthetic administered A antimicrobial bonded/coated triple lumen catheter was placed in the left internal jugular vein using the Seldinger technique.  Evaluation Blood flow good Complications: No apparent complications Patient did tolerate procedure well. Chest X-ray ordered to verify placement.  CXR: pending.  Procedure performed under direct ultrasound guidance for real time vessel cannulation.      Rutherford Guysahul Desai, [place] - C [place] [facility] Pgr: [phone]  or [phone] 11/15/2014, 4:23 PM

## 2014-11-15 NOTE — Procedures (Signed)
Intubation Procedure Note Dayton MartesCarolyn M Schwanke [id number] 07/13/1935  Procedure: Intubation Indications: Respiratory insufficiency  Procedure Details Consent: Unable to obtain consent because of emergent medical necessity.   Time Out: Verified patient identification, verified procedure, site/side was marked, verified correct patient position, special equipment/implants available, medications/allergies/relevent history reviewed, required imaging and test results available.  Performed  Maximum sterile technique was used including antiseptics, cap, gloves, gown, hand hygiene, mask and sheet.  MAC and 3 - Glide Scope. No blood visualized in posterior pharynx.  Mild petechiae noted.    Evaluation Hemodynamic Status: BP stable throughout; O2 sats: stable throughout Patient's Current Condition: stable Complications: No apparent complications Patient did tolerate procedure well. Chest X-ray ordered to verify placement.  CXR: pending.   Procedure performed under direct supervision of Dr. Marchelle Gearingamaswamy and with [facility] for direct visualization.   Canary BrimBrandi Lynnlee Revels, NP-C [place] [facility] Pgr: [phone] or [phone]    11/15/2014

## 2014-11-15 NOTE — Progress Notes (Signed)
Dr. Marchelle Gearingamaswamy requested pt to be placed on Face Tent.  Pt desat to 80% on 90% fio2.  RN and MD aware.  Pt placed on NRB, sat increased to 93%.

## 2014-11-15 NOTE — Progress Notes (Signed)
PULMONARY / CRITICAL CARE MEDICINE   Name: Paula Fletcher MRN: [id number] DOB: Mar 16, 1935    ADMISSION DATE:  12-11-2014 CONSULTATION DATE:  11/15/2014  REFERRING MD :  [facility]  CHIEF COMPLAINT:  Nose bleed, generalized weakness, SOB  INITIAL PRESENTATION:   .Paula Fletcher is a 80 y.o. F with PMH as outlined below including 4L O2 dependent COPD and A.fib on chronic coumadin, She presented to [facility] on 01/05 for epistaxis, generalized weakness, and SOB.  Epistaxis began roughly one day prior at 1230 and resolved spontaneously prior to re-starting again shortly thereafter.  She reports that bleeding was "like a stream", primarily out of the right nare.  Also believes that some blood went down her throat. In ED, she was found to have temp of 94 rectally, supratherapeutic INR at 7.64, anemia with Hgb 10, acute renal failure with SCr 3.90, hyperkalemia with K of 7.  In addition, she was started on BiPAP which was later changed to [facility].  Request was made to transfer to [facility] ICU for concerns that pt may deteriorate.  PCCM consulted for admission.  On arrival to [facility], pt noted to still be slowly bleeding from b/l nares as well as new bleeding from foley site.     STUDIES:  01/05 CT Head: no acute abnormality, opacification of right nasal cavity and nasopharynx, may be related to epistaxis. 01/05 - admitted to [facility] ICU 11/14/14: Still with intermittent very mild slow epistaxis. Hungry . Making urine.  ENT consult - Conservative Rx. Repeat FFP    SUBJECTIVE/OVERNIGHT/INTERVAL HX 11/15/14 - baseline 4LNC but now desaturating even at face mask. No active bleed   VITAL SIGNS: Temp:  [97 F (36.1 C)-98.5 F (36.9 C)] 97.6 F (36.4 C) (01/07 0746) Pulse Rate:  [74-132] 108 (01/07 0800) Resp:  [15-26] 26 (01/07 0800) BP: (87-163)/(32-86) 135/73 mmHg (01/07 0800) SpO2:  [84 %-100 %] 99 % (01/07 0800) FiO2 (%):  [50 %-100 %] 100 % (01/06 2315) HEMODYNAMICS:   VENTILATOR SETTINGS: Vent Mode:  [-]   FiO2 (%):  [50 %-100 %] 100 % INTAKE / OUTPUT: Intake/Output      01/06 0701 - 01/07 0700 01/07 0701 - 01/08 0700   P.O. 100    I.V. 98    Blood 479    IV Piggyback 50    Total Intake 727     Urine 950    Total Output 950     Net -223          Urine Occurrence 4 x      PHYSICAL EXAMINATION: General: WDWN female, in NAD. Neuro: A&O x 3, non-focal.  HEENT: [place]/AT. PERRL, sclerae anicteric.  Bright red blood noted in b/l nares but primarily right side, dried blood noted on tongue and in oropharynx. Cardiovascular: RRR, no M/R/G.  Lungs: Respirations even and unlabored. Dimionished BS throughout. No adventitious sounds Abdomen: BS x 4, soft, NT/ND.  Ext: Chronic stasis changes, trace b/l edema, multiple superficial ulcers on LLE.  Skin: Intact, warm, no rashes.  LABS: PULMONARY  Recent Labs Lab 12-11-14 1119  TCO2 17    CBC  Recent Labs Lab 12-11-2014 0921  11-Dec-2014 1939 11/14/14 0935 11/15/14 0145  HGB 10.0*  < > 9.0* 8.2* 8.4*  HCT 29.9*  < > 28.0* 26.0* 25.9*  WBC 5.7  --   --  8.1 9.4  PLT 192  --   --  172 159  < > = values in this interval not displayed.  COAGULATION  Recent  Labs Lab Mar 05, 2015 0921 Mar 05, 2015 1939 11/14/14 0935  INR 7.64* 8.45* 2.08*    CARDIAC    Recent Labs Lab 11/14/14 1100 11/14/14 2000 11/15/14 0145  TROPONINI 0.04* 0.17* 0.66*   No results for input(s): PROBNP in the last 168 hours.   CHEMISTRY  Recent Labs Lab Mar 05, 2015 0921 Mar 05, 2015 1119 Mar 05, 2015 2020 11/14/14 0935 11/15/14 0145  NA 137 137 140 144 146*  K 6.9* 7.0* 6.6* 5.0 5.8*  CL 109 110 112 111 112  CO2 21  --  18* 22 24  GLUCOSE 131* 120* 94 91 147*  BUN 105* 115* 108* 105* 103*  CREATININE 4.44* 3.90* 4.20* 3.89* 3.62*  CALCIUM 8.5  --  8.8 9.3 9.4  MG  --   --   --  2.7* 2.7*  PHOS  --   --   --  6.1* 5.6*   CrCl cannot be calculated (Unknown ideal weight.).   LIVER  Recent Labs Lab Mar 05, 2015 0921 Mar 05, 2015 1939 Mar 05, 2015 2020  11/14/14 0935  AST  --   --  19  --   ALT  --   --  20  --   ALKPHOS  --   --  62  --   BILITOT  --   --  0.1*  --   PROT  --   --  5.6*  --   ALBUMIN  --   --  3.2*  --   INR 7.64* 8.45*  --  2.08*     INFECTIOUS  Recent Labs Lab Mar 05, 2015 1242  LATICACIDVEN 0.8     ENDOCRINE CBG (last 3)  No results for input(s): GLUCAP in the last 72 hours.       IMAGING x48h Ct Head Wo Contrast  11/27/2014   CLINICAL DATA:  80 year old female with altered mental status and shortness of Breath. Epistaxis, on Coumadin. Initial encounter.  EXAM: CT HEAD WITHOUT CONTRAST  TECHNIQUE: Contiguous axial images were obtained from the base of the skull through the vertex without intravenous contrast.  COMPARISON:  None.  FINDINGS: Study is intermittently degraded by motion artifact despite repeated imaging attempts.  Opacifications/debris in the right nasal cavity and to a lesser extent right nasopharynx. Visualized paranasal sinuses and mastoids are clear.  Visualized orbits and scalp soft tissues are within normal limits.  No acute osseous abnormality identified.  Calcified atherosclerosis at the skull base. Cerebral volume is within normal limits for age. Patchy mostly periventricular cerebral white matter hypodensity. No midline shift, mass effect, or evidence of intracranial mass lesion. No ventriculomegaly. No suspicious intracranial vascular hyperdensity. No evidence of cortically based acute infarction identified.  IMPRESSION: 1. No acute intracranial abnormality. Mild to moderate for age nonspecific white matter changes. 2. Opacification of the right nasal cavity and nasopharynx may be related to epistaxis. Negative paranasal sinuses.   Electronically Signed   By: Augusto GambleLee  Hall M.D.   On: Sep 20, 2015 10:26   Dg Chest Port 1 View  11/14/2014   CLINICAL DATA:  Respiratory failure, community acquired pneumonia, COPD, atrial fibrillation and mitral stenosis  EXAM: PORTABLE CHEST - 1 VIEW  COMPARISON:   Portable chest x-ray of November 13, 2014  FINDINGS: The lungs are slightly better inflated today. The interstitial markings remain increased. There is patchy bibasilar atelectasis which has improved. The left hemidiaphragm is better visualized today. There is no significant pleural effusion. The cardiac silhouette remains enlarged. The pulmonary vascularity remains engorged but is less conspicuous today. The mediastinum is normal in width.  IMPRESSION: Slight  interval improvement in the appearance of the chest since yesterday's study. There is congestive heart failure with pulmonary interstitial edema superimposed upon COPD. Bibasilar atelectasis or pneumonia may coexist.   Electronically Signed   By: David  [place]   On: 11/14/2014 08:06        ASSESSMENT / PLAN:  PULMONARY A: #Baseline  - Moderate COPD with chronic O2 dependence  - Pulmonary nodule - 7mm RLL seen on chest CT Feb 2015.  #Currently   - admitted with 11/24/2014 - epistaxis -resolved with FFP INR Correcion 11/21/2014 and 16/16   - Currently Moderate  Acute hypoxic respiratory failure due to Probable pulmonary edema/Atlectasis in setting of ATN and epistaxis and blood products - CXR and clinically worse    -    P:   Aggressive lasix Continue supplemental O2 to maintain SpO2 > 92%. Ut NO NASAL CANNULA No BiPAP given epistaxis DuoNebs scheduled. Incentive spirometry. CXR in AM. Hold outpatient advair, spiriva.  CARDIOVASCULAR A:  CAF > NSR presently Risk of hyperkalemia induced arrhythmias Chronic diastolic heart failure - on opd  Hx HTN, HLD OPD meds:  lasix, losartan, lopressor, nifedipine, rosuvastatin for now.    - currently 11/15/14  Moderate volume overload and worse. Llkely worseing of acoute on chronic diastolic chf. BNP 1000  P:  Start IV lasix - aggressive diuresis Hold off on home bp meds   RENAL A:   AKI, nonoliguric Severe hyperkalemia Chronic edema Risk of volume overload   - improving creat  but K rising  P:   Continue lasix Kayexalate Hold off renal consult No more ARB Monitor BMET intermittently Monitor I/Os Correct electrolytes as indicated Saline lock   GASTROINTESTINAL A:   NO acute issues P:   SUP: IV PPI Clears (dc heart healthy diet)  HEMATOLOGIC A:   Warfarin induced coagulopathy Acute blood loss anemia Epistaxis VTE Prophylaxis   - s/p FFP complete 12/08/2014 and 11/14/14  P:  DVT px: SCD Monitor CBC intermittently Transfuse RBCs for Hgb < 7.0 or hemorrhagic shock - not indicated presently Probably a very poor candidate for long term warfarin  INFECTIOUS A:   Chronic venous stasis ulcers LLE Pulmonary infiltrates - doubt PNA P:   Blood Cx 01/05 >>> Sputum Cx 01/05 >>> Follow off abx for now WOC consult requested  ENDOCRINE A:   Hyperglycemia without documented hx of DM P:   Consider SSI if glucose > 180.  NEUROLOGIC A:   Chronic pain   - on opd gabapentin and norco  11/15/14: no complaints of pain . No acute issues.  P:   No interventions required. Rx reduce dose gabapentin due to ATN Hold opd norco.   Family updated: Patient updated 11/14/14 and 11/15/2014. No family at bedside - per RN daughter calls for update   Interdisciplinary Family Meeting v Palliative Care Meeting:  Due by: 01/12.     GLOBAL Worse today. At risk for intubation. Cannot have bipap or Nasal cannula due to epistaxis. Diurese aggressively. Saline lock Hopefully can ward off intubation 11/15/2014 9:56 AM    Critical Care Time devoted to patient care services described in this note is  35  Minutes. This time reflects time of care of this signee Dr Kalman Shan. This critical care time does not reflect procedure time, or teaching time or supervisory time of PA/NP/Med student/Med Resident etc but could involve care discussion time    Dr. Kalman Shan, M.D., [facility]C.P Pulmonary and Critical Care Medicine Staff Physician [facility] [place]  Pulmonary and Critical Care Pager: [phone], If no answer or between  15:00h - 7:00h: call [phone]  11/15/2014 9:57 AM

## 2014-11-15 NOTE — Progress Notes (Signed)
11/15/2014 9:07 AM  Kelton Pillar [id number]      Temp:  [97 F (36.1 C)-98.5 F (36.9 C)] 97.6 F (36.4 C) (01/07 0746) Pulse Rate:  [74-132] 108 (01/07 0800) Resp:  [15-26] 26 (01/07 0800) BP: (87-163)/(32-86) 135/73 mmHg (01/07 0800) SpO2:  [84 %-100 %] 99 % (01/07 0800) FiO2 (%):  [50 %-100 %] 100 % (01/06 2315),     Intake/Output Summary (Last 24 hours) at 11/15/14 0907 Last data filed at 11/15/14 0500  Gross per 24 hour  Intake    488 ml  Output    950 ml  Net   -462 ml    Results for orders placed or performed during the hospital encounter of 11-18-2014 (from the past 24 hour(s))  CBC     Status: Abnormal   Collection Time: 11/14/14  9:35 AM  Result Value Ref Range   WBC 8.1 4.0 - 10.5 K/uL   RBC 2.65 (L) 3.87 - 5.11 MIL/uL   Hemoglobin 8.2 (L) 12.0 - 15.0 g/dL   HCT 40.9 (L) 81.1 - 91.4 %   MCV 98.1 78.0 - 100.0 fL   MCH 30.9 26.0 - 34.0 pg   MCHC 31.5 30.0 - 36.0 g/dL   RDW 78.2 (H) 95.6 - 21.3 %   Platelets 172 150 - 400 K/uL  Magnesium     Status: Abnormal   Collection Time: 11/14/14  9:35 AM  Result Value Ref Range   Magnesium 2.7 (H) 1.5 - 2.5 mg/dL  Phosphorus     Status: Abnormal   Collection Time: 11/14/14  9:35 AM  Result Value Ref Range   Phosphorus 6.1 (H) 2.3 - 4.6 mg/dL  Basic metabolic panel     Status: Abnormal   Collection Time: 11/14/14  9:35 AM  Result Value Ref Range   Sodium 144 135 - 145 mmol/L   Potassium 5.0 3.5 - 5.1 mmol/L   Chloride 111 96 - 112 mEq/L   CO2 22 19 - 32 mmol/L   Glucose, Bld 91 70 - 99 mg/dL   BUN 086 (H) 6 - 23 mg/dL   Creatinine, Ser 5.78 (H) 0.50 - 1.10 mg/dL   Calcium 9.3 8.4 - 46.9 mg/dL   GFR calc non Af Amer 10 (L) >90 mL/min   GFR calc Af Amer 12 (L) >90 mL/min   Anion gap 11 5 - 15  Protime-INR     Status: Abnormal   Collection Time: 11/14/14  9:35 AM  Result Value Ref Range   Prothrombin Time 23.5 (H) 11.6 - 15.2 seconds   INR 2.08 (H) 0.00 - 1.49  Troponin I     Status: Abnormal   Collection  Time: 11/14/14 11:00 AM  Result Value Ref Range   Troponin I 0.04 (H) <0.031 ng/mL  Prepare fresh frozen plasma     Status: None (Preliminary result)   Collection Time: 11/14/14 12:00 PM  Result Value Ref Range   Unit Number [id number]    Blood Component Type THW PLS APHR    Unit division 00    Status of Unit ISSUED    Transfusion Status OK TO TRANSFUSE   TSH     Status: None   Collection Time: 11/14/14  1:50 PM  Result Value Ref Range   TSH 0.731 0.350 - 4.500 uIU/mL  Troponin I     Status: Abnormal   Collection Time: 11/14/14  8:00 PM  Result Value Ref Range   Troponin I 0.17 (H) <0.031 ng/mL  Troponin I  Status: Abnormal   Collection Time: 11/15/14  1:45 AM  Result Value Ref Range   Troponin I 0.66 (HH) <0.031 ng/mL  Magnesium     Status: Abnormal   Collection Time: 11/15/14  1:45 AM  Result Value Ref Range   Magnesium 2.7 (H) 1.5 - 2.5 mg/dL  Phosphorus     Status: Abnormal   Collection Time: 11/15/14  1:45 AM  Result Value Ref Range   Phosphorus 5.6 (H) 2.3 - 4.6 mg/dL  Basic metabolic panel     Status: Abnormal   Collection Time: 11/15/14  1:45 AM  Result Value Ref Range   Sodium 146 (H) 135 - 145 mmol/L   Potassium 5.8 (H) 3.5 - 5.1 mmol/L   Chloride 112 96 - 112 mEq/L   CO2 24 19 - 32 mmol/L   Glucose, Bld 147 (H) 70 - 99 mg/dL   BUN 161103 (H) 6 - 23 mg/dL   Creatinine, Ser 0.963.62 (H) 0.50 - 1.10 mg/dL   Calcium 9.4 8.4 - 04.510.5 mg/dL   GFR calc non Af Amer 11 (L) >90 mL/min   GFR calc Af Amer 13 (L) >90 mL/min   Anion gap 10 5 - 15  CBC with Differential     Status: Abnormal   Collection Time: 11/15/14  1:45 AM  Result Value Ref Range   WBC 9.4 4.0 - 10.5 K/uL   RBC 2.58 (L) 3.87 - 5.11 MIL/uL   Hemoglobin 8.4 (L) 12.0 - 15.0 g/dL   HCT 40.925.9 (L) 81.136.0 - 91.446.0 %   MCV 100.4 (H) 78.0 - 100.0 fL   MCH 32.6 26.0 - 34.0 pg   MCHC 32.4 30.0 - 36.0 g/dL   RDW 78.215.8 (H) 95.611.5 - 21.315.5 %   Platelets 159 150 - 400 K/uL   Neutrophils Relative % 85 (H) 43 - 77 %    Lymphocytes Relative 6 (L) 12 - 46 %   Monocytes Relative 9 3 - 12 %   Eosinophils Relative 0 0 - 5 %   Basophils Relative 0 0 - 1 %   Neutro Abs 8.0 (H) 1.7 - 7.7 K/uL   Lymphs Abs 0.6 (L) 0.7 - 4.0 K/uL   Monocytes Absolute 0.8 0.1 - 1.0 K/uL   Eosinophils Absolute 0.0 0.0 - 0.7 K/uL   Basophils Absolute 0.0 0.0 - 0.1 K/uL   Smear Review MORPHOLOGY UNREMARKABLE   Brain natriuretic peptide     Status: Abnormal   Collection Time: 11/15/14  1:45 AM  Result Value Ref Range   B Natriuretic Peptide 1072.0 (H) 0.0 - 100.0 pg/mL  MRSA PCR Screening     Status: None   Collection Time: 11/15/14  6:26 AM  Result Value Ref Range   MRSA by PCR NEGATIVE NEGATIVE    SUBJECTIVE:  No further bleeding.  OBJECTIVE:  Nose moist, clean.  No bleeding noted  IMPRESSION:  TSH OK.  No further epistaxis  PLAN:  Nasal hygiene measures.  Call for further assistance.  Flo ShanksWOLICKI, Yaritzi Craun

## 2014-11-15 NOTE — Progress Notes (Signed)
~  0515[name]: Called [facility] nurse Nikki to discussion pts condition, vitals, appearance, assessment, labs, etc. Express nurses concerns about pt's presentation. Informed MD was in emergent situation and will update MD to look in on pt.

## 2014-11-15 NOTE — Progress Notes (Signed)
S:   Called to bedside to assess patient for hypoxemia.  Pt reports "feeling tired with her breathing".  RN reports pt has had progressive work of breathing and decreased O2 sats throughout the day despite increased O2 (15L)  O: Blood pressure 106/46, pulse 123, temperature 97.6 F (36.4 C), temperature source Axillary, resp. rate 27, SpO2 83-90% %.  Exam: General: wdwn adult female Neuro:  Awakens, oriented CV: s1s2 irr, tachy PULM: moist cough, blood in nares, lungs bilaterally with crackles / rhonchi Extremities: light grey, cool   A: Acute Hypoxic Respiratory Failure Epistaxis    P:  Now intubation  Follow up CXR post ETT placement  Poor IV access, place CVC Wean PEEP/FiO2 for sats > 90% PRVC 480 / 16 / PEEP 10 / FiO2 100% ABG in 30 minutes    Paula BrimBrandi Ollis, NP-C [facility] Pgr: [phone] or [phone]

## 2014-11-16 ENCOUNTER — Inpatient Hospital Stay (HOSPITAL_COMMUNITY): Payer: Medicare FFS

## 2014-11-16 LAB — CBC WITH DIFFERENTIAL/PLATELET
BASOS ABS: 0 10*3/uL (ref 0.0–0.1)
BASOS PCT: 0 % (ref 0–1)
Basophils Absolute: 0 10*3/uL (ref 0.0–0.1)
Basophils Relative: 0 % (ref 0–1)
EOS ABS: 0 10*3/uL (ref 0.0–0.7)
EOS ABS: 0.1 10*3/uL (ref 0.0–0.7)
Eosinophils Relative: 0 % (ref 0–5)
Eosinophils Relative: 1 % (ref 0–5)
HCT: 22.8 % — ABNORMAL LOW (ref 36.0–46.0)
HEMATOCRIT: 22.8 % — AB (ref 36.0–46.0)
HEMOGLOBIN: 7.2 g/dL — AB (ref 12.0–15.0)
Hemoglobin: 7.1 g/dL — ABNORMAL LOW (ref 12.0–15.0)
LYMPHS ABS: 0.9 10*3/uL (ref 0.7–4.0)
LYMPHS PCT: 9 % — AB (ref 12–46)
Lymphocytes Relative: 11 % — ABNORMAL LOW (ref 12–46)
Lymphs Abs: 0.7 10*3/uL (ref 0.7–4.0)
MCH: 31.6 pg (ref 26.0–34.0)
MCH: 32.7 pg (ref 26.0–34.0)
MCHC: 31.1 g/dL (ref 30.0–36.0)
MCHC: 31.6 g/dL (ref 30.0–36.0)
MCV: 101.3 fL — AB (ref 78.0–100.0)
MCV: 103.6 fL — ABNORMAL HIGH (ref 78.0–100.0)
MONOS PCT: 12 % (ref 3–12)
Monocytes Absolute: 0.9 10*3/uL (ref 0.1–1.0)
Monocytes Absolute: 0.7 10*3/uL (ref 0.1–1.0)
Monocytes Relative: 9 % (ref 3–12)
NEUTROS PCT: 79 % — AB (ref 43–77)
Neutro Abs: 5.7 10*3/uL (ref 1.7–7.7)
Neutro Abs: 6 10*3/uL (ref 1.7–7.7)
Neutrophils Relative %: 79 % — ABNORMAL HIGH (ref 43–77)
Platelets: 139 10*3/uL — ABNORMAL LOW (ref 150–400)
Platelets: 135 10*3/uL — ABNORMAL LOW (ref 150–400)
RBC: 2.25 MIL/uL — ABNORMAL LOW (ref 3.87–5.11)
RBC: 2.2 MIL/uL — ABNORMAL LOW (ref 3.87–5.11)
RDW: 16.3 % — AB (ref 11.5–15.5)
RDW: 16.3 % — ABNORMAL HIGH (ref 11.5–15.5)
WBC: 7.3 10*3/uL (ref 4.0–10.5)
WBC: 7.6 10*3/uL (ref 4.0–10.5)

## 2014-11-16 LAB — GLUCOSE, CAPILLARY
GLUCOSE-CAPILLARY: 144 mg/dL — AB (ref 70–99)
Glucose-Capillary: 116 mg/dL — ABNORMAL HIGH (ref 70–99)

## 2014-11-16 LAB — BASIC METABOLIC PANEL
Anion gap: 4 — ABNORMAL LOW (ref 5–15)
BUN: 98 mg/dL — AB (ref 6–23)
CO2: 27 mmol/L (ref 19–32)
Calcium: 9.2 mg/dL (ref 8.4–10.5)
Chloride: 117 mEq/L — ABNORMAL HIGH (ref 96–112)
Creatinine, Ser: 3.13 mg/dL — ABNORMAL HIGH (ref 0.50–1.10)
GFR calc non Af Amer: 13 mL/min — ABNORMAL LOW (ref >90)
GFR, EST AFRICAN AMERICAN: 15 mL/min — AB (ref >90)
Glucose, Bld: 118 mg/dL — ABNORMAL HIGH (ref 70–99)
Potassium: 4.7 mmol/L (ref 3.5–5.1)
SODIUM: 148 mmol/L — AB (ref 135–145)

## 2014-11-16 LAB — PHOSPHORUS: PHOSPHORUS: 4.3 mg/dL (ref 2.3–4.6)

## 2014-11-16 LAB — CBC
HCT: 22.6 % — ABNORMAL LOW (ref 36.0–46.0)
HEMATOCRIT: 22.9 % — AB (ref 36.0–46.0)
HEMATOCRIT: 25.3 % — AB (ref 36.0–46.0)
HEMOGLOBIN: 7.1 g/dL — AB (ref 12.0–15.0)
HEMOGLOBIN: 7.1 g/dL — AB (ref 12.0–15.0)
Hemoglobin: 7.8 g/dL — ABNORMAL LOW (ref 12.0–15.0)
MCH: 31.6 pg (ref 26.0–34.0)
MCH: 32.7 pg (ref 26.0–34.0)
MCH: 31 pg (ref 26.0–34.0)
MCHC: 31 g/dL (ref 30.0–36.0)
MCHC: 31.4 g/dL (ref 30.0–36.0)
MCHC: 30.8 g/dL (ref 30.0–36.0)
MCV: 101.8 fL — ABNORMAL HIGH (ref 78.0–100.0)
MCV: 104.1 fL — ABNORMAL HIGH (ref 78.0–100.0)
MCV: 100.4 fL — ABNORMAL HIGH (ref 78.0–100.0)
PLATELETS: 134 10*3/uL — AB (ref 150–400)
PLATELETS: 149 10*3/uL — AB (ref 150–400)
Platelets: 144 10*3/uL — ABNORMAL LOW (ref 150–400)
RBC: 2.25 MIL/uL — AB (ref 3.87–5.11)
RBC: 2.17 MIL/uL — AB (ref 3.87–5.11)
RBC: 2.52 MIL/uL — AB (ref 3.87–5.11)
RDW: 16.4 % — AB (ref 11.5–15.5)
RDW: 16.5 % — ABNORMAL HIGH (ref 11.5–15.5)
RDW: 17.3 % — ABNORMAL HIGH (ref 11.5–15.5)
WBC: 8.5 10*3/uL (ref 4.0–10.5)
WBC: 8.2 10*3/uL (ref 4.0–10.5)
WBC: 9.7 10*3/uL (ref 4.0–10.5)

## 2014-11-16 LAB — PREPARE RBC (CROSSMATCH)

## 2014-11-16 LAB — PROTIME-INR
INR: 1.58 — ABNORMAL HIGH (ref 0.00–1.49)
Prothrombin Time: 19 seconds — ABNORMAL HIGH (ref 11.6–15.2)

## 2014-11-16 LAB — MAGNESIUM: Magnesium: 2.5 mg/dL (ref 1.5–2.5)

## 2014-11-16 LAB — BRAIN NATRIURETIC PEPTIDE: B Natriuretic Peptide: 2059.5 pg/mL — ABNORMAL HIGH (ref 0.0–100.0)

## 2014-11-16 LAB — PROCALCITONIN: Procalcitonin: 0.18 ng/mL

## 2014-11-16 MED ORDER — METOPROLOL TARTRATE 1 MG/ML IV SOLN
2.5000 mg | INTRAVENOUS | Status: DC | PRN
  Administered 2014-11-16 – 2014-11-17 (×4): 5 mg via INTRAVENOUS
  Filled 2014-11-16 (×4): qty 5

## 2014-11-16 MED ORDER — FUROSEMIDE 10 MG/ML IJ SOLN: 40.0000 mg | Freq: Every day | INTRAMUSCULAR | Status: DC

## 2014-11-16 MED ORDER — DILTIAZEM HCL 100 MG IV SOLR
5.0000 mg/h | INTRAVENOUS | Status: DC
  Administered 2014-11-16: 5 mg/h via INTRAVENOUS
  Filled 2014-11-16: qty 100

## 2014-11-16 MED ORDER — MIDAZOLAM HCL 2 MG/2ML IJ SOLN
1.0000 mg | INTRAMUSCULAR | Status: DC | PRN
  Administered 2014-11-16 – 2014-11-21 (×8): 2 mg via INTRAVENOUS
  Filled 2014-11-16 (×8): qty 2

## 2014-11-16 MED ORDER — AMIODARONE LOAD VIA INFUSION
150.0000 mg | Freq: Once | INTRAVENOUS | Status: AC
  Administered 2014-11-16: 150 mg via INTRAVENOUS
  Filled 2014-11-16: qty 83.34

## 2014-11-16 MED ORDER — AMIODARONE HCL IN DEXTROSE 360-4.14 MG/200ML-% IV SOLN
60.0000 mg/h | INTRAVENOUS | Status: AC
  Administered 2014-11-16 (×2): 60 mg/h via INTRAVENOUS
  Filled 2014-11-16 (×2): qty 200

## 2014-11-16 MED ORDER — CHLORHEXIDINE GLUCONATE 0.12 % MT SOLN
15.0000 mL | Freq: Two times a day (BID) | OROMUCOSAL | Status: DC
  Administered 2014-11-16 – 2014-11-25 (×19): 15 mL via OROMUCOSAL
  Filled 2014-11-16 (×19): qty 15

## 2014-11-16 MED ORDER — SODIUM CHLORIDE 0.9 % IV BOLUS (SEPSIS)
500.0000 mL | Freq: Once | INTRAVENOUS | Status: AC
  Administered 2014-11-16: 500 mL via INTRAVENOUS

## 2014-11-16 MED ORDER — METOPROLOL TARTRATE 1 MG/ML IV SOLN
INTRAVENOUS | Status: AC
  Administered 2014-11-16: 3 mg via INTRAVENOUS
  Filled 2014-11-16: qty 5

## 2014-11-16 MED ORDER — PRO-STAT SUGAR FREE PO LIQD
60.0000 mL | Freq: Four times a day (QID) | ORAL | Status: DC
  Administered 2014-11-16 – 2014-11-22 (×25): 60 mL
  Filled 2014-11-16 (×27): qty 60

## 2014-11-16 MED ORDER — AMIODARONE HCL IN DEXTROSE 360-4.14 MG/200ML-% IV SOLN
30.0000 mg/h | INTRAVENOUS | Status: DC
  Administered 2014-11-16 – 2014-11-18 (×6): 30 mg/h via INTRAVENOUS
  Filled 2014-11-16 (×8): qty 200

## 2014-11-16 MED ORDER — METOPROLOL TARTRATE 1 MG/ML IV SOLN
3.0000 mg | Freq: Once | INTRAVENOUS | Status: AC
  Administered 2014-11-16: 3 mg via INTRAVENOUS

## 2014-11-16 MED ORDER — CETYLPYRIDINIUM CHLORIDE 0.05 % MT LIQD
7.0000 mL | Freq: Four times a day (QID) | OROMUCOSAL | Status: DC
  Administered 2014-11-16 – 2014-11-25 (×36): 7 mL via OROMUCOSAL

## 2014-11-16 MED ORDER — PHENYLEPHRINE HCL 10 MG/ML IJ SOLN
30.0000 ug/min | INTRAVENOUS | Status: DC
  Administered 2014-11-16: 30 ug/min via INTRAVENOUS
  Filled 2014-11-16: qty 1

## 2014-11-16 MED ORDER — VITAL HIGH PROTEIN PO LIQD
1000.0000 mL | ORAL | Status: DC
  Administered 2014-11-16 – 2014-11-22 (×7): 1000 mL
  Filled 2014-11-16 (×9): qty 1000

## 2014-11-16 MED ORDER — SODIUM CHLORIDE 0.9 % IV SOLN
Freq: Once | INTRAVENOUS | Status: AC
  Administered 2014-11-16: 17:00:00 via INTRAVENOUS

## 2014-11-16 MED ORDER — DEXTROSE 5 % IV SOLN
30.0000 ug/min | INTRAVENOUS | Status: DC
  Administered 2014-11-16: 70 ug/min via INTRAVENOUS
  Administered 2014-11-17: 30 ug/min via INTRAVENOUS
  Administered 2014-11-17: 40 ug/min via INTRAVENOUS
  Administered 2014-11-17: 50 ug/min via INTRAVENOUS
  Filled 2014-11-16 (×3): qty 4

## 2014-11-16 NOTE — Progress Notes (Signed)
eLink Physician-Brief Progress Note Patient Name: Paula MartesCarolyn M Fletcher DOB: 14-Jul-1935 MRN: [id number]   Date of Service  11/16/2014  HPI/Events of Note  HR improving with amiodarone.  BP better, but still low.  No improvement in U/O.  Hb 7.1.   eICU Interventions  Will give 1 unit PRBC, check CVP, and add phenylephrine.      Intervention Category Major Interventions: Other:  Taura Lamarre 11/16/2014, 4:21 PM

## 2014-11-16 NOTE — Progress Notes (Signed)
INITIAL NUTRITION ASSESSMENT  DOCUMENTATION CODES Per approved criteria  -Morbid Obesity   INTERVENTION: Initiate Vital High Protein @ 15 ml/hr via OG tube   60 ml Prostat QID.    Tube feeding regimen provides 1160 kcal (64% of needs), 151 grams of protein, and 300 ml of H2O.   NUTRITION DIAGNOSIS: Inadequate oral intake related to inability to eat as evidenced by NPO status  Goal: Enteral nutrition to provide 60-70% of estimated calorie needs (22-25 kcals/kg ideal body weight) and 100% of estimated protein needs, based on ASPEN guidelines for hypocaloric, high protein feeding in critically ill obese individuals  Monitor:  Respiratory status, TF initiation and tolerance, labs  Reason for Assessment: Consult received to initiate and manage enteral nutrition support.  80 y.o. female  Admitting Dx: Nose bleed, generalized weakness, SOB  ASSESSMENT: Pt with hx of COPD on O2 at home admitted for epistaxis. Pt decompensated and intubated 1/7.   Patient is currently intubated on ventilator support MV: 9 L/min Temp (24hrs), Avg:98.6 F (37 C), Min:98.2 F (36.8 C), Max:98.7 F (37.1 C)  Pt with OG tube, tip enters the stomach.  No signs of fat or muscle depletion Pt discussed during ICU rounds and with RN.  No MVI at this time due to recent hyperkalemia.   Sodium elevated BUN/Cr elevated Potassium WNL  Height: Ht Readings from Last 1 Encounters:  11/15/14 5\' 6"  (1.676 m)    Weight: Wt Readings from Last 1 Encounters:  11/16/14 261 lb 0.4 oz (118.4 kg)    Ideal Body Weight: 59 kg   % Ideal Body Weight: 201%  Wt Readings from Last 10 Encounters:  11/16/14 261 lb 0.4 oz (118.4 kg)  03/22/14 261 lb (118.389 kg)  02/02/14 254 lb (115.214 kg)  12/18/13 244 lb 11.2 oz (110.995 kg)    Usual Body Weight: 240-260   % Usual Body Weight: 100%  BMI:  Body mass index is 42.15 kg/(m^2).  Estimated Nutritional Needs: Kcal: 1807  Protein: >/= 145 grams Fluid: > 1.5  L/day  Skin:  LLE venous statis ulcers  Diet Order: Diet NPO time specified  EDUCATION NEEDS: -No education needs identified at this time   Intake/Output Summary (Last 24 hours) at 11/16/14 1003 Last data filed at 11/16/14 0900  Gross per 24 hour  Intake 491.83 ml  Output   1125 ml  Net -633.17 ml    Last BM: PTA   Labs:   Recent Labs Lab 11/14/14 0935 11/15/14 0145 11/15/14 1730 11/16/14 0239  NA 144 146* 147* 148*  K 5.0 5.8* 4.6 4.7  CL 111 112 115* 117*  CO2 22 24 25 27   BUN 105* 103* 96* 98*  CREATININE 3.89* 3.62* 3.22* 3.13*  CALCIUM 9.3 9.4 9.2 9.2  MG 2.7* 2.7*  --  2.5  PHOS 6.1* 5.6*  --  4.3  GLUCOSE 91 147* 162* 118*    CBG (last 3)  No results for input(s): GLUCAP in the last 72 hours.  Scheduled Meds: . sodium chloride   Intravenous Once  . antiseptic oral rinse  7 mL Mouth Rinse QID  . bacitracin   Topical BID  . chlorhexidine  15 mL Mouth Rinse BID  . [START ON 11/17/2014] furosemide  40 mg Intravenous Daily  . ipratropium-albuterol  3 mL Nebulization Q6H  . pantoprazole (PROTONIX) IV  40 mg Intravenous Q24H  . silver nitrate applicators  1 Stick Topical Once    Continuous Infusions: . diltiazem (CARDIZEM) infusion    .  fentaNYL infusion INTRAVENOUS 100 mcg/hr (11/15/14 1944)    Past Medical History  Diagnosis Date  . COPD (chronic obstructive pulmonary disease)   . Essential hypertension, benign   . Hyperlipidemia   . Gout   . Pleural effusion, bilateral     Noted February 2015 in the setting of pneumonia and diastolic heart failure  . CAP (community acquired pneumonia)   . Chronic diastolic heart failure     LVEF 60-65%, grade 1 diastolic dysfunction  . Atrial fibrillation     Diagnosed February 2015  . Lung nodule     7 mm right lower lobe pulmonary nodule - chest CT February 2015  . Mitral stenosis     Mild to moderate    Past Surgical History  Procedure Laterality Date  . Cholecystectomy    . Appendectomy    .  Abdominal hysterectomy      Kendell Bane RD, LDN, CNSC [phone] Pager [phone] After Hours Pager

## 2014-11-16 NOTE — Progress Notes (Addendum)
Spoke with Dr. Darrick Pennaeterding about bolus not working to improve HR. Order received for lopressor PRN and to increase sedation. Will monitor. Dr. Darrick Pennaeterding later notified of patient's HR decreased to 115-120's. Stated that was sufficient. Will monitor.

## 2014-11-16 NOTE — Progress Notes (Addendum)
PULMONARY / CRITICAL CARE MEDICINE   Name: Paula Fletcher MRN: [id number] DOB: 1935-10-23    ADMISSION DATE:  11/27/2014 CONSULTATION DATE:  11/16/2014  REFERRING MD :  [facility]  CHIEF COMPLAINT:  Nose bleed, generalized weakness, SOB  INITIAL PRESENTATION:   .Paula Fletcher is a 80 y.o. F with PMH as outlined below including 4L O2 dependent COPD and A.fib on chronic coumadin, She presented to [facility] on 01/05 for epistaxis, generalized weakness, and SOB.  Epistaxis began roughly one day prior at 1230 and resolved spontaneously prior to re-starting again shortly thereafter.  She reports that bleeding was "like a stream", primarily out of the right nare.  Also believes that some blood went down her throat. In ED, she was found to have temp of 94 rectally, supratherapeutic INR at 7.64, anemia with Hgb 10, acute renal failure with SCr 3.90, hyperkalemia with K of 7.  In addition, she was started on BiPAP which was later changed to [facility].  Request was made to transfer to [facility] ICU for concerns that pt may deteriorate.  PCCM consulted for admission.  On arrival to [facility], pt noted to still be slowly bleeding from b/l nares as well as new bleeding from foley site.     STUDIES:  01/05 CT Head: no acute abnormality, opacification of right nasal cavity and nasopharynx, may be related to epistaxis. 01/05 - admitted to [facility] ICU 11/14/14: Still with intermittent very mild slow epistaxis. Hungry . Making urine.  ENT consult - Conservative Rx. Repeat FFP 11/15/14 - progressive hypoxemic resp faiure despite lasix ,INTUBATED +. CENTRAL LINE +   SUBJECTIVE/OVERNIGHT/INTERVAL HX 11/16/2014 - now in A Fib RVR and BP soft. On 100% fio2, peep 8. Creat slowly improving.  RT reports blood clots of et tube being suctioned - likely from prior epistaxis. Pharm reports likely septra interaction with coumading as cause of high INR at admit   VITAL SIGNS: Temp:  [98.2 F (36.8 C)-98.7 F (37.1 C)] 98.2 F (36.8 C) (01/08  0800) Pulse Rate:  [34-138] 138 (01/08 0900) Resp:  [14-29] 17 (01/08 0900) BP: (94-141)/(34-64) 104/39 mmHg (01/08 0900) SpO2:  [80 %-99 %] 96 % (01/08 0900) FiO2 (%):  [90 %-100 %] 100 % (01/08 0900) Weight:  [118.4 kg (261 lb 0.4 oz)] 118.4 kg (261 lb 0.4 oz) (01/08 0300) HEMODYNAMICS:   VENTILATOR SETTINGS: Vent Mode:  [-] PRVC FiO2 (%):  [90 %-100 %] 100 % Set Rate:  [16 bmp] 16 bmp Vt Set:  [480 mL] 480 mL PEEP:  [8 cmH20] 8 cmH20 Plateau Pressure:  [19 cmH20-26 cmH20] 19 cmH20 INTAKE / OUTPUT: Intake/Output      01/07 0701 - 01/08 0700 01/08 0701 - 01/09 0700   P.O. 360    I.V. (mL/kg) 111.8 (0.9) 20 (0.2)   Blood     IV Piggyback     Total Intake(mL/kg) 471.8 (4) 20 (0.2)   Urine (mL/kg/hr) 1125 (0.4)    Total Output 1125     Net -653.2 +20        Urine Occurrence 2 x      PHYSICAL EXAMINATION: General: WDWN female, in NAD. Neuro: A&O x 3, CAM-ICU neg for delirium HEENT: ET tube +. No active bleeding  oropharynx. Cardiovascular: RRR, no M/R/G.  Lungs: Respirations even and unlabored. Dimionished BS throughout. No adventitious sounds Abdomen: BS x 4, soft, NT/ND.  Ext: Chronic stasis changes, trace b/l edema, multiple superficial ulcers on LLE. In ACe wrap 11/16/2014  Skin: Intact, warm, no  rashes.  LABS: PULMONARY  Recent Labs Lab 11/12/2014 1119 11/15/14 1729  PHART  --  7.231*  PCO2ART  --  53.8*  PO2ART  --  102.0*  HCO3  --  22.6  TCO2 17 24  O2SAT  --  96.0    CBC  Recent Labs Lab 11/16/14 0159 11/16/14 0239 11/16/14 0800  HGB 7.1* 7.2* 7.1*  HCT 22.8* 22.8* 22.9*  WBC 7.3 7.6 8.5  PLT 139* 135* 144*    COAGULATION  Recent Labs Lab 11/29/2014 0921 11/24/2014 1939 11/14/14 0935 11/16/14 0239  INR 7.64* 8.45* 2.08* 1.58*    CARDIAC    Recent Labs Lab 11/14/14 1100 11/14/14 2000 11/15/14 0145  TROPONINI 0.04* 0.17* 0.66*   No results for input(s): PROBNP in the last 168 hours.   CHEMISTRY  Recent Labs Lab  12/07/2014 2020 11/14/14 0935 11/15/14 0145 11/15/14 1730 11/16/14 0239  NA 140 144 146* 147* 148*  K 6.6* 5.0 5.8* 4.6 4.7  CL 112 111 112 115* 117*  CO2 18* 22 24 25 27   GLUCOSE 94 91 147* 162* 118*  BUN 108* 105* 103* 96* 98*  CREATININE 4.20* 3.89* 3.62* 3.22* 3.13*  CALCIUM 8.8 9.3 9.4 9.2 9.2  MG  --  2.7* 2.7*  --  2.5  PHOS  --  6.1* 5.6*  --  4.3   Estimated Creatinine Clearance: 19.7 mL/min (by C-G formula based on Cr of 3.13).   LIVER  Recent Labs Lab 11/19/2014 0921 11/14/2014 1939 12/09/2014 2020 11/14/14 0935 11/16/14 0239  AST  --   --  19  --   --   ALT  --   --  20  --   --   ALKPHOS  --   --  62  --   --   BILITOT  --   --  0.1*  --   --   PROT  --   --  5.6*  --   --   ALBUMIN  --   --  3.2*  --   --   INR 7.64* 8.45*  --  2.08* 1.58*     INFECTIOUS  Recent Labs Lab 11/14/2014 1242  LATICACIDVEN 0.8     ENDOCRINE CBG (last 3)  No results for input(s): GLUCAP in the last 72 hours.       IMAGING x48h Dg Chest Port 1 View  11/16/2014   CLINICAL DATA:  Acute respiratory failure  EXAM: PORTABLE CHEST - 1 VIEW  COMPARISON:  11/15/2014  FINDINGS: Cardiac shadow is stable. The endotracheal tube has advanced and now lies at the carina directed towards right mainstem bronchus. This should be withdrawn 2-3 cm. A left jugular central line is again noted in the left innominate vein. A nasogastric catheter seen within the stomach. Persistent left basilar infiltrate with associated effusion is noted. Patchy bilateral opacities are noted consistent with vascular congestion and mild pulmonary edema.  IMPRESSION: Increasing pulmonary edema.  Persistent left basilar changes are seen.  An endotracheal tube at the carina directed towards right mainstem bronchus. This should be withdrawn 2-3 cm.  These results will be called to the ordering clinician or representative by the Radiologist Assistant, and communication documented in the PACS or zVision Dashboard.    Electronically Signed   By: Alcide CleverMark  Lukens M.D.   On: 11/16/2014 07:51   Dg Chest Port 1 View  11/15/2014   CLINICAL DATA:  Intubation and bedside central venous catheter placement.  EXAM: PORTABLE CHEST - 1 VIEW 1621  hr:  COMPARISON:  Portable chest x-ray earlier same 0929 hr and dating back to 11/13/2013.  FINDINGS: Endotracheal tube tip in satisfactory position projecting approximately 4 cm above the carina. Left jugular central venous catheter tip projects over the upper SVC. No evidence of pneumothorax or mediastinal hematoma. Interval progression of interstitial and airspace pulmonary edema since earlier in the day. Stable dense left lower lobe consolidation. Increased focal airspace opacity in the inferior left upper lobe. Probable bilateral pleural effusions.  IMPRESSION: 1. Endotracheal tube tip in satisfactory position projecting approximately 4 cm above the carina. 2. Left jugular central venous catheter tip projects over the upper SVC. No acute complicating features. 3. Interval worsening of CHF and/or fluid overload since earlier in the day. 4. Interval worsening of a focal airspace opacity in the inferior left upper lobe, query developing pneumonia. 5. Stable dense left lower lobe atelectasis and/or pneumonia.   Electronically Signed   By: Hulan Saas M.D.   On: 11/15/2014 16:56   Dg Chest Port 1 View  11/15/2014   CLINICAL DATA:  Acute respiratory failure, shortness of Breath  EXAM: PORTABLE CHEST - 1 VIEW  COMPARISON:  11/14/2014  FINDINGS: Borderline cardiomegaly. Central mild vascular congestion. Worsening mild interstitial edema bilaterally. Persistent bilateral basilar atelectasis or infiltrate.  IMPRESSION: Worsening mild interstitial edema. Cardiomegaly. Persistent bilateral basilar atelectasis or infiltrate.   Electronically Signed   By: Natasha Mead M.D.   On: 11/15/2014 10:01   Dg Abd Portable 1v  11/15/2014   CLINICAL DATA:  Orogastric tube placement.  EXAM: PORTABLE ABDOMEN - 1 VIEW   COMPARISON:  None.  FINDINGS: The patient is rotated to the Right on today's radiograph, reducing diagnostic sensitivity and specificity. Distended stomach. The orogastric tube enters the stomach and extends to the right, likely into the vicinity of the stomach antrum. However, the distal extend to the orogastric tube is not included despite obtaining 2 separate images.  Airspace opacities are present at the lung bases.  IMPRESSION: 1. Orogastric tube enters the stomach although its distal tip is not included on today's attempted images. 2. Bibasilar airspace opacities. 3. Gastric distention.   Electronically Signed   By: Herbie Baltimore M.D.   On: 11/15/2014 17:02        ASSESSMENT / PLAN:  PULMONARY A: #Baseline  - Moderate COPD with chronic O2 dependence  - Pulmonary nodule - 7mm RLL seen on chest CT Feb 2015.  #Currently   - admitted with 09-Dec-2014 - epistaxis -resolved with FFP INR Correcion 12-09-14   - Intubated 11/15/14 due to progressive hypoxemic resp failure due to vlume overload, ALI and ? Retained blood     - 11/16/14: on 100% fio2, peep 8  P:    lasix but reduce to hypernatermia Full vent support - high peep DuoNebs scheduled. Incentive spirometry. CXR in AM. Hold outpatient advair, spiriva.  CARDIOVASCULAR A:  CAF > NSR presently Risk of hyperkalemia induced arrhythmias Chronic diastolic heart failure - on opd  Hx HTN, HLD OPD meds:  lasix, losartan, lopressor, nifedipine, rosuvastatin for now.    - NEgative balance and getting hypernatremic. In A Fib RVR  P:   IV lasix -but reduce Hold off on home bp meds Start cardizem gtt (avoiding amio due to ALI Risk)  RENAL   A:   AKI, nonoliguric Severe hyperkalemia Chronic edema Risk of volume overload   - improving creat but mild hypernatermia  P:   Continue lasix but reduice Hold off renal consult No more ARB  Monitor BMET intermittently Monitor I/Os Correct electrolytes as indicated Saline  lock   GASTROINTESTINAL A:   NO acute issues P:   SUP: IV PPI Start TF   HEMATOLOGIC A:   Warfarin induced coagulopathy - with septra interaction prior to admit Acute blood loss anemia Epistaxis VTE Prophylaxis   - s/p FFP complete November 28, 2014 and 11/14/14  P:  DVT px: SCD Monitor CBC intermittently Transfuse RBCs for Hgb < 7.0 or hemorrhagic shock - not indicated presently Probably a very poor candidate for long term warfarin  INFECTIOUS A:   Chronic venous stasis ulcers LLE Pulmonary infiltrates - doubt PNA P:   Check Pct and tracheal aspirate 11/16/2014 - and if abnormal start abx Blood Cx 01/05 >>> NEG Sputum Cx 01/05 >>> Follow off abx for now WOC consult requested  ENDOCRINE A:   Hyperglycemia without documented hx of DM P:   Consider SSI if glucose > 180.  NEUROLOGIC A:   Chronic pain   - on opd gabapentin and norco   11/16/14 on fent gtt  P:   RASS goal 0 to -2 Fentt gtt Versed prn DC gabapentin Hold opd norco.   DERM/MSK A: Chronic LLE distal venous stasis ulcer 2x 2cm . Suppsoed tohave opd surgery 11/16/2014 at [facility]  - per wound care     Family updated: Patient updated 11/14/14 and 11/15/2014 and 11/16/2014. Marland KitchenUpdated daughter Delice Bison on phone 11/16/2014 in detail. Guarded prognosis explained. She understood    Interdisciplinary Family Meeting v Palliative Care Meeting:  Due by: 11/20/14     GLOBAL VDRF follpwing epistaxis and blood products. ALI/Volume overload. In a Fib - start cardizem gtt. Admit isues of warfarin - septra coagulopathy and AKI - resolving    Critical Care Time devoted to patient care services described in this note is  35  Minutes. This time reflects time of care of this signee Dr Kalman Shan. This critical care time does not reflect procedure time, or teaching time or supervisory time of PA/NP/Med student/Med Resident etc but could involve care discussion time    Dr. Kalman Shan, M.D.,  [facility].C.P Pulmonary and Critical Care Medicine Staff Physician [facility]  Pulmonary and Critical Care Pager: [phone], If no answer or between  15:00h - 7:00h: call [phone]  11/16/2014 9:34 AM

## 2014-11-16 NOTE — Progress Notes (Signed)
Still wituh  A FiB RVR despite blood and amio gtt. On neo as well  Plan 500cc saline bolus If not better ?? Cards consult  Dr. Kalman ShanMurali Tyrisha Benninger, M.D., [employer].C.C.P Pulmonary and Critical Care Medicine Staff Physician [facility] Pulmonary and Critical Care Pager: [phone], If no answer or between  15:00h - 7:00h: call [phone]  11/16/2014 8:03 PM

## 2014-11-16 NOTE — Progress Notes (Signed)
eLink Physician-Brief Progress Note Patient Name: Paula MartesCarolyn M Fletcher DOB: 1934-11-24 MRN: [id number]   Date of Service  11/16/2014  HPI/Events of Note  Hypotensive on cardizem.  Oliguric.  A fib with RVR.   eICU Interventions  Will give NS fluid bolus.   Will start amiodarone, and d/c cardizem      Intervention Category Major Interventions: Other:  Shalaine Payson 11/16/2014, 3:40 PM

## 2014-11-16 NOTE — Progress Notes (Signed)
D/w Dr. Marchelle Gearingamaswamy regarding patient's HR continuing in 130's despite amiodarone, bolus and blood. Order received for 500cc bolus. Will monitor.

## 2014-11-16 NOTE — Progress Notes (Signed)
Amiodarone Drug - Drug Interaction Consult Note  Recommendations:  Continue therapy as ordered  Amiodarone is metabolized by the cytochrome P450 system and therefore has the potential to cause many drug interactions. Amiodarone has an average plasma half-life of 50 days (range 20 to 100 days).   There is potential for drug interactions to occur several weeks or months after stopping treatment and the onset of drug interactions may be slow after initiating amiodarone.   []  Statins: Increased risk of myopathy. Simvastatin- restrict dose to 20mg  daily. Other statins: counsel patients to report any muscle pain or weakness immediately.  []  Anticoagulants: Amiodarone can increase anticoagulant effect. Consider warfarin dose reduction. Patients should be monitored closely and the dose of anticoagulant altered accordingly, remembering that amiodarone levels take several weeks to stabilize.  []  Antiepileptics: Amiodarone can increase plasma concentration of phenytoin, the dose should be reduced. Note that small changes in phenytoin dose can result in large changes in levels. Monitor patient and counsel on signs of toxicity.  []  Beta blockers: increased risk of bradycardia, AV block and myocardial depression. Sotalol - avoid concomitant use.  []   Calcium channel blockers (diltiazem and verapamil): increased risk of bradycardia, AV block and myocardial depression.  []   Cyclosporine: Amiodarone increases levels of cyclosporine. Reduced dose of cyclosporine is recommended.  []  Digoxin dose should be halved when amiodarone is started.  []  Diuretics: increased risk of cardiotoxicity if hypokalemia occurs.  []  Oral hypoglycemic agents (glyburide, glipizide, glimepiride): increased risk of hypoglycemia. Patient's glucose levels should be monitored closely when initiating amiodarone therapy.   []  Drugs that prolong the QT interval:  Torsades de pointes risk may be increased with concurrent use - avoid if  possible.  Monitor QTc, also keep magnesium/potassium WNL if concurrent therapy can't be avoided. Marland Kitchen. Antibiotics: e.g. fluoroquinolones, erythromycin. . Antiarrhythmics: e.g. quinidine, procainamide, disopyramide, sotalol. . Antipsychotics: e.g. phenothiazines, haloperidol.  . Lithium, tricyclic antidepressants, and methadone.  Thank You,  Chelsea Aushuy D. Laney Potashang, PharmD, BCPS Pager:  [phone] 11/16/2014, 3:42 PM

## 2014-11-17 ENCOUNTER — Inpatient Hospital Stay (HOSPITAL_COMMUNITY): Payer: Medicare FFS

## 2014-11-17 LAB — POCT I-STAT 3, ART BLOOD GAS (G3+)
Acid-base deficit: 2 mmol/L (ref 0.0–2.0)
Bicarbonate: 21.3 mEq/L (ref 20.0–24.0)
O2 Saturation: 98 %
PH ART: 7.462 — AB (ref 7.350–7.450)
Patient temperature: 98.6
TCO2: 22 mmol/L (ref 0–100)
pCO2 arterial: 29.9 mmHg — ABNORMAL LOW (ref 35.0–45.0)
pO2, Arterial: 103 mmHg — ABNORMAL HIGH (ref 80.0–100.0)

## 2014-11-17 LAB — CBC
HCT: 25.7 % — ABNORMAL LOW (ref 36.0–46.0)
HEMATOCRIT: 25.2 % — AB (ref 36.0–46.0)
HEMOGLOBIN: 7.8 g/dL — AB (ref 12.0–15.0)
Hemoglobin: 7.7 g/dL — ABNORMAL LOW (ref 12.0–15.0)
MCH: 30.9 pg (ref 26.0–34.0)
MCH: 30.8 pg (ref 26.0–34.0)
MCHC: 30.6 g/dL (ref 30.0–36.0)
MCHC: 30.4 g/dL (ref 30.0–36.0)
MCV: 101.2 fL — AB (ref 78.0–100.0)
MCV: 101.6 fL — ABNORMAL HIGH (ref 78.0–100.0)
PLATELETS: 143 10*3/uL — AB (ref 150–400)
Platelets: 148 10*3/uL — ABNORMAL LOW (ref 150–400)
RBC: 2.49 MIL/uL — AB (ref 3.87–5.11)
RBC: 2.53 MIL/uL — ABNORMAL LOW (ref 3.87–5.11)
RDW: 17.6 % — ABNORMAL HIGH (ref 11.5–15.5)
RDW: 17.5 % — ABNORMAL HIGH (ref 11.5–15.5)
WBC: 9.2 10*3/uL (ref 4.0–10.5)
WBC: 10.5 10*3/uL (ref 4.0–10.5)

## 2014-11-17 LAB — GLUCOSE, CAPILLARY
GLUCOSE-CAPILLARY: 108 mg/dL — AB (ref 70–99)
GLUCOSE-CAPILLARY: 114 mg/dL — AB (ref 70–99)
Glucose-Capillary: 124 mg/dL — ABNORMAL HIGH (ref 70–99)
Glucose-Capillary: 115 mg/dL — ABNORMAL HIGH (ref 70–99)
Glucose-Capillary: 128 mg/dL — ABNORMAL HIGH (ref 70–99)
Glucose-Capillary: 146 mg/dL — ABNORMAL HIGH (ref 70–99)

## 2014-11-17 LAB — BASIC METABOLIC PANEL
Anion gap: 11 (ref 5–15)
BUN: 113 mg/dL — ABNORMAL HIGH (ref 6–23)
CALCIUM: 8.8 mg/dL (ref 8.4–10.5)
CHLORIDE: 114 meq/L — AB (ref 96–112)
CO2: 22 mmol/L (ref 19–32)
CREATININE: 3.11 mg/dL — AB (ref 0.50–1.10)
GFR calc Af Amer: 16 mL/min — ABNORMAL LOW (ref >90)
GFR calc non Af Amer: 13 mL/min — ABNORMAL LOW (ref >90)
Glucose, Bld: 124 mg/dL — ABNORMAL HIGH (ref 70–99)
Potassium: 4.8 mmol/L (ref 3.5–5.1)
Sodium: 147 mmol/L — ABNORMAL HIGH (ref 135–145)

## 2014-11-17 LAB — CBC WITH DIFFERENTIAL/PLATELET
BASOS ABS: 0 10*3/uL (ref 0.0–0.1)
Basophils Relative: 0 % (ref 0–1)
EOS PCT: 1 % (ref 0–5)
Eosinophils Absolute: 0.1 10*3/uL (ref 0.0–0.7)
HEMATOCRIT: 24.7 % — AB (ref 36.0–46.0)
Hemoglobin: 7.7 g/dL — ABNORMAL LOW (ref 12.0–15.0)
LYMPHS ABS: 0.8 10*3/uL (ref 0.7–4.0)
Lymphocytes Relative: 9 % — ABNORMAL LOW (ref 12–46)
MCH: 31.6 pg (ref 26.0–34.0)
MCHC: 31.2 g/dL (ref 30.0–36.0)
MCV: 101.2 fL — ABNORMAL HIGH (ref 78.0–100.0)
Monocytes Absolute: 0.9 10*3/uL (ref 0.1–1.0)
Monocytes Relative: 10 % (ref 3–12)
Neutro Abs: 7.5 10*3/uL (ref 1.7–7.7)
Neutrophils Relative %: 80 % — ABNORMAL HIGH (ref 43–77)
PLATELETS: 147 10*3/uL — AB (ref 150–400)
RBC: 2.44 MIL/uL — AB (ref 3.87–5.11)
RDW: 17.6 % — ABNORMAL HIGH (ref 11.5–15.5)
WBC: 9.4 10*3/uL (ref 4.0–10.5)

## 2014-11-17 LAB — BLOOD GAS, ARTERIAL
Acid-base deficit: 3.5 mmol/L — ABNORMAL HIGH (ref 0.0–2.0)
Bicarbonate: 21.8 mEq/L (ref 20.0–24.0)
Drawn by: 41977
FIO2: 1 %
LHR: 16 {breaths}/min
MECHVT: 480 mL
O2 Saturation: 97.4 %
PCO2 ART: 44.5 mmHg (ref 35.0–45.0)
PEEP/CPAP: 10 cmH2O
PH ART: 7.31 — AB (ref 7.350–7.450)
Patient temperature: 98.6
TCO2: 23.1 mmol/L (ref 0–100)
pO2, Arterial: 106 mmHg — ABNORMAL HIGH (ref 80.0–100.0)

## 2014-11-17 LAB — TYPE AND SCREEN
ABO/RH(D): AB POS
Antibody Screen: NEGATIVE
UNIT DIVISION: 0

## 2014-11-17 LAB — PHOSPHORUS: PHOSPHORUS: 3.7 mg/dL (ref 2.3–4.6)

## 2014-11-17 LAB — MAGNESIUM: MAGNESIUM: 2.4 mg/dL (ref 1.5–2.5)

## 2014-11-17 LAB — PROCALCITONIN: Procalcitonin: 0.19 ng/mL

## 2014-11-17 MED ORDER — FREE WATER
200.0000 mL | Freq: Four times a day (QID) | Status: DC
  Administered 2014-11-17 – 2014-11-18 (×4): 200 mL

## 2014-11-17 MED ORDER — FENTANYL CITRATE 0.05 MG/ML IJ SOLN
50.0000 ug | INTRAMUSCULAR | Status: DC | PRN
  Administered 2014-11-17 – 2014-11-19 (×5): 50 ug via INTRAVENOUS
  Filled 2014-11-17 (×5): qty 2

## 2014-11-17 MED ORDER — DEXMEDETOMIDINE HCL IN NACL 400 MCG/100ML IV SOLN
0.0000 ug/kg/h | INTRAVENOUS | Status: DC
  Administered 2014-11-17: 0.5 ug/kg/h via INTRAVENOUS
  Administered 2014-11-17 (×2): 0.6 ug/kg/h via INTRAVENOUS
  Administered 2014-11-17: 0.4 ug/kg/h via INTRAVENOUS
  Administered 2014-11-18: 0.5 ug/kg/h via INTRAVENOUS
  Administered 2014-11-18 (×4): 0.6 ug/kg/h via INTRAVENOUS
  Administered 2014-11-18: 0.5 ug/kg/h via INTRAVENOUS
  Administered 2014-11-18 (×2): 0.6 ug/kg/h via INTRAVENOUS
  Administered 2014-11-19 (×2): 0.8 ug/kg/h via INTRAVENOUS
  Administered 2014-11-19 (×2): 0.6 ug/kg/h via INTRAVENOUS
  Filled 2014-11-17 (×13): qty 50
  Filled 2014-11-17: qty 100
  Filled 2014-11-17 (×2): qty 50

## 2014-11-17 MED ORDER — AMIODARONE IV BOLUS ONLY 150 MG/100ML
150.0000 mg | Freq: Once | INTRAVENOUS | Status: AC
Start: 2014-11-17 — End: 2014-11-17
  Filled 2014-11-17: qty 100

## 2014-11-17 MED ORDER — METOPROLOL TARTRATE 1 MG/ML IV SOLN
2.5000 mg | INTRAVENOUS | Status: DC
Start: 2014-11-17 — End: 2014-11-18
  Administered 2014-11-17 – 2014-11-18 (×7): 2.5 mg via INTRAVENOUS
  Filled 2014-11-17 (×12): qty 5

## 2014-11-17 NOTE — Progress Notes (Signed)
Arterial line drawl/ABG results for 1424 hrs. were scanned with wrong pt. sticker and ARE NOT, ([id number] Fletcher, Paula Clevelandarolyn M.) results, Point of Care edit sheet to be placed for Lab results correction on E chart.

## 2014-11-17 NOTE — Progress Notes (Signed)
PULMONARY / CRITICAL CARE MEDICINE   Name: Paula Fletcher MRN: [id number] DOB: [birth year]    ADMISSION DATE:  26-Nov-2014 CONSULTATION DATE:  11/17/2014  REFERRING MD :  [facility]  CHIEF COMPLAINT:  Nose bleed, generalized weakness, SOB  INITIAL PRESENTATION:   .Paula Fletcher is a 80 y.o. F with PMH as outlined below including 4L O2 dependent COPD and A.fib on chronic coumadin, She presented to [facility] on 01/05 for epistaxis, generalized weakness, and SOB.  Epistaxis began roughly one day prior at 1230 and resolved spontaneously prior to re-starting again shortly thereafter.  She reports that bleeding was "like a stream", primarily out of the right nare.  Also believes that some blood went down her throat. In ED, she was found to have temp of 94 rectally, supratherapeutic INR at 7.64, anemia with Hgb 10, acute renal failure with SCr 3.90, hyperkalemia with K of 7.  In addition, she was started on BiPAP which was later changed to [facility].  Request was made to transfer to [facility] ICU for concerns that pt may deteriorate.  PCCM consulted for admission.  On arrival to [facility], pt noted to still be slowly bleeding from b/l nares as well as new bleeding from foley site.   STUDIES:  01/05 CT Head: no acute abnormality, opacification of right nasal cavity and nasopharynx, may be related to epistaxis. 01/05 - admitted to [facility] ICU 11/14/14: Still with intermittent very mild slow epistaxis. Hungry . Making urine.  ENT consult - Conservative Rx. Repeat FFP 11/15/14 - progressive hypoxemic resp faiure despite lasix ,INTUBATED +. CENTRAL LINE +   SUBJECTIVE/OVERNIGHT/INTERVAL HX Still in AF w/ RVR    VITAL SIGNS: Temp:  [98.7 F (37.1 C)-100 F (37.8 C)] 98.9 F (37.2 C) (01/09 0749) Pulse Rate:  [55-154] 121 (01/09 0800) Resp:  [12-29] 28 (01/09 0800) BP: (69-136)/(26-108) 107/57 mmHg (01/09 0800) SpO2:  [81 %-100 %] 100 % (01/09 0845) FiO2 (%):  [80 %-100 %] 80 % (01/09 0845) Weight:  [116.5 kg (256 lb 13.4  oz)] 116.5 kg (256 lb 13.4 oz) (01/09 0500) HEMODYNAMICS: CVP:  [10 mmHg-22 mmHg] 10 mmHg VENTILATOR SETTINGS: Vent Mode:  [-] PRVC FiO2 (%):  [80 %-100 %] 80 % Set Rate:  [16 bmp] 16 bmp Vt Set:  [480 mL] 480 mL PEEP:  [10 cmH20] 10 cmH20 Plateau Pressure:  [20 cmH20-27 cmH20] 27 cmH20 INTAKE / OUTPUT: Intake/Output      01/08 0701 - 01/09 0700 01/09 0701 - 01/10 0700   P.O.     I.V. (mL/kg) 1725.8 (14.8) 36 (0.3)   Blood 368    NG/GT 28.8    IV Piggyback 500    Total Intake(mL/kg) 2622.6 (22.5) 36 (0.3)   Urine (mL/kg/hr) 640 (0.2)    Total Output 640     Net +1982.6 +36          PHYSICAL EXAMINATION: General: WDWN female, in NAD. Neuro: A&O x 3, CAM-ICU neg for delirium when awake HEENT: ET tube +. No active bleeding oropharynx. Cardiovascular: RRR, no M/R/G.  Lungs: scattered rhonchi  Abdomen: BS x 4, soft, NT/ND.  Ext: Chronic stasis changes, trace b/l edema, multiple superficial ulcers on LLE. In ACE wrap  Skin: Intact, warm, no rashes.  LABS: PULMONARY  Recent Labs Lab 26-Nov-2014 1119 11/15/14 1729 11/17/14 0607  PHART  --  7.231* 7.310*  PCO2ART  --  53.8* 44.5  PO2ART  --  102.0* 106.0*  HCO3  --  22.6 21.8  TCO2 17 24 23.1  O2SAT  --  96.0 97.4    CBC  Recent Labs Lab 11/16/14 1434 11/16/14 2110 11/17/14 0330  HGB 7.1* 7.8* 7.7*  7.7*  HCT 22.6* 25.3* 24.7*  25.2*  WBC 8.2 9.7 9.4  9.2  PLT 134* 149* 147*  143*    COAGULATION  Recent Labs Lab 11/26/2014 0921 11/11/2014 1939 11/14/14 0935 11/16/14 0239  INR 7.64* 8.45* 2.08* 1.58*    CARDIAC    Recent Labs Lab 11/14/14 1100 11/14/14 2000 11/15/14 0145  TROPONINI 0.04* 0.17* 0.66*   No results for input(s): PROBNP in the last 168 hours.   CHEMISTRY  Recent Labs Lab 11/14/14 0935 11/15/14 0145 11/15/14 1730 11/16/14 0239 11/17/14 0330  NA 144 146* 147* 148* 147*  K 5.0 5.8* 4.6 4.7 4.8  CL 111 112 115* 117* 114*  CO2 GLUCOSE 91 147* 162* 118*  124*  BUN 105* 103* 96* 98* 113*  CREATININE 3.89* 3.62* 3.22* 3.13* 3.11*  CALCIUM 9.3 9.4 9.2 9.2 8.8  MG 2.7* 2.7*  --  2.5 2.4  PHOS 6.1* 5.6*  --  4.3 3.7   Estimated Creatinine Clearance: 19.7 mL/min (by C-G formula based on Cr of 3.11).   LIVER  Recent Labs Lab 11/29/2014 0921 11/09/2014 1939 11/29/2014 2020 11/14/14 0935 11/16/14 0239  AST  --   --  19  --   --   ALT  --   --  20  --   --   ALKPHOS  --   --  62  --   --   BILITOT  --   --  0.1*  --   --   PROT  --   --  5.6*  --   --   ALBUMIN  --   --  3.2*  --   --   INR 7.64* 8.45*  --  2.08* 1.58*    INFECTIOUS  Recent Labs Lab 11/27/2014 1242 11/16/14 1030 11/17/14 0330  LATICACIDVEN 0.8  --   --   PROCALCITON  --  0.18 0.19     ENDOCRINE CBG (last 3)   Recent Labs  11/17/14 0004 11/17/14 0340 11/17/14 0745  GLUCAP 114* 108* 124*    IMAGING x48h Dg Chest Port 1 View  11/17/2014   CLINICAL DATA:  Endotracheal tube placement.  Subsequent encounter.  EXAM: PORTABLE CHEST - 1 VIEW  COMPARISON:  Chest radiograph performed 11/16/2014  FINDINGS: The patient's endotracheal tube is seen ending 1-2 cm above the carina. This could be retracted 1-2 cm, as deemed clinically appropriate.  The enteric tube is seen extending below the diaphragm. A left IJ line is noted ending about the mid SVC.  The lungs are hypoexpanded. Persistent bilateral airspace opacities are seen, with underlying interstitial prominence and vascular congestion. A small left pleural effusion is again noted. Airspace opacities are mildly improved from the prior study. No pneumothorax is identified.  The cardiomediastinal silhouette is mildly enlarged. No acute osseous abnormalities are seen.  IMPRESSION: 1. Endotracheal tube seen ending 1-2 cm above the carina. This could be retracted by 1-2 cm, as deemed clinically appropriate. 2. Lungs hypoexpanded. Persistent bilateral airspace opacities, with underlying interstitial prominence. Small left pleural  effusion again noted. Airspace opacities are mildly improved from the prior study. 3. Vascular congestion and mild cardiomegaly noted.  These results were called by telephone at the time of interpretation on 11/17/2014 at 7:32 am to Nursing on [id number], who verbally acknowledged these results.   Electronically  Signed   By: Roanna Raider M.D.   On: 11/17/2014 07:32   Dg Chest Port 1 View  11/16/2014   CLINICAL DATA:  Acute respiratory failure  EXAM: PORTABLE CHEST - 1 VIEW  COMPARISON:  11/15/2014  FINDINGS: Cardiac shadow is stable. The endotracheal tube has advanced and now lies at the carina directed towards right mainstem bronchus. This should be withdrawn 2-3 cm. A left jugular central line is again noted in the left innominate vein. A nasogastric catheter seen within the stomach. Persistent left basilar infiltrate with associated effusion is noted. Patchy bilateral opacities are noted consistent with vascular congestion and mild pulmonary edema.  IMPRESSION: Increasing pulmonary edema.  Persistent left basilar changes are seen.  An endotracheal tube at the carina directed towards right mainstem bronchus. This should be withdrawn 2-3 cm.  These results will be called to the ordering clinician or representative by the Radiologist Assistant, and communication documented in the PACS or zVision Dashboard.   Electronically Signed   By: Alcide Clever M.D.   On: 11/16/2014 07:51   Dg Chest Port 1 View  11/15/2014   CLINICAL DATA:  Intubation and bedside central venous catheter placement.  EXAM: PORTABLE CHEST - 1 VIEW 1621 hr:  COMPARISON:  Portable chest x-ray earlier same 0929 hr and dating back to 11/13/2013.  FINDINGS: Endotracheal tube tip in satisfactory position projecting approximately 4 cm above the carina. Left jugular central venous catheter tip projects over the upper SVC. No evidence of pneumothorax or mediastinal hematoma. Interval progression of interstitial and airspace pulmonary edema since earlier in  the day. Stable dense left lower lobe consolidation. Increased focal airspace opacity in the inferior left upper lobe. Probable bilateral pleural effusions.  IMPRESSION: 1. Endotracheal tube tip in satisfactory position projecting approximately 4 cm above the carina. 2. Left jugular central venous catheter tip projects over the upper SVC. No acute complicating features. 3. Interval worsening of CHF and/or fluid overload since earlier in the day. 4. Interval worsening of a focal airspace opacity in the inferior left upper lobe, query developing pneumonia. 5. Stable dense left lower lobe atelectasis and/or pneumonia.   Electronically Signed   By: Hulan Saas M.D.   On: 11/15/2014 16:56   Dg Chest Port 1 View  11/15/2014   CLINICAL DATA:  Acute respiratory failure, shortness of Breath  EXAM: PORTABLE CHEST - 1 VIEW  COMPARISON:  11/14/2014  FINDINGS: Borderline cardiomegaly. Central mild vascular congestion. Worsening mild interstitial edema bilaterally. Persistent bilateral basilar atelectasis or infiltrate.  IMPRESSION: Worsening mild interstitial edema. Cardiomegaly. Persistent bilateral basilar atelectasis or infiltrate.   Electronically Signed   By: Natasha Mead M.D.   On: 11/15/2014 10:01   Dg Abd Portable 1v  11/15/2014   CLINICAL DATA:  Orogastric tube placement.  EXAM: PORTABLE ABDOMEN - 1 VIEW  COMPARISON:  None.  FINDINGS: The patient is rotated to the Right on today's radiograph, reducing diagnostic sensitivity and specificity. Distended stomach. The orogastric tube enters the stomach and extends to the right, likely into the vicinity of the stomach antrum. However, the distal extend to the orogastric tube is not included despite obtaining 2 separate images.  Airspace opacities are present at the lung bases.  IMPRESSION: 1. Orogastric tube enters the stomach although its distal tip is not included on today's attempted images. 2. Bibasilar airspace opacities. 3. Gastric distention.   Electronically  Signed   By: Herbie Baltimore M.D.   On: 11/15/2014 17:02    ASSESSMENT / PLAN:  PULMONARY A: Acute on Chronic Hypoxic Respiratory failure: favor aspiration w/ ALI vs TRALI +/- element of edema: PCT neg w/ reassuring sputum off abx  Moderate COPD Pulmonary nodule - 7mm RLL seen on chest CT Feb 2015.  P:   Full vent support - high peep/ ARDS approach DuoNebs scheduled. CXR in AM. See ID section   CARDIOVASCULAR A:  AF w/ RVR  Risk of hyperkalemia induced arrhythmias Chronic diastolic heart failure - on opd  Hx HTN, HLD OPD meds:  lasix, losartan, lopressor, nifedipine, rosuvastatin for now.  P:  amio gtt-->short term given concern about ALI No anticoagulation  Add scheduled BB w/ neo to support pressure while on sedation  Aim for euvolemia for the next 24 hrs   RENAL A:   AKI, nonoliguric-->improving  Hypernatremia  Severe hyperkalemia-->improved  Chronic edema Risk of volume overload  P:   Hold lasix No more ARB Monitor BMET intermittently Monitor I/Os Free water w goal for euvolemia today  Saline lock   GASTROINTESTINAL A:   NO acute issues P:   SUP: IV PPI Cont tubefeeds   HEMATOLOGIC A:   Warfarin induced coagulopathy - with septra interaction prior to admit-->INR better  Acute blood loss anemia-->hgb stable  Epistaxis VTE Prophylaxis  - s/p FFP complete 12/01/2014 and 11/14/14  P:  DVT px: SCD Monitor CBC intermittently Transfuse RBCs for Hgb < 7.0 or hemorrhagic shock - not indicated presently Probably a very poor candidate for long term warfarin  INFECTIOUS A:   Chronic venous stasis ulcers LLE Pulmonary infiltrates - worrisome for aspiration PNA  P:   Blood Cx 01/05 >>> NEG Sputum Cx 01/05: few wbc>>> Follow off abx for now WOC consult requested  ENDOCRINE A:   Hyperglycemia without documented hx of DM P:   Consider SSI if glucose > 180.  NEUROLOGIC A:   Chronic pain   - on opd gabapentin and norco 11/16/14 on fent gtt  P:    RASS goal 0 to -2 Transition for precedex Avoid benzos if able  DERM/MSK A: Chronic LLE distal venous stasis ulcer 2x 2cm . Suppsoed tohave opd surgery 11/16/2014 at [facility]  - per wound care  Family updated: Patient updated 11/14/14 and 11/15/2014 and 11/16/2014. Updated daughter Delice Bison on phone 11/16/2014 in detail. Guarded prognosis explained. She understood  Interdisciplinary Family Meeting v Palliative Care Meeting:  Due by: 11/20/14  GLOBAL VDRF following epistaxis and blood products. ALI/Volume overload. In a Fib - start cardizem gtt. Admit issues of warfarin - septra coagulopathy and AKI - resolving. ALI appears stable. PCT neg and sputum reassuring. Biggest issue today is AF. Will add scheduled BB, got a bolus of amio, but would like to avoid this long term given risk of ALI. Today goal as follows: a) keep euvolemic b) try to get rate better controlled (have added scheduled BB) c) transition off fentanyl gtt, try precedex, d) cont neo to support MAP, and cont supportive care. She seems to be making slow progress. Will see how her CXR looks in am.   Simonne Martinet ACNP-BC [facility] Pulmonary/Critical Care Pager # [phone] OR # [phone] if no answer 11/17/2014 9:03 AM   Attending Note:  I have examined patient, reviewed labs, studies and notes. I have discussed the case with Kreg Shropshire, and I agree with the data and plans as amended above. Pt appears stable on MV. Supporting for ALI after severe epistaxis. Will work towards euvolemia, adjust sedation, attempt to work towards SBT  and extubation. Not in position to extubate today.  Independent critical care time is 40 minutes.   Levy Pupaobert Byrum, MD, PhD 11/17/2014, 1:14 PM [place] [facility] [phone] or if no answer [phone]

## 2014-11-18 ENCOUNTER — Inpatient Hospital Stay (HOSPITAL_COMMUNITY): Payer: Medicare FFS

## 2014-11-18 LAB — BLOOD GAS, ARTERIAL
Acid-base deficit: 1.4 mmol/L (ref 0.0–2.0)
Bicarbonate: 22.4 mEq/L (ref 20.0–24.0)
DRAWN BY: 252031
FIO2: 0.8 %
LHR: 16 {breaths}/min
MECHVT: 410 mL
O2 SAT: 83.6 %
PEEP: 10 cmH2O
PH ART: 7.407 (ref 7.350–7.450)
Patient temperature: 101.1
TCO2: 23.4 mmol/L (ref 0–100)
pCO2 arterial: 36.9 mmHg (ref 35.0–45.0)
pO2, Arterial: 52.5 mmHg — ABNORMAL LOW (ref 80.0–100.0)

## 2014-11-18 LAB — PHOSPHORUS: Phosphorus: 3.2 mg/dL (ref 2.3–4.6)

## 2014-11-18 LAB — COMPREHENSIVE METABOLIC PANEL
ALBUMIN: 2.5 g/dL — AB (ref 3.5–5.2)
ALT: 23 U/L (ref 0–35)
ANION GAP: 8 (ref 5–15)
AST: 32 U/L (ref 0–37)
Alkaline Phosphatase: 50 U/L (ref 39–117)
BUN: 123 mg/dL — ABNORMAL HIGH (ref 6–23)
CALCIUM: 9 mg/dL (ref 8.4–10.5)
CHLORIDE: 115 meq/L — AB (ref 96–112)
CO2: 24 mmol/L (ref 19–32)
Creatinine, Ser: 2.73 mg/dL — ABNORMAL HIGH (ref 0.50–1.10)
GFR calc Af Amer: 18 mL/min — ABNORMAL LOW (ref >90)
GFR calc non Af Amer: 16 mL/min — ABNORMAL LOW (ref >90)
Glucose, Bld: 164 mg/dL — ABNORMAL HIGH (ref 70–99)
POTASSIUM: 4.2 mmol/L (ref 3.5–5.1)
Sodium: 147 mmol/L — ABNORMAL HIGH (ref 135–145)
Total Bilirubin: 0.5 mg/dL (ref 0.3–1.2)
Total Protein: 5.7 g/dL — ABNORMAL LOW (ref 6.0–8.3)

## 2014-11-18 LAB — CULTURE, RESPIRATORY W GRAM STAIN: Special Requests: NORMAL

## 2014-11-18 LAB — GLUCOSE, CAPILLARY
GLUCOSE-CAPILLARY: 129 mg/dL — AB (ref 70–99)
GLUCOSE-CAPILLARY: 143 mg/dL — AB (ref 70–99)
GLUCOSE-CAPILLARY: 149 mg/dL — AB (ref 70–99)
GLUCOSE-CAPILLARY: 152 mg/dL — AB (ref 70–99)
GLUCOSE-CAPILLARY: 156 mg/dL — AB (ref 70–99)
Glucose-Capillary: 137 mg/dL — ABNORMAL HIGH (ref 70–99)
Glucose-Capillary: 145 mg/dL — ABNORMAL HIGH (ref 70–99)

## 2014-11-18 LAB — CBC WITH DIFFERENTIAL/PLATELET
Basophils Absolute: 0 10*3/uL (ref 0.0–0.1)
Basophils Relative: 0 % (ref 0–1)
Eosinophils Absolute: 0 10*3/uL (ref 0.0–0.7)
Eosinophils Relative: 1 % (ref 0–5)
HCT: 25.2 % — ABNORMAL LOW (ref 36.0–46.0)
Hemoglobin: 7.8 g/dL — ABNORMAL LOW (ref 12.0–15.0)
LYMPHS PCT: 7 % — AB (ref 12–46)
Lymphs Abs: 0.6 10*3/uL — ABNORMAL LOW (ref 0.7–4.0)
MCH: 31 pg (ref 26.0–34.0)
MCHC: 31 g/dL (ref 30.0–36.0)
MCV: 100 fL (ref 78.0–100.0)
Monocytes Absolute: 0.6 10*3/uL (ref 0.1–1.0)
Monocytes Relative: 8 % (ref 3–12)
Neutro Abs: 7.3 10*3/uL (ref 1.7–7.7)
Neutrophils Relative %: 84 % — ABNORMAL HIGH (ref 43–77)
Platelets: 147 10*3/uL — ABNORMAL LOW (ref 150–400)
RBC: 2.52 MIL/uL — ABNORMAL LOW (ref 3.87–5.11)
RDW: 16.9 % — AB (ref 11.5–15.5)
WBC: 8.6 10*3/uL (ref 4.0–10.5)

## 2014-11-18 LAB — MAGNESIUM: Magnesium: 2.2 mg/dL (ref 1.5–2.5)

## 2014-11-18 LAB — CULTURE, RESPIRATORY

## 2014-11-18 LAB — PROCALCITONIN: PROCALCITONIN: 0.46 ng/mL

## 2014-11-18 MED ORDER — FUROSEMIDE 10 MG/ML IJ SOLN
40.0000 mg | Freq: Two times a day (BID) | INTRAMUSCULAR | Status: AC
  Administered 2014-11-18 (×2): 40 mg via INTRAVENOUS
  Filled 2014-11-18: qty 4

## 2014-11-18 MED ORDER — ACETAMINOPHEN 160 MG/5ML PO SOLN
650.0000 mg | Freq: Four times a day (QID) | ORAL | Status: DC | PRN
  Administered 2014-11-18: 650 mg
  Filled 2014-11-18: qty 20.3

## 2014-11-18 MED ORDER — FREE WATER
200.0000 mL | Status: DC
Start: 2014-11-18 — End: 2014-11-19
  Administered 2014-11-18 – 2014-11-19 (×6): 200 mL

## 2014-11-18 MED ORDER — METOPROLOL TARTRATE 1 MG/ML IV SOLN
5.0000 mg | INTRAVENOUS | Status: DC
  Administered 2014-11-18 – 2014-11-22 (×13): 5 mg via INTRAVENOUS
  Filled 2014-11-18 (×34): qty 5

## 2014-11-18 NOTE — Progress Notes (Signed)
PULMONARY / CRITICAL CARE MEDICINE   Name: Paula Fletcher MRN: [id number] DOB: [birth year]    ADMISSION DATE:  11/15/2014 CONSULTATION DATE:  11/18/2014  REFERRING MD :  [facility]  CHIEF COMPLAINT:  Nose bleed, generalized weakness, SOB  INITIAL PRESENTATION:   .Paula Fletcher is a 80 y.o. F with PMH as outlined below including 4L O2 dependent COPD and A.fib on chronic coumadin, She presented to [facility] on 01/05 for epistaxis, generalized weakness, and SOB.  Epistaxis began roughly one day prior at 1230 and resolved spontaneously prior to re-starting again shortly thereafter.  She reports that bleeding was "like a stream", primarily out of the right nare.  Also believes that some blood went down her throat. In ED, she was found to have temp of 94 rectally, supratherapeutic INR at 7.64, anemia with Hgb 10, acute renal failure with SCr 3.90, hyperkalemia with K of 7.  In addition, she was started on BiPAP which was later changed to [facility].  Request was made to transfer to [facility] ICU for concerns that pt may deteriorate.  PCCM consulted for admission.  On arrival to [facility], pt noted to still be slowly bleeding from b/l nares as well as new bleeding from foley site.   STUDIES:  01/05 CT Head: no acute abnormality, opacification of right nasal cavity and nasopharynx, may be related to epistaxis. 01/05 - admitted to [facility] ICU 11/14/14: Still with intermittent very mild slow epistaxis. Hungry . Making urine.  ENT consult - Conservative Rx. Repeat FFP 11/15/14 - progressive hypoxemic resp faiure despite lasix ,INTUBATED +. CENTRAL LINE + 1/10: requiring higher FIO2/Peep/ renal fxn improved. Off pressors.   SUBJECTIVE/OVERNIGHT/INTERVAL HX Sedated. Off pressors.  VITAL SIGNS: Temp:  [99.1 F (37.3 C)-101.5 F (38.6 C)] 99.6 F (37.6 C) (01/10 0800) Pulse Rate:  [70-139] 129 (01/10 0800) Resp:  [15-35] 35 (01/10 0800) BP: (87-153)/(26-98) 118/65 mmHg (01/10 0800) SpO2:  [88 %-99 %] 99 % (01/10 0800) FiO2  (%):  [70 %-80 %] 80 % (01/10 0745) Weight:  [117.2 kg (258 lb 6.1 oz)] 117.2 kg (258 lb 6.1 oz) (01/10 0400) HEMODYNAMICS: CVP:  [11 mmHg-21 mmHg] 12 mmHg VENTILATOR SETTINGS: Vent Mode:  [-] PRVC FiO2 (%):  [70 %-80 %] 80 % Set Rate:  [16 bmp] 16 bmp Vt Set:  [410 mL] 410 mL PEEP:  [10 cmH20-12 cmH20] 12 cmH20 Plateau Pressure:  [13 cmH20-26 cmH20] 26 cmH20 INTAKE / OUTPUT: Intake/Output      01/09 0701 - 01/10 0700 01/10 0701 - 01/11 0700   I.V. (mL/kg) 1027.8 (8.8)    Blood     NG/GT 1115    IV Piggyback     Total Intake(mL/kg) 2142.8 (18.3)    Urine (mL/kg/hr) 1695 (0.6)    Total Output 1695     Net +447.8            PHYSICAL EXAMINATION: General: WDWN female, in NAD. Neuro: sedated. Does not f/c on wake-up, but reaches briskly for tubes.  HEENT: ET tube +. No active bleeding oropharynx. Cardiovascular: RRR, no M/R/G.  Lungs: scattered rhonchi, rales  Abdomen: BS x 4, soft, NT/ND.  Ext: Chronic stasis changes, trace b/l edema, multiple superficial ulcers on LLE. In ACE wrap  Skin: Intact, warm, no rashes.  LABS: PULMONARY  Recent Labs Lab 11/15/14 1729 11/17/14 0607 11/17/14 1303 11/17/14 1424 11/18/14 0347  PHART 7.231* 7.310* 7.471* 7.462* 7.407  PCO2ART 53.8* 44.5 28.2* 29.9* 36.9  PO2ART 102.0* 106.0* 123.0* 103.0* 52.5*  HCO3 22.6 21.8  20.6 21.3 22.4  TCO2 24 23.1 21 22  23.4  O2SAT 96.0 97.4 99.0 98.0 83.6    CBC  Recent Labs Lab 11/17/14 0330 11/17/14 1105 11/18/14 0530  HGB 7.7*  7.7* 7.8* 7.8*  HCT 24.7*  25.2* 25.7* 25.2*  WBC 9.4  9.2 10.5 8.6  PLT 147*  143* 148* 147*    COAGULATION  Recent Labs Lab 12/03/2014 0921 11/26/2014 1939 11/14/14 0935 11/16/14 0239  INR 7.64* 8.45* 2.08* 1.58*    CARDIAC    Recent Labs Lab 11/14/14 1100 11/14/14 2000 11/15/14 0145  TROPONINI 0.04* 0.17* 0.66*   No results for input(s): PROBNP in the last 168 hours.   CHEMISTRY  Recent Labs Lab 11/14/14 0935 11/15/14 0145  11/15/14 1730 11/16/14 0239 11/17/14 0330 11/18/14 0530  NA 144 146* 147* 148* 147* 147*  K 5.0 5.8* 4.6 4.7 4.8 4.2  CL 111 112 115* 117* 114* 115*  CO2 GLUCOSE 91 147* 162* 118* 124* 164*  BUN 105* 103* 96* 98* 113* 123*  CREATININE 3.89* 3.62* 3.22* 3.13* 3.11* 2.73*  CALCIUM 9.3 9.4 9.2 9.2 8.8 9.0  MG 2.7* 2.7*  --  2.5 2.4 2.2  PHOS 6.1* 5.6*  --  4.3 3.7 3.2   Estimated Creatinine Clearance: 22.5 mL/min (by C-G formula based on Cr of 2.73).   LIVER  Recent Labs Lab 11/24/2014 0921 11/20/2014 1939 11/28/2014 2020 11/14/14 0935 11/16/14 0239 11/18/14 0530  AST  --   --  19  --   --  32  ALT  --   --  20  --   --  23  ALKPHOS  --   --  62  --   --  50  BILITOT  --   --  0.1*  --   --  0.5  PROT  --   --  5.6*  --   --  5.7*  ALBUMIN  --   --  3.2*  --   --  2.5*  INR 7.64* 8.45*  --  2.08* 1.58*  --     INFECTIOUS  Recent Labs Lab 11/21/2014 1242 11/16/14 1030 11/17/14 0330 11/18/14 0530  LATICACIDVEN 0.8  --   --   --   PROCALCITON  --  0.18 0.19 0.46     ENDOCRINE CBG (last 3)   Recent Labs  11/18/14 0004 11/18/14 0335 11/18/14 0804  GLUCAP 143* 137* 149*    IMAGING x48h Dg Chest Port 1 View  11/18/2014   CLINICAL DATA:  Acute respiratory failure with hypoxia  EXAM: PORTABLE CHEST - 1 VIEW  COMPARISON:  11/17/2014; 11/14/2014; 11/09/2014 ; 12/15/2013; chest CT -12/16/2013  FINDINGS: Grossly unchanged cardiac silhouette and mediastinal contours given decreased lung volumes and patient rotation stable positioning of support apparatus. No pneumothorax. The pulmonary vasculature remains indistinct with cephalization of flow. Trace bilateral effusions are again suspected in appear grossly unchanged. Grossly unchanged bibasilar heterogeneous/ consolidative opacities, left greater than right. No definitive new focal airspace opacities. Unchanged bones.  IMPRESSION: 1.  Stable positioning of support apparatus.  No pneumothorax. 2. Similar  findings of pulmonary edema, small bilateral effusions and associated bibasilar opacities, left greater than right, atelectasis versus infiltrate.   Electronically Signed   By: Simonne Come M.D.   On: 11/18/2014 07:03   Dg Chest Port 1 View  11/17/2014   CLINICAL DATA:  Endotracheal tube placement.  Subsequent encounter.  EXAM: PORTABLE CHEST - 1 VIEW  COMPARISON:  Chest  radiograph performed 11/16/2014  FINDINGS: The patient's endotracheal tube is seen ending 1-2 cm above the carina. This could be retracted 1-2 cm, as deemed clinically appropriate.  The enteric tube is seen extending below the diaphragm. A left IJ line is noted ending about the mid SVC.  The lungs are hypoexpanded. Persistent bilateral airspace opacities are seen, with underlying interstitial prominence and vascular congestion. A small left pleural effusion is again noted. Airspace opacities are mildly improved from the prior study. No pneumothorax is identified.  The cardiomediastinal silhouette is mildly enlarged. No acute osseous abnormalities are seen.  IMPRESSION: 1. Endotracheal tube seen ending 1-2 cm above the carina. This could be retracted by 1-2 cm, as deemed clinically appropriate. 2. Lungs hypoexpanded. Persistent bilateral airspace opacities, with underlying interstitial prominence. Small left pleural effusion again noted. Airspace opacities are mildly improved from the prior study. 3. Vascular congestion and mild cardiomegaly noted.  These results were called by telephone at the time of interpretation on 11/17/2014 at 7:32 am to Nursing on [id number], who verbally acknowledged these results.   Electronically Signed   By: Roanna RaiderJeffery  Chang M.D.   On: 11/17/2014 07:32    ASSESSMENT / PLAN:  PULMONARY A: Acute on Chronic Hypoxic Respiratory failure: favor aspiration w/ ALI vs TRALI +/- element of edema: PCT neg w/ reassuring sputum off abx  Moderate COPD Pulmonary nodule - 7mm RLL seen on chest CT Feb 2015. No sig improvement. Actually  needing higher FIO2/Peep.  P:   Full vent support - high peep/ ARDS approach Will push lasix  DuoNebs scheduled. CXR in AM. See ID section   CARDIOVASCULAR A:  AF w/ RVR  Risk of hyperkalemia induced arrhythmias Chronic diastolic heart failure - on opd  Hx HTN, HLD OPD meds:  lasix, losartan, lopressor, nifedipine, rosuvastatin for now. >Off pressors  P:  amio gtt-->short term given concern about ALI, will adjust BB and d/c amio on 1/10 No anticoagulation  Negative Volume status goal   RENAL A:   AKI, nonoliguric-->improving  Hypernatremia  Chronic edema volume overload  P:   Lasix  No more ARB Monitor BMET intermittently Monitor I/Os Adjust free water  Saline lock   GASTROINTESTINAL A:   NO acute issues P:   SUP: IV PPI Cont tubefeeds   HEMATOLOGIC A:   Warfarin induced coagulopathy - with septra interaction prior to admit-->INR better  Acute blood loss anemia-->hgb stable  Epistaxis VTE Prophylaxis  - s/p FFP complete 12/01/2014 and 11/14/14  P:  DVT px: SCD Monitor CBC intermittently Transfuse RBCs for Hgb < 7.0 or hemorrhagic shock - not indicated presently Probably a very poor candidate for long term warfarin  INFECTIOUS A:   Chronic venous stasis ulcers LLE Pulmonary infiltrates - worrisome for aspiration PNA  P:   Blood Cx 01/05 >>> NEG Sputum Cx 01/05: few wbc>>> Follow off abx for now WOC consult requested  ENDOCRINE A:   Hyperglycemia without documented hx of DM P:   Consider SSI if glucose > 180.  NEUROLOGIC A:   Chronic pain   - on opd gabapentin and norco 11/16/14 on fent gtt  P:   RASS goal 0 to -2 Transition for precedex Avoid benzos if able  DERM/MSK A: Chronic LLE distal venous stasis ulcer 2x 2cm . Suppso[facility]  - per wound care  Family updated: Patient updated 11/14/14 and 11/15/2014 and 11/16/2014. Updated daughter Delice BisonARA on phone 11/16/2014 in detail. Guarded  prognosis explained.  She understood  Interdisciplinary Family Meeting v Palliative Care Meeting:  Due by: 11/20/14  GLOBAL VDRF following epistaxis and blood products. ALI/Volume overload.  Admit issues of warfarin - w/ Coagulopathy and AKI - resolving. ALI unchanged. PCT neg and sputum reassuring. Will adjust scheduled BB, in effort to d/c amio. Today goal as follows: a) Push lasix today b)d/c amio (have added scheduled BB) c) Cont precedex, d) will resume lasix and adjust free water replacement   Simonne Martinet ACNP-BC [facility] Pulmonary/Critical Care Pager # [phone] OR # [phone] if no answer 11/18/2014 9:05 AM   Attending Note:  I have examined patient, reviewed labs, studies and notes. I have discussed the case with Kreg Shropshire, and I agree with the data and plans as amended above. She has ARDS with worsening vent needs today. Agree with diuresis and attempt to wean. Will d/c amiodarone. Independent critical care time is 35 minutes.   Levy Pupa, MD, PhD 11/18/2014, 6:06 PM [facility] [phone] or if no answer [phone]

## 2014-11-18 NOTE — Progress Notes (Signed)
Pt's temperature is 101.5.  MD (CCM-Deterding) notified.  MD to place prn acetaminophen order.  Will continue to monitor.

## 2014-11-19 ENCOUNTER — Inpatient Hospital Stay (HOSPITAL_COMMUNITY): Payer: Medicare FFS

## 2014-11-19 LAB — CULTURE, BLOOD (SINGLE)
CULTURE: NO GROWTH
Culture: NO GROWTH

## 2014-11-19 LAB — GLUCOSE, CAPILLARY
GLUCOSE-CAPILLARY: 122 mg/dL — AB (ref 70–99)
GLUCOSE-CAPILLARY: 121 mg/dL — AB (ref 70–99)
GLUCOSE-CAPILLARY: 146 mg/dL — AB (ref 70–99)
Glucose-Capillary: 131 mg/dL — ABNORMAL HIGH (ref 70–99)
Glucose-Capillary: 210 mg/dL — ABNORMAL HIGH (ref 70–99)

## 2014-11-19 LAB — COMPREHENSIVE METABOLIC PANEL
ALT: 26 U/L (ref 0–35)
AST: 33 U/L (ref 0–37)
Albumin: 2.3 g/dL — ABNORMAL LOW (ref 3.5–5.2)
Alkaline Phosphatase: 51 U/L (ref 39–117)
Anion gap: 6 (ref 5–15)
BUN: 127 mg/dL — AB (ref 6–23)
CO2: 25 mmol/L (ref 19–32)
CREATININE: 2.5 mg/dL — AB (ref 0.50–1.10)
Calcium: 8.8 mg/dL (ref 8.4–10.5)
Chloride: 116 mEq/L — ABNORMAL HIGH (ref 96–112)
GFR calc Af Amer: 20 mL/min — ABNORMAL LOW (ref >90)
GFR, EST NON AFRICAN AMERICAN: 17 mL/min — AB (ref >90)
Glucose, Bld: 144 mg/dL — ABNORMAL HIGH (ref 70–99)
Potassium: 4 mmol/L (ref 3.5–5.1)
Sodium: 147 mmol/L — ABNORMAL HIGH (ref 135–145)
TOTAL PROTEIN: 5.8 g/dL — AB (ref 6.0–8.3)
Total Bilirubin: 0.8 mg/dL (ref 0.3–1.2)

## 2014-11-19 LAB — CBC
HCT: 26.3 % — ABNORMAL LOW (ref 36.0–46.0)
Hemoglobin: 8.1 g/dL — ABNORMAL LOW (ref 12.0–15.0)
MCH: 31 pg (ref 26.0–34.0)
MCHC: 30.8 g/dL (ref 30.0–36.0)
MCV: 100.8 fL — ABNORMAL HIGH (ref 78.0–100.0)
Platelets: 139 10*3/uL — ABNORMAL LOW (ref 150–400)
RBC: 2.61 MIL/uL — AB (ref 3.87–5.11)
RDW: 16.8 % — AB (ref 11.5–15.5)
WBC: 7.9 10*3/uL (ref 4.0–10.5)

## 2014-11-19 LAB — POCT I-STAT 3, ART BLOOD GAS (G3+)
Acid-base deficit: 3 mmol/L — ABNORMAL HIGH (ref 0.0–2.0)
BICARBONATE: 20.6 meq/L (ref 20.0–24.0)
O2 Saturation: 99 %
Patient temperature: 98.6
TCO2: 21 mmol/L (ref 0–100)
pCO2 arterial: 28.2 mmHg — ABNORMAL LOW (ref 35.0–45.0)
pH, Arterial: 7.471 — ABNORMAL HIGH (ref 7.350–7.450)
pO2, Arterial: 123 mmHg — ABNORMAL HIGH (ref 80.0–100.0)

## 2014-11-19 MED ORDER — PROPOFOL 10 MG/ML IV EMUL
5.0000 ug/kg/min | INTRAVENOUS | Status: DC
  Administered 2014-11-19: 50 ug/kg/min via INTRAVENOUS
  Administered 2014-11-19: 15 ug/kg/min via INTRAVENOUS
  Administered 2014-11-20 (×2): 20 ug/kg/min via INTRAVENOUS
  Filled 2014-11-19 (×3): qty 100

## 2014-11-19 MED ORDER — AMIODARONE LOAD VIA INFUSION
150.0000 mg | Freq: Once | INTRAVENOUS | Status: AC
  Administered 2014-11-19: 150 mg via INTRAVENOUS
  Filled 2014-11-19: qty 83.34

## 2014-11-19 MED ORDER — AMIODARONE HCL IN DEXTROSE 360-4.14 MG/200ML-% IV SOLN
30.0000 mg/h | INTRAVENOUS | Status: DC
  Administered 2014-11-19 – 2014-11-24 (×9): 30 mg/h via INTRAVENOUS
  Filled 2014-11-19 (×21): qty 200

## 2014-11-19 MED ORDER — FUROSEMIDE 10 MG/ML IJ SOLN
40.0000 mg | Freq: Four times a day (QID) | INTRAMUSCULAR | Status: AC
  Administered 2014-11-19 (×2): 40 mg via INTRAVENOUS
  Filled 2014-11-19 (×2): qty 4

## 2014-11-19 MED ORDER — PHENYLEPHRINE HCL 10 MG/ML IJ SOLN
0.0000 ug/min | INTRAVENOUS | Status: DC
  Administered 2014-11-19: 30 ug/min via INTRAVENOUS
  Administered 2014-11-19: 50 ug/min via INTRAVENOUS
  Administered 2014-11-19: 35 ug/min via INTRAVENOUS
  Administered 2014-11-21: 50 ug/min via INTRAVENOUS
  Filled 2014-11-19 (×3): qty 1

## 2014-11-19 MED ORDER — FENTANYL CITRATE 0.05 MG/ML IJ SOLN
100.0000 ug | INTRAMUSCULAR | Status: DC | PRN
  Administered 2014-11-19 – 2014-11-23 (×19): 100 ug via INTRAVENOUS
  Filled 2014-11-19 (×19): qty 2

## 2014-11-19 MED ORDER — AMIODARONE HCL IN DEXTROSE 360-4.14 MG/200ML-% IV SOLN
60.0000 mg/h | INTRAVENOUS | Status: AC
  Administered 2014-11-19 (×2): 60 mg/h via INTRAVENOUS
  Filled 2014-11-19 (×2): qty 200

## 2014-11-19 MED ORDER — FREE WATER: 250.0000 mL | Freq: Four times a day (QID) | Status: DC

## 2014-11-19 MED ORDER — PROPOFOL 10 MG/ML IV EMUL
INTRAVENOUS | Status: AC
  Administered 2014-11-19: 50 ug/kg/min via INTRAVENOUS
  Filled 2014-11-19: qty 100

## 2014-11-19 MED ORDER — FREE WATER
250.0000 mL | Status: DC
Start: 2014-11-19 — End: 2014-11-21
  Administered 2014-11-19 – 2014-11-21 (×11): 250 mL

## 2014-11-19 NOTE — Procedures (Signed)
Bronchoscopy Procedure Note Paula MartesCarolyn M Fletcher [id number] 11/15/1934  Procedure: Bronchoscopy Indications: Diagnostic evaluation of the airways and Remove secretions  Procedure Details Consent: Risks of procedure as well as the alternatives and risks of each were explained to the (patient/caregiver).  Consent for procedure obtained. Time Out: Verified patient identification, verified procedure, site/side was marked, verified correct patient position, special equipment/implants available, medications/allergies/relevent history reviewed, required imaging and test results available.  Performed  In preparation for procedure, patient was given 100% FiO2 and bronchoscope lubricated. Sedation: Propofol  Airway entered and the following bronchi were examined: RUL, RML, RLL, LUL, LLL and Bronchi.   Complete obstruction of the left main and RUL bronchi that were all cleared. Bronchoscope removed.    Evaluation Hemodynamic Status: BP stable throughout; O2 sats: stable throughout Patient's Current Condition: stable Specimens:  None Complications: No apparent complications Patient did tolerate procedure well.   YACOUB,WESAM 11/19/2014

## 2014-11-19 NOTE — Progress Notes (Signed)
UR completed. Pt now on vent. Family meeting due tomorrow according to CCM's note.   Carlyle LipaMichelle Kaymarie Wynn, RN BSN MHA CCM Trauma/Neuro ICU Case Manager [phone]

## 2014-11-19 NOTE — Progress Notes (Signed)
PULMONARY / CRITICAL CARE MEDICINE   Name: Paula Fletcher MRN: [id number] DOB: 1935/11/06    ADMISSION DATE:  12/04/2014 CONSULTATION DATE:  11/19/2014  REFERRING MD :  [facility]  CHIEF COMPLAINT:  Nose bleed, generalized weakness, SOB  INITIAL PRESENTATION:   Paula Fletcher is a 80 y.o. F with PMH of 4L O2 dependent COPD and A.fib on chronic coumadin, She presented to [facility] on 01/05 for epistaxis, generalized weakness, and SOB.  Epistaxis began roughly one day prior at 1230 and resolved spontaneously prior to re-starting again shortly thereafter.  She reports that bleeding was "like a stream", primarily out of the right nare.  Also believes that some blood went down her throat. In ED, she was found to have temp of 94 rectally, supratherapeutic INR at 7.64, anemia with Hgb 10, acute renal failure with SCr 3.90, hyperkalemia with K of 7.  In addition, she was started on BiPAP which was later changed to [facility].  Request was made to transfer to [facility] ICU for concerns that pt may deteriorate.  PCCM consulted for admission.  On arrival to [facility], pt noted to still be slowly bleeding from b/l nares as well as new bleeding from foley site.  STUDIES:  01/05 CT Head: no acute abnormality, opacification of right nasal cavity and nasopharynx, may be related to epistaxis. 01/05 - admitted to [facility] ICU 11/14/14: Still with intermittent very mild slow epistaxis. Hungry . Making urine.  ENT consult - Conservative Rx. Repeat FFP 11/15/14 - progressive hypoxemic resp faiure despite lasix ,INTUBATED +. CENTRAL LINE + 1/10: requiring higher FIO2/Peep/ renal fxn improved. Off pressors.  1/11 higher need for O2 today with increased PEEP.  Very agitated this AM with high RR in the high 30s.  SUBJECTIVE/OVERNIGHT/INTERVAL HX Agitated, not following commands, high PEEP and FiO2.  VITAL SIGNS: Temp:  [98.5 F (36.9 C)-99.1 F (37.3 C)] 99 F (37.2 C) (01/11 0800) Pulse Rate:  [28-125] 119 (01/11 0900) Resp:  [25-38] 36  (01/11 0900) BP: (105-159)/(46-108) 151/66 mmHg (01/11 0900) SpO2:  [89 %-100 %] 99 % (01/11 0900) FiO2 (%):  [80 %-90 %] 90 % (01/11 0900) Weight:  [117.6 kg (259 lb 4.2 oz)] 117.6 kg (259 lb 4.2 oz) (01/11 0400) HEMODYNAMICS: CVP:  [13 mmHg-18 mmHg] 18 mmHg VENTILATOR SETTINGS: Vent Mode:  [-] PRVC FiO2 (%):  [80 %-90 %] 90 % Set Rate:  [16 bmp] 16 bmp Vt Set:  [410 mL] 410 mL PEEP:  [12 cmH20] 12 cmH20 Plateau Pressure:  [23 cmH20-30 cmH20] 28 cmH20 INTAKE / OUTPUT: Intake/Output      01/10 0701 - 01/11 0700 01/11 0701 - 01/12 0700   I.V. (mL/kg) 388 (3.3) 41.1 (0.3)   NG/GT 1665 330   Total Intake(mL/kg) 2053 (17.5) 371.1 (3.2)   Urine (mL/kg/hr) 3190 (1.1) 60 (0.1)   Total Output 3190 60   Net -1137 +311.1         PHYSICAL EXAMINATION: General: WDWN female, in NAD. Neuro: Sedated. Does not f/c on wake-up.  HEENT: ET tube +. No active bleeding oropharynx. Cardiovascular: RRR, no M/R/G.  Lungs: scattered rhonchi, rales  Abdomen: BS x 4, soft, NT/ND.  Ext: Chronic stasis changes, trace b/l edema, multiple superficial ulcers on LLE. In ACE wrap  Skin: Intact, warm, no rashes.  LABS: PULMONARY  Recent Labs Lab 11/17/14 0607 11/17/14 1303 11/17/14 1424 11/18/14 0347 11/19/14 0630  PHART 7.310* 7.471* 7.462* 7.407 7.366  PCO2ART 44.5 28.2* 29.9* 36.9 40.0  PO2ART 106.0* 123.0* 103.0* 52.5*  49.9*  HCO3 21.8 20.6 21.3 22.4 22.3  TCO2 23.1 21 22  23.4 23.6  O2SAT 97.4 99.0 98.0 83.6 82.8   CBC  Recent Labs Lab 11/17/14 1105 11/18/14 0530 11/19/14 0545  HGB 7.8* 7.8* 8.1*  HCT 25.7* 25.2* 26.3*  WBC 10.5 8.6 7.9  PLT 148* 147* 139*   COAGULATION  Recent Labs Lab 12-01-14 0921 01-Dec-2014 1939 11/14/14 0935 11/16/14 0239  INR 7.64* 8.45* 2.08* 1.58*   CARDIAC  Recent Labs Lab 11/14/14 1100 11/14/14 2000 11/15/14 0145  TROPONINI 0.04* 0.17* 0.66*   No results for input(s): PROBNP in the last 168 hours.  CHEMISTRY  Recent Labs Lab  11/14/14 0935 11/15/14 0145 11/15/14 1730 11/16/14 0239 11/17/14 0330 11/18/14 0530 11/19/14 0545  NA 144 146* 147* 148* 147* 147* 147*  K 5.0 5.8* 4.6 4.7 4.8 4.2 4.0  CL 111 112 115* 117* 114* 115* 116*  CO2 22 24 25 27 22 24 25   GLUCOSE 91 147* 162* 118* 124* 164* 144*  BUN 105* 103* 96* 98* 113* 123* 127*  CREATININE 3.89* 3.62* 3.22* 3.13* 3.11* 2.73* 2.50*  CALCIUM 9.3 9.4 9.2 9.2 8.8 9.0 8.8  MG 2.7* 2.7*  --  2.5 2.4 2.2  --   PHOS 6.1* 5.6*  --  4.3 3.7 3.2  --    Estimated Creatinine Clearance: 24.6 mL/min (by C-G formula based on Cr of 2.5).  LIVER  Recent Labs Lab 12-01-14 0921 Dec 01, 2014 1939 01-Dec-2014 2020 11/14/14 0935 11/16/14 0239 11/18/14 0530 11/19/14 0545  AST  --   --  19  --   --  32 33  ALT  --   --  20  --   --  23 26  ALKPHOS  --   --  62  --   --  50 51  BILITOT  --   --  0.1*  --   --  0.5 0.8  PROT  --   --  5.6*  --   --  5.7* 5.8*  ALBUMIN  --   --  3.2*  --   --  2.5* 2.3*  INR 7.64* 8.45*  --  2.08* 1.58*  --   --    INFECTIOUS  Recent Labs Lab 01-Dec-2014 1242 11/16/14 1030 11/17/14 0330 11/18/14 0530  LATICACIDVEN 0.8  --   --   --   PROCALCITON  --  0.18 0.19 0.46   ENDOCRINE CBG (last 3)   Recent Labs  11/18/14 1948 11/18/14 2318 11/19/14 0325  GLUCAP 129* 152* 122*   IMAGING x48h Dg Chest Port 1 View  11/19/2014   CLINICAL DATA:  Pneumonia.  EXAM: PORTABLE CHEST - 1 VIEW  COMPARISON:  11/18/2014.  FINDINGS: Tracheostomy tube, NG tube, left IJ catheter in stable position. Cardiomegaly. Increasing bilateral pulmonary interstitial infiltrates noted. These findings are most likely to interstitial pulmonary edema from congestive heart failure. Pneumonitis cannot be excluded. Small bilateral pleural effusions are present. No pneumothorax.  IMPRESSION: 1. Lines and tubes in stable position. 2. Progressive congestive heart failure and interstitial edema. Pneumonitis cannot be excluded. Associated small pleural effusions.    Electronically Signed   By: Maisie Fus  Register   On: 11/19/2014 07:49   Dg Chest Port 1 View  11/18/2014   CLINICAL DATA:  Acute respiratory failure with hypoxia  EXAM: PORTABLE CHEST - 1 VIEW  COMPARISON:  11/17/2014; 11/14/2014; 2014/12/01 ; 12/15/2013; chest CT -12/16/2013  FINDINGS: Grossly unchanged cardiac silhouette and mediastinal contours given decreased lung volumes and patient rotation  stable positioning of support apparatus. No pneumothorax. The pulmonary vasculature remains indistinct with cephalization of flow. Trace bilateral effusions are again suspected in appear grossly unchanged. Grossly unchanged bibasilar heterogeneous/ consolidative opacities, left greater than right. No definitive new focal airspace opacities. Unchanged bones.  IMPRESSION: 1.  Stable positioning of support apparatus.  No pneumothorax. 2. Similar findings of pulmonary edema, small bilateral effusions and associated bibasilar opacities, left greater than right, atelectasis versus infiltrate.   Electronically Signed   By: Simonne ComeJohn  Watts M.D.   On: 11/18/2014 07:03   ASSESSMENT / PLAN:  PULMONARY A: Acute on Chronic Hypoxic Respiratory failure: favor aspiration w/ ALI vs TRALI +/- element of edema: PCT neg w/ reassuring sputum off abx  Moderate COPD Pulmonary nodule - 7mm RLL seen on chest CT Feb 2015. No sig improvement. Actually needing higher FIO2/Peep.  Concern for massive aspiration of blood. P:   Full vent support - change to PCV given high RR. Bronch today to evaluate for airway obstruction. Active diureses today DuoNebs scheduled. CXR in AM. See ID section   CARDIOVASCULAR A:  AF w/ RVR  Risk of hyperkalemia induced arrhythmias Chronic diastolic heart failure - on opd  Hx HTN, HLD OPD meds:  lasix, losartan, lopressor, nifedipine, rosuvastatin for now. >Off pressors  P:  Amio gtt-->Continue beta blockers, no amio (d/ced on 1/9). No anticoagulation  Negative Volume status goal   RENAL A:    AKI, nonoliguric-->improving  Hypernatremia  Chronic edema volume overload  P:   Lasix 40 mg IV q6 x3 doses.  No more ARB. Monitor BMET intermittently. Monitor I/Os. Increase free water to 250 ml q4 hours. Saline lock.  GASTROINTESTINAL A:   NO acute issues P:   SUP: IV PPI Cont tube feeds   HEMATOLOGIC A:   Warfarin induced coagulopathy - with septra interaction prior to admit-->INR better  Acute blood loss anemia-->hgb stable  Epistaxis VTE Prophylaxis  - s/p FFP complete 11/28/2014 and 11/14/14  P:  DVT px: SCD Monitor CBC intermittently Transfuse RBCs for Hgb < 7.0 or hemorrhagic shock - not indicated presently Probably a very poor candidate for long term warfarin  INFECTIOUS A:   Chronic venous stasis ulcers LLE Pulmonary infiltrates - worrisome for aspiration PNA  P:   Blood Cx 01/05 >>> NEG Sputum Cx 01/05: few wbc>>> Follow off abx for now  ENDOCRINE A:   Hyperglycemia without documented hx of DM P:   Consider SSI if glucose > 180.  NEUROLOGIC A:   Chronic pain   - on opd gabapentin and norco 11/16/14 on fent gtt  P:   RASS goal 0 to -2 Transition for precedex Avoid benzos if able  DERM/MSK A: Chronic LLE distal venous stasis ulcer 2x 2cm . Suppso[facility]  - per wound care  Family updated: no family bedside.  GLOBAL VDRF following epistaxis and blood products. ALI/Volume overload.  Admit issues of warfarin - w/ Coagulopathy and AKI - resolving. ALI unchanged. PCT neg and sputum reassuring. Adjusted scheduled BB, d/c amio. Active diureses.  Increase free water.  Change to PCV.  Diprivan added for vent synchrony at least until FiO2/PEEP are improved.  The patient is critically ill with multiple organ systems failure and requires high complexity decision making for assessment and support, frequent evaluation and titration of therapies, application of advanced monitoring technologies and  extensive interpretation of multiple databases.   Critical Care Time devoted to patient care services described in this  note is  35  Minutes. This time reflects time of care of this signee Dr Koren Bound. This critical care time does not reflect procedure time, or teaching time or supervisory time of PA/NP/Med student/Med Resident etc but could involve care discussion time.  Alyson Reedy, M.D. [facility] Pulmonary/Critical Care Medicine. Pager: [phone]. After hours pager: [phone].

## 2014-11-19 NOTE — Progress Notes (Signed)
eLink Physician-Brief Progress Note Patient Name: Paula MartesCarolyn M Fletcher DOB: 1935/07/26 MRN: [id number]   Date of Service  11/19/2014  HPI/Events of Note  Vent dyschriny, high rate  eICU Interventions  fent pcxr reviewed     Intervention Category Major Interventions: Respiratory failure - evaluation and management  Nelda BucksFEINSTEIN,DANIEL J. 11/19/2014, 6:17 AM

## 2014-11-20 ENCOUNTER — Inpatient Hospital Stay (HOSPITAL_COMMUNITY): Payer: Medicare FFS

## 2014-11-20 LAB — BASIC METABOLIC PANEL
Anion gap: 9 (ref 5–15)
BUN: 162 mg/dL — ABNORMAL HIGH (ref 6–23)
CO2: 23 mmol/L (ref 19–32)
Calcium: 8.3 mg/dL — ABNORMAL LOW (ref 8.4–10.5)
Chloride: 110 mEq/L (ref 96–112)
Creatinine, Ser: 2.96 mg/dL — ABNORMAL HIGH (ref 0.50–1.10)
GFR calc Af Amer: 17 mL/min — ABNORMAL LOW (ref >90)
GFR, EST NON AFRICAN AMERICAN: 14 mL/min — AB (ref >90)
GLUCOSE: 163 mg/dL — AB (ref 70–99)
POTASSIUM: 3.9 mmol/L (ref 3.5–5.1)
SODIUM: 142 mmol/L (ref 135–145)

## 2014-11-20 LAB — POCT I-STAT 3, ART BLOOD GAS (G3+)
Acid-base deficit: 7 mmol/L — ABNORMAL HIGH (ref 0.0–2.0)
Bicarbonate: 19 mEq/L — ABNORMAL LOW (ref 20.0–24.0)
O2 Saturation: 91 %
TCO2: 20 mmol/L (ref 0–100)
pCO2 arterial: 37.8 mmHg (ref 35.0–45.0)
pH, Arterial: 7.311 — ABNORMAL LOW (ref 7.350–7.450)
pO2, Arterial: 66 mmHg — ABNORMAL LOW (ref 80.0–100.0)

## 2014-11-20 LAB — MAGNESIUM: MAGNESIUM: 2 mg/dL (ref 1.5–2.5)

## 2014-11-20 LAB — BLOOD GAS, ARTERIAL
Acid-base deficit: 2.2 mmol/L — ABNORMAL HIGH (ref 0.0–2.0)
Acid-base deficit: 4.7 mmol/L — ABNORMAL HIGH (ref 0.0–2.0)
Bicarbonate: 22.3 mEq/L (ref 20.0–24.0)
Bicarbonate: 20.5 mEq/L (ref 20.0–24.0)
DRAWN BY: 39898
Drawn by: 39898
FIO2: 0.8 %
FIO2: 0.95 %
MECHVT: 410 mL
O2 SAT: 82.8 %
O2 Saturation: 92.4 %
PCO2 ART: 40.5 mmHg (ref 35.0–45.0)
PEEP/CPAP: 12 cmH2O
PEEP: 12 cmH2O
PO2 ART: 64.9 mmHg — AB (ref 80.0–100.0)
PRESSURE CONTROL: 18 cmH2O
Patient temperature: 99.1
Patient temperature: 98.6
RATE: 16 resp/min
RATE: 18 resp/min
TCO2: 23.6 mmol/L (ref 0–100)
TCO2: 21.7 mmol/L (ref 0–100)
pCO2 arterial: 41.5 mmHg (ref 35.0–45.0)
pH, Arterial: 7.362 (ref 7.350–7.450)
pH, Arterial: 7.314 — ABNORMAL LOW (ref 7.350–7.450)
pO2, Arterial: 50.9 mmHg — ABNORMAL LOW (ref 80.0–100.0)

## 2014-11-20 LAB — CBC
HCT: 25.5 % — ABNORMAL LOW (ref 36.0–46.0)
Hemoglobin: 8 g/dL — ABNORMAL LOW (ref 12.0–15.0)
MCH: 32.8 pg (ref 26.0–34.0)
MCHC: 31.4 g/dL (ref 30.0–36.0)
MCV: 104.5 fL — AB (ref 78.0–100.0)
Platelets: 165 10*3/uL (ref 150–400)
RBC: 2.44 MIL/uL — ABNORMAL LOW (ref 3.87–5.11)
RDW: 16.7 % — AB (ref 11.5–15.5)
WBC: 8 10*3/uL (ref 4.0–10.5)

## 2014-11-20 LAB — GLUCOSE, CAPILLARY
Glucose-Capillary: 139 mg/dL — ABNORMAL HIGH (ref 70–99)
Glucose-Capillary: 148 mg/dL — ABNORMAL HIGH (ref 70–99)
Glucose-Capillary: 153 mg/dL — ABNORMAL HIGH (ref 70–99)
Glucose-Capillary: 153 mg/dL — ABNORMAL HIGH (ref 70–99)
Glucose-Capillary: 159 mg/dL — ABNORMAL HIGH (ref 70–99)

## 2014-11-20 LAB — PHOSPHORUS: Phosphorus: 5.1 mg/dL — ABNORMAL HIGH (ref 2.3–4.6)

## 2014-11-20 MED ORDER — PRISMASOL BGK 4/2.5 32-4-2.5 MEQ/L IV SOLN
INTRAVENOUS | Status: DC
  Administered 2014-11-20 – 2014-11-24 (×3): via INTRAVENOUS_CENTRAL
  Filled 2014-11-20 (×8): qty 5000

## 2014-11-20 MED ORDER — PRISMASOL BGK 4/2.5 32-4-2.5 MEQ/L IV SOLN
INTRAVENOUS | Status: DC
  Administered 2014-11-20 – 2014-11-25 (×39): via INTRAVENOUS_CENTRAL
  Filled 2014-11-20 (×60): qty 5000

## 2014-11-20 MED ORDER — PROPOFOL 10 MG/ML IV EMUL: 5.0000 ug/kg/min | INTRAVENOUS | Status: DC

## 2014-11-20 MED ORDER — SODIUM CHLORIDE 0.9 % FOR CRRT
INTRAVENOUS_CENTRAL | Status: DC | PRN
  Filled 2014-11-20: qty 1000

## 2014-11-20 MED ORDER — VECURONIUM BROMIDE 10 MG IV SOLR: 6.0000 mg | Freq: Once | INTRAVENOUS | Status: AC

## 2014-11-20 MED ORDER — HEPARIN SODIUM (PORCINE) 1000 UNIT/ML DIALYSIS
1000.0000 [IU] | INTRAMUSCULAR | Status: DC | PRN
  Filled 2014-11-20: qty 6

## 2014-11-20 MED ORDER — VECURONIUM BROMIDE 10 MG IV SOLR
INTRAVENOUS | Status: AC
  Administered 2014-11-20: 13:00:00
  Filled 2014-11-20: qty 10

## 2014-11-20 MED ORDER — PRISMASOL BGK 4/2.5 32-4-2.5 MEQ/L IV SOLN
INTRAVENOUS | Status: DC
  Administered 2014-11-20 – 2014-11-25 (×4): via INTRAVENOUS_CENTRAL
  Filled 2014-11-20 (×10): qty 5000

## 2014-11-20 NOTE — Procedures (Signed)
Hemodialysis Insertion Procedure Note Paula MartesCarolyn M Fletcher [id number] Oct 15, 1935  Procedure: Insertion of Hemodialysis Catheter Type: 3 port  Indications: Hemodialysis   Procedure Details Consent: Risks of procedure as well as the alternatives and risks of each were explained to the (patient/caregiver).  Consent for procedure obtained. Time Out: Verified patient identification, verified procedure, site/side was marked, verified correct patient position, special equipment/implants available, medications/allergies/relevent history reviewed, required imaging and test results available.  Performed  Maximum sterile technique was used including antiseptics, cap, gloves, gown, hand hygiene, mask and sheet. Skin prep: Chlorhexidine; local anesthetic administered A antimicrobial bonded/coated triple lumen catheter was placed in the left femoral vein due to emergent situation using the Seldinger technique. Ultrasound guidance used.Yes.   Catheter placed to 20 cm. Blood aspirated via all 3 ports and then flushed x 3. Line sutured x 2 and dressing applied.  Evaluation Blood flow good Complications: No apparent complications Patient did tolerate procedure well. Chest X-ray ordered to verify placement.  CXR: not needed.  Brett CanalesSteve Minor ACNP [facility] Pager [phone] till 3 pm If no answer page [phone] 11/20/2014, 1:17 PM  U/S used in placement.  I was present and supervised the entire procedure.  Alyson ReedyWesam G. Anfernee Peschke, M.D. [facility]. Pager: [phone]. After hours pager: [phone].

## 2014-11-20 NOTE — Consult Note (Signed)
Referring Provider: No ref. provider found Primary Care Physician:  Clovis Pu, Arturo Morton, DO Primary Nephrologist:    Reason for Consultation:  Acute renal failure with pulmonary edema  HPI: 80 y.o. F with PMH of 4L O2 dependent COPD and A.fib on chronic coumadin, She presented to [facility] on 01/05 for epistaxis, generalized weakness, and SOB. Epistaxis began roughly one day prior at 1230 and resolved spontaneously prior to re-starting again shortly thereafter. She reports that bleeding was "like a stream", primarily out of the right nare. Appears morbidly obese with chronic stasis changes in the lower extremity. She appears to have declining urine output and worsening creatinine.   Past Medical History  Diagnosis Date  . COPD (chronic obstructive pulmonary disease)   . Essential hypertension, benign   . Hyperlipidemia   . Gout   . Pleural effusion, bilateral     Noted February 2015 in the setting of pneumonia and diastolic heart failure  . CAP (community acquired pneumonia)   . Chronic diastolic heart failure     LVEF 60-65%, grade 1 diastolic dysfunction  . Atrial fibrillation     Diagnosed February 2015  . Lung nodule     7 mm right lower lobe pulmonary nodule - chest CT February 2015  . Mitral stenosis     Mild to moderate    Past Surgical History  Procedure Laterality Date  . Cholecystectomy    . Appendectomy    . Abdominal hysterectomy      Prior to Admission medications   Medication Sig Start Date End Date Taking? Authorizing Provider  allopurinol (ZYLOPRIM) 300 MG tablet Take 150 mg by mouth daily.    Yes Historical Provider, MD  cephALEXin (KEFLEX) 500 MG capsule Take 500 mg by mouth 3 (three) times daily.   Yes Historical Provider, MD  Fluticasone-Salmeterol (ADVAIR) 250-50 MCG/DOSE AEPB Inhale 1 puff into the lungs 2 (two) times daily.   Yes Historical Provider, MD  furosemide (LASIX) 40 MG tablet Take 40-80 mg by mouth 3 (three) times daily. 40 mg in the morning, 80 mg at  lunch, and 40 mg in the evenings 12/17/13  Yes Elliot Cousin, MD  gabapentin (NEURONTIN) 300 MG capsule Take 300 mg by mouth 2 (two) times daily.    Yes Historical Provider, MD  HYDROcodone-acetaminophen (NORCO/VICODIN) 5-325 MG per tablet Take 0.5 tablets by mouth every 4 (four) hours as needed for moderate pain.    Yes Historical Provider, MD  losartan (COZAAR) 100 MG tablet Take 0.5 tablets (50 mg total) by mouth daily. 12/17/13  Yes Elliot Cousin, MD  metoprolol (LOPRESSOR) 50 MG tablet Take 1 tablet (50 mg total) by mouth 2 (two) times daily. Take 1 tablet in the morning and 1 tablet in the evening. 12/17/13  Yes Elliot Cousin, MD  NIFEdipine (PROCARDIA-XL/ADALAT-CC/NIFEDICAL-XL) 30 MG 24 hr tablet Take 30 mg by mouth daily.   Yes Historical Provider, MD  rosuvastatin (CRESTOR) 40 MG tablet Take 40 mg by mouth at bedtime.    Yes Historical Provider, MD  sulfamethoxazole-trimethoprim (BACTRIM DS,SEPTRA DS) 800-160 MG per tablet Take 1 tablet by mouth 2 (two) times daily.   Yes Historical Provider, MD  tiotropium (SPIRIVA) 18 MCG inhalation capsule Place 18 mcg into inhaler and inhale daily.   Yes Historical Provider, MD  warfarin (COUMADIN) 5 MG tablet Take 2.5-5 mg by mouth daily at 6 PM.   Yes Historical Provider, MD  albuterol (PROVENTIL HFA;VENTOLIN HFA) 108 (90 BASE) MCG/ACT inhaler Inhale 2 puffs into the lungs 2 (  two) times daily. Patient not taking: Reported on 2014-12-02 12/17/13   Elliot Cousin, MD  predniSONE (DELTASONE) 20 MG tablet Take 1 tablet (20 mg total) by mouth daily as needed (For gout pain). Patient not taking: Reported on 12/02/14 12/17/13   Elliot Cousin, MD  warfarin (COUMADIN) 5 MG tablet Take 1.5 tablets (7.5 mg total) by mouth daily. Take 1 and a 1/2 tablets each evening. Further dosing will be given to you by the Coumadin clinic. Patient not taking: Reported on 12/02/14 12/17/13   Elliot Cousin, MD    Current Facility-Administered Medications  Medication Dose Route Frequency  Provider Last Rate Last Dose  . acetaminophen (TYLENOL) solution 650 mg  650 mg Per Tube Q6H PRN Zigmund Gottron, MD   650 mg at 11/18/14 0415  . amiodarone (NEXTERONE PREMIX) 360 MG/200ML (1.8 mg/mL) IV infusion  30 mg/hr Intravenous Continuous Alyson Reedy, MD 16.7 mL/hr at 11/20/14 0700 30 mg/hr at 11/20/14 0700  . antiseptic oral rinse (CPC / CETYLPYRIDINIUM CHLORIDE 0.05%) solution 7 mL  7 mL Mouth Rinse QID Kalman Shan, MD   7 mL at 11/20/14 0400  . bacitracin ointment   Topical BID Flo Shanks, MD      . chlorhexidine (PERIDEX) 0.12 % solution 15 mL  15 mL Mouth Rinse BID Kalman Shan, MD   15 mL at 11/20/14 0926  . feeding supplement (PRO-STAT SUGAR FREE 64) liquid 60 mL  60 mL Per Tube QID Heather Cornelison Pitts, RD   60 mL at 11/20/14 0927  . feeding supplement (VITAL HIGH PROTEIN) liquid 1,000 mL  1,000 mL Per Tube Q24H Heather Cornelison Pitts, RD   1,000 mL at 11/19/14 1345  . fentaNYL (SUBLIMAZE) injection 100 mcg  100 mcg Intravenous Q1H PRN Nelda Bucks, MD   100 mcg at 11/19/14 [phone]  . free water 250 mL  250 mL Per Tube Q4H Alyson Reedy, MD   250 mL at 11/20/14 0700  . ipratropium-albuterol (DUONEB) 0.5-2.5 (3) MG/3ML nebulizer solution 3 mL  3 mL Nebulization Q6H Rahul P Desai, PA-C   3 mL at 11/20/14 0754  . metoprolol (LOPRESSOR) injection 5 mg  5 mg Intravenous Q3H Alyson Reedy, MD   5 mg at 11/19/14 0900  . midazolam (VERSED) injection 1-4 mg  1-4 mg Intravenous Q2H PRN Kalman Shan, MD   2 mg at 11/20/14 1210  . pantoprazole (PROTONIX) injection 40 mg  40 mg Intravenous Q24H Merwyn Katos, MD   40 mg at 11/19/14 0900  . phenylephrine (NEO-SYNEPHRINE) 10 mg in dextrose 5 % 250 mL (0.04 mg/mL) infusion  0-400 mcg/min Intravenous Titrated Alyson Reedy, MD 15 mL/hr at 11/20/14 0746 10 mcg/min at 11/20/14 0746  . sodium chloride (OCEAN) 0.65 % nasal spray 2 spray  2 spray Each Nare PRN Flo Shanks, MD        Allergies as of 2014/12/02  .  (No Known Allergies)    Family History  Problem Relation Age of Onset  . Heart failure Mother     History   Social History  . Marital Status: Married    Spouse Name: N/A    Number of Children: N/A  . Years of Education: N/A   Occupational History  . Not on file.   Social History Main Topics  . Smoking status: Former Smoker    Types: Cigarettes  . Smokeless tobacco: Former Neurosurgeon  . Alcohol Use: No  . Drug Use: No  . Sexual Activity: No  Other Topics Concern  . Not on file   Social History Narrative    Review of Systems: unobtainable   Physical Exam: Vital signs in last 24 hours: Temp:  [98.3 F (36.8 C)-98.9 F (37.2 C)] 98.5 F (36.9 C) (01/12 0428) Pulse Rate:  [69-131] 100 (01/12 0755) Resp:  [22-35] 27 (01/12 0755) BP: (87-143)/(34-94) 113/55 mmHg (01/12 0700) SpO2:  [92 %-100 %] 95 % (01/12 0755) FiO2 (%):  [95 %] 95 % (01/12 0755) Weight:  [120.8 kg (266 lb 5.1 oz)] 120.8 kg (266 lb 5.1 oz) (01/12 0428) Last BM Date: 11/17/14 General:   Weak ill appearing Head:  Normocephalic and atraumatic. Eyes:  Sclera clear, no icterus.   Conjunctiva pink. Ears:  Normal auditory acuity. Nose:  No deformity, discharge,  or lesions. Mouth:  ET tube Neck:  Supple; no masses or thyromegaly. JVP not elevated Lungs:  Clear throughout to auscultation.   No wheezes, crackles, or rhonchi. No acute distress. Heart:  Regular rate and rhythm; no murmurs, clicks, rubs,  or gallops. Abdomen:  Soft, nontender and nondistended. No masses, hepatosplenomegaly or hernias noted. Normal bowel sounds, without guarding, and without rebound.   Msk:  Symmetrical without gross deformities. Normal posture. Pulses:  No carotid, renal, femoral bruits. DP and PT symmetrical and equal Extremities:  Chronic stasis changes, trace b/l edema, multiple superficial ulcers on LLE. In ACE wrap      Intake/Output from previous day: 01/11 0701 - 01/12 0700 In: 3270.1 [I.V.:1475.1;  NG/GT:1795] Out: 845 [Urine:845] Intake/Output this shift:    Lab Results:  Recent Labs  11/18/14 0530 11/19/14 0545 11/20/14 0540  WBC 8.6 7.9 8.0  HGB 7.8* 8.1* 8.0*  HCT 25.2* 26.3* 25.5*  PLT 147* 139* 165   BMET  Recent Labs  11/18/14 0530 11/19/14 0545 11/20/14 0540  NA 147* 147* 142  K 4.2 4.0 3.9  CL 115* 116* 110  CO2 24 25 23   GLUCOSE 164* 144* 163*  BUN 123* 127* 162*  CREATININE 2.73* 2.50* 2.96*  CALCIUM 9.0 8.8 8.3*  PHOS 3.2  --  5.1*   LFT  Recent Labs  11/19/14 0545  PROT 5.8*  ALBUMIN 2.3*  AST 33  ALT 26  ALKPHOS 51  BILITOT 0.8   PT/INR No results for input(s): LABPROT, INR in the last 72 hours. Hepatitis Panel No results for input(s): HEPBSAG, HCVAB, HEPAIGM, HEPBIGM in the last 72 hours.  Studies/Results: Dg Chest Port 1 View  11/20/2014   CLINICAL DATA:  Intubation.  EXAM: PORTABLE CHEST - 1 VIEW  COMPARISON:  11/19/2014.  FINDINGS: Endotracheal tube, NG tube, left IJ line in stable position. Stable cardiomegaly. Persistent mild pulmonary venous congestion. Interim slight improvement of bilateral pulmonary interstitial edema. Small pleural effusions again noted. No pneumothorax. No acute osseus abnormality.  IMPRESSION: 1. Lines and tubes in stable position. 2. Interim slight clearing of bilateral pulmonary edema. Persistent enters edema remains. Superimposed pneumonitis cannot be excluded .   Electronically Signed   By: Maisie Fus  Register   On: 11/20/2014 07:44   Dg Chest Port 1 View  11/19/2014   CLINICAL DATA:  Pneumonia.  EXAM: PORTABLE CHEST - 1 VIEW  COMPARISON:  11/18/2014.  FINDINGS: Tracheostomy tube, NG tube, left IJ catheter in stable position. Cardiomegaly. Increasing bilateral pulmonary interstitial infiltrates noted. These findings are most likely to interstitial pulmonary edema from congestive heart failure. Pneumonitis cannot be excluded. Small bilateral pleural effusions are present. No pneumothorax.  IMPRESSION: 1. Lines  and tubes in stable position.  2. Progressive congestive heart failure and interstitial edema. Pneumonitis cannot be excluded. Associated small pleural effusions.   Electronically Signed   By: Maisie Fushomas  Register   On: 11/19/2014 07:49    Assessment/Plan:  Acute renal failure    Baseline chronic renal failure with a baseline creatinine of 1.8 . The BUN of 160 would be likely to be due to blood ingested from nosebleed and a volume contracted state.  Hypotension most likely hypovolemia in the setting of bleeding   Anemia  Epistaxis  Patient could receive blood products  Also had a coagulopathy from warfarin exposure  Certainly we could attempt CRRT. This may help reduce the azotemia and could assist in helping the bleeding diathesis   LOS: 7 Neftali Thurow W @TODAY @12 :26 PM

## 2014-11-20 NOTE — Progress Notes (Signed)
Spoke with husband and daughters, after discussion, agreed to proceed with HD but if patient is to deteriorate with events of cardiac arrest that no CPR and no cardioversion will be offered.  Will change code status.  Alyson ReedyWesam G. Jaivyn Gulla, M.D. [facility]. Pager: [phone]. After hours pager: [phone].

## 2014-11-20 NOTE — Progress Notes (Signed)
PULMONARY / CRITICAL CARE MEDICINE   Name: Paula MartesCarolyn M Abdelaziz MRN: [id number] DOB: 1935/01/01    ADMISSION DATE:  11/16/2014 CONSULTATION DATE:  11/20/2014  REFERRING MD :  [facility]  CHIEF COMPLAINT:  Nose bleed, generalized weakness, SOB  INITIAL PRESENTATION:   Paula Fletcher is a 80 y.o. F with PMH of 4L O2 dependent COPD and A.fib on chronic coumadin, She presented to [facility] on 01/05 for epistaxis, generalized weakness, and SOB.  Epistaxis began roughly one day prior at 1230 and resolved spontaneously prior to re-starting again shortly thereafter.  She reports that bleeding was "like a stream", primarily out of the right nare.  Also believes that some blood went down her throat. In ED, she was found to have temp of 94 rectally, supratherapeutic INR at 7.64, anemia with Hgb 10, acute renal failure with SCr 3.90, hyperkalemia with K of 7.  In addition, she was started on BiPAP which was later [facility].  [facility] for concerns that pt may deteriorate.  PCCM consulted for admission.  [facility] pt noted to still be slowly bleeding from b/l nares as well as new bleeding from foley site.  STUDIES:  01/05 CT Head: no acute abnormality, opacification of right nasal cavity and nasopharynx, may be related to epistaxis. 01/05 [facility]: Still with intermittent very mild slow epistaxis. Hungry . Making urine.  ENT consult - Conservative Rx. Repeat FFP 11/15/14 - progressive hypoxemic resp faiure despite lasix ,INTUBATED +. CENTRAL LINE + 1/10: requiring higher FIO2/Peep/ renal fxn improved. Off pressors.  1/11 higher need for O2 today with increased PEEP.  Very agitated this AM with high RR in the high 30s. 1/12 serum Cr deteriorating and UOP dropping with drop in BP.  SUBJECTIVE/OVERNIGHT/INTERVAL HX Agitated, not following commands, high PEEP and FiO2.  VITAL SIGNS: Temp:  [98.3 F (36.8 C)-99.8 F (37.7 C)] 98.5 F (36.9 C) (01/12  0428) Pulse Rate:  [69-131] 100 (01/12 0755) Resp:  [19-37] 27 (01/12 0755) BP: (67-151)/(24-94) 113/55 mmHg (01/12 0700) SpO2:  [92 %-100 %] 95 % (01/12 0755) FiO2 (%):  [90 %-95 %] 95 % (01/12 0755) Weight:  [120.8 kg (266 lb 5.1 oz)] 120.8 kg (266 lb 5.1 oz) (01/12 0428)   HEMODYNAMICS: CVP:  [13 mmHg-15 mmHg] 15 mmHg  VENTILATOR SETTINGS: Vent Mode:  [-] PCV FiO2 (%):  [90 %-95 %] 95 % Set Rate:  [18 bmp] 18 bmp PEEP:  [12 cmH20] 12 cmH20 Plateau Pressure:  [24 cmH20-25 cmH20] 25 cmH20  INTAKE / OUTPUT: Intake/Output      01/11 0701 - 01/12 0700 01/12 0701 - 01/13 0700   I.V. (mL/kg) 1475.1 (12.2)    NG/GT 1795    Total Intake(mL/kg) 3270.1 (27.1)    Urine (mL/kg/hr) 845 (0.3)    Total Output 845     Net +2425.1           PHYSICAL EXAMINATION: General: WDWN female, in NAD. Neuro: Sedated. Does not f/c on wake-up.  HEENT: ET tube +. No active bleeding oropharynx. Cardiovascular: RRR, no M/R/G.  Lungs: scattered rhonchi, rales  Abdomen: BS x 4, soft, NT/ND.  Ext: Chronic stasis changes, trace b/l edema, multiple superficial ulcers on LLE. In ACE wrap  Skin: Intact, warm, no rashes.  LABS: PULMONARY  Recent Labs Lab 11/17/14 1303 11/17/14 1424 11/18/14 0347 11/19/14 0630 11/20/14 0337  PHART 7.471* 7.462* 7.407 7.362 7.314*  PCO2ART 28.2* 29.9* 36.9 40.5  41.5  PO2ART 123.0* 103.0* 52.5* 50.9* 64.9*  HCO3 20.6 21.3 22.4 22.3 20.5  TCO2 21 22 23.4 23.6 21.7  O2SAT 99.0 98.0 83.6 82.8 92.4   CBC  Recent Labs Lab 11/18/14 0530 11/19/14 0545 11/20/14 0540  HGB 7.8* 8.1* 8.0*  HCT 25.2* 26.3* 25.5*  WBC 8.6 7.9 8.0  PLT 147* 139* 165   COAGULATION  Recent Labs Lab 11/23/2014 0921 11/22/2014 1939 11/14/14 0935 11/16/14 0239  INR 7.64* 8.45* 2.08* 1.58*   CARDIAC  Recent Labs Lab 11/14/14 1100 11/14/14 2000 11/15/14 0145  TROPONINI 0.04* 0.17* 0.66*   No results for input(s): PROBNP in the last 168 hours.  CHEMISTRY  Recent Labs Lab  11/15/14 0145  11/16/14 0239 11/17/14 0330 11/18/14 0530 11/19/14 0545 11/20/14 0540  NA 146*  < > 148* 147* 147* 147* 142  K 5.8*  < > 4.7 4.8 4.2 4.0 3.9  CL 112  < > 117* 114* 115* 116* 110  CO2 24  < > 27 22 24 25 23   GLUCOSE 147*  < > 118* 124* 164* 144* 163*  BUN 103*  < > 98* 113* 123* 127* 162*  CREATININE 3.62*  < > 3.13* 3.11* 2.73* 2.50* 2.96*  CALCIUM 9.4  < > 9.2 8.8 9.0 8.8 8.3*  MG 2.7*  --  2.5 2.4 2.2  --  2.0  PHOS 5.6*  --  4.3 3.7 3.2  --  5.1*  < > = values in this interval not displayed. Estimated Creatinine Clearance: 21.1 mL/min (by C-G formula based on Cr of 2.96).     LIVER  Recent Labs Lab 11/17/2014 0921 11/19/2014 1939 12/07/2014 2020 11/14/14 0935 11/16/14 0239 11/18/14 0530 11/19/14 0545  AST  --   --  19  --   --  32 33  ALT  --   --  20  --   --  23 26  ALKPHOS  --   --  62  --   --  50 51  BILITOT  --   --  0.1*  --   --  0.5 0.8  PROT  --   --  5.6*  --   --  5.7* 5.8*  ALBUMIN  --   --  3.2*  --   --  2.5* 2.3*  INR 7.64* 8.45*  --  2.08* 1.58*  --   --    INFECTIOUS  Recent Labs Lab 11/10/2014 1242 11/16/14 1030 11/17/14 0330 11/18/14 0530  LATICACIDVEN 0.8  --   --   --   PROCALCITON  --  0.18 0.19 0.46   ENDOCRINE CBG (last 3)   Recent Labs  11/19/14 2351 11/20/14 0405 11/20/14 0807  GLUCAP 153* 148* 159*   IMAGING x48h Dg Chest Port 1 View  11/20/2014   CLINICAL DATA:  Intubation.  EXAM: PORTABLE CHEST - 1 VIEW  COMPARISON:  11/19/2014.  FINDINGS: Endotracheal tube, NG tube, left IJ line in stable position. Stable cardiomegaly. Persistent mild pulmonary venous congestion. Interim slight improvement of bilateral pulmonary interstitial edema. Small pleural effusions again noted. No pneumothorax. No acute osseus abnormality.  IMPRESSION: 1. Lines and tubes in stable position. 2. Interim slight clearing of bilateral pulmonary edema. Persistent enters edema remains. Superimposed pneumonitis cannot be excluded .   Electronically  Signed   By: Maisie Fus  Register   On: 11/20/2014 07:44   Dg Chest Port 1 View  11/19/2014   CLINICAL DATA:  Pneumonia.  EXAM: PORTABLE CHEST - 1 VIEW  COMPARISON:  11/18/2014.  FINDINGS: Tracheostomy tube, NG tube, left IJ catheter in stable position. Cardiomegaly. Increasing bilateral pulmonary interstitial infiltrates noted. These findings are most likely to interstitial pulmonary edema from congestive heart failure. Pneumonitis cannot be excluded. Small bilateral pleural effusions are present. No pneumothorax.  IMPRESSION: 1. Lines and tubes in stable position. 2. Progressive congestive heart failure and interstitial edema. Pneumonitis cannot be excluded. Associated small pleural effusions.   Electronically Signed   By: Maisie Fus  Register   On: 11/19/2014 07:49   ASSESSMENT / PLAN:  PULMONARY A: Acute on Chronic Hypoxic Respiratory failure: favor aspiration w/ ALI vs TRALI +/- element of edema: PCT neg w/ reassuring sputum off abx  Moderate COPD Pulmonary nodule - 7mm RLL seen on chest CT Feb 2015. No sig improvement. Actually needing higher FIO2/Peep.  Concern for massive aspiration of blood. Without adequate sedation due to high PEEP patient is highly agitated. P:   Full vent support - continue PCV but increase RR to 24 and PEEP to 14 with F/U ABG. Will consider oscillator today pending FiO2 later on today. Needs volume negative. Renal service to evaluate patient for CRRT. DuoNebs scheduled. CXR in AM. See ID section   CARDIOVASCULAR A:  AF w/ RVR  Risk of hyperkalemia induced arrhythmias Chronic diastolic heart failure - on opd  Hx HTN, HLD OPD meds:  lasix, losartan, lopressor, nifedipine, rosuvastatin for now. >propofol induced hypotension. P:  Amio gtt seems to be the most effective way of rate control in this patient, highly doubt amiodarone lung is remotely a factor here, will continue. No anticoagulation  Negative Volume status goal. Change propofol to versed pushes and  fentanyl drip.  RENAL A:   AKI, nonoliguric-->improving  Hypernatremia  Chronic edema volume overload  P:   Renal consult called.  No more ARB. Monitor BMET intermittently. Monitor I/Os. Continue free water to 250 ml q4 hours. Saline lock. ?CRRT, will have renal see first.  GASTROINTESTINAL A:   NO acute issues P:   SUP: IV PPI Cont tube feeds   HEMATOLOGIC A:   Warfarin induced coagulopathy - with septra interaction prior to admit-->INR better  Acute blood loss anemia-->hgb stable  Epistaxis VTE Prophylaxis  - s/p FFP complete 11/12/2014 and 11/14/14  P:  DVT px: SCD Monitor CBC intermittently Transfuse RBCs for Hgb < 7.0 or hemorrhagic shock - not indicated presently Probably a very poor candidate for long term warfarin  INFECTIOUS A:   Chronic venous stasis ulcers LLE Pulmonary infiltrates - worrisome for aspiration PNA  P:   Blood Cx 01/05 >>> NEG Sputum Cx 01/05: few wbc>>> Follow off abx for now  ENDOCRINE A:   Hyperglycemia without documented hx of DM P:   Consider SSI if glucose > 180.  NEUROLOGIC A:   Chronic pain   - on opd gabapentin and norco 11/16/14 on fent gtt Precedex was completely ineffective in this patient even with high doses. P:   RASS goal 0 to -2 Precedex completely ineffective. Versed pushes added (last resort here as will not be able to continue on propofol indefinitely). D/C propofol.  DERM/MSK A: Chronic LLE distal venous stasis ulcer 2x 2cm . Suppsoed tohave opd surgery 11/16/2014 at [facility]  - per wound care  Family updated: spoke with daughter over the phone extensively.  GLOBAL Will continue PCV, if PaO2 improve with higher PEEP then will continue, if not then will place on the oscillator.  Will get propofol off and use versed pushes instead with  the hope of getting off propofol then off neo and preserve some sort of renal function.  Renal consult called, needs volume off.  Will come back later and  place HD catheter.  The patient is critically ill with multiple organ systems failure and requires high complexity decision making for assessment and support, frequent evaluation and titration of therapies, application of advanced monitoring technologies and extensive interpretation of multiple databases.   Critical Care Time devoted to patient care services described in this note is  35  Minutes. This time reflects time of care of this signee Dr Koren Bound. This critical care time does not reflect procedure time, or teaching time or supervisory time of PA/NP/Med student/Med Resident etc but could involve care discussion time.  Alyson Reedy, M.D. [facility] Pulmonary/Critical Care Medicine. Pager: [phone]. After hours pager: [phone].

## 2014-11-21 ENCOUNTER — Inpatient Hospital Stay (HOSPITAL_COMMUNITY): Payer: Medicare FFS

## 2014-11-21 DIAGNOSIS — J189 Pneumonia, unspecified organism: Secondary | ICD-10-CM

## 2014-11-21 LAB — MAGNESIUM: MAGNESIUM: 2.2 mg/dL (ref 1.5–2.5)

## 2014-11-21 LAB — BASIC METABOLIC PANEL
ANION GAP: 11 (ref 5–15)
BUN: 99 mg/dL — ABNORMAL HIGH (ref 6–23)
CO2: 24 mmol/L (ref 19–32)
Calcium: 8.2 mg/dL — ABNORMAL LOW (ref 8.4–10.5)
Chloride: 100 mEq/L (ref 96–112)
Creatinine, Ser: 2.01 mg/dL — ABNORMAL HIGH (ref 0.50–1.10)
GFR calc Af Amer: 26 mL/min — ABNORMAL LOW (ref >90)
GFR calc non Af Amer: 23 mL/min — ABNORMAL LOW (ref >90)
Glucose, Bld: 153 mg/dL — ABNORMAL HIGH (ref 70–99)
POTASSIUM: 3.6 mmol/L (ref 3.5–5.1)
SODIUM: 135 mmol/L (ref 135–145)

## 2014-11-21 LAB — BLOOD GAS, ARTERIAL
ACID-BASE DEFICIT: 2.4 mmol/L — AB (ref 0.0–2.0)
Bicarbonate: 21.9 mEq/L (ref 20.0–24.0)
DRAWN BY: 36277
FIO2: 1 %
O2 SAT: 90.1 %
PEEP: 12 cmH2O
PO2 ART: 61 mmHg — AB (ref 80.0–100.0)
Patient temperature: 98.6
Pressure control: 18 cmH2O
RATE: 28 resp/min
TCO2: 23.1 mmol/L (ref 0–100)
pCO2 arterial: 37.9 mmHg (ref 35.0–45.0)
pH, Arterial: 7.379 (ref 7.350–7.450)

## 2014-11-21 LAB — CBC
HEMATOCRIT: 26.2 % — AB (ref 36.0–46.0)
Hemoglobin: 8.3 g/dL — ABNORMAL LOW (ref 12.0–15.0)
MCH: 31.4 pg (ref 26.0–34.0)
MCHC: 31.7 g/dL (ref 30.0–36.0)
MCV: 99.2 fL (ref 78.0–100.0)
Platelets: 183 10*3/uL (ref 150–400)
RBC: 2.64 MIL/uL — ABNORMAL LOW (ref 3.87–5.11)
RDW: 16.8 % — ABNORMAL HIGH (ref 11.5–15.5)
WBC: 11.4 10*3/uL — ABNORMAL HIGH (ref 4.0–10.5)

## 2014-11-21 LAB — PHOSPHORUS: PHOSPHORUS: 3.5 mg/dL (ref 2.3–4.6)

## 2014-11-21 LAB — GLUCOSE, CAPILLARY
GLUCOSE-CAPILLARY: 103 mg/dL — AB (ref 70–99)
GLUCOSE-CAPILLARY: 173 mg/dL — AB (ref 70–99)
Glucose-Capillary: 160 mg/dL — ABNORMAL HIGH (ref 70–99)
Glucose-Capillary: 145 mg/dL — ABNORMAL HIGH (ref 70–99)
Glucose-Capillary: 151 mg/dL — ABNORMAL HIGH (ref 70–99)
Glucose-Capillary: 141 mg/dL — ABNORMAL HIGH (ref 70–99)
Glucose-Capillary: 127 mg/dL — ABNORMAL HIGH (ref 70–99)

## 2014-11-21 LAB — TRIGLYCERIDES: TRIGLYCERIDES: 186 mg/dL — AB (ref <150)

## 2014-11-21 MED ORDER — PHENYLEPHRINE HCL 10 MG/ML IJ SOLN
0.0000 ug/min | INTRAVENOUS | Status: DC
  Administered 2014-11-21: 100 ug/min via INTRAVENOUS
  Administered 2014-11-21: 75 ug/min via INTRAVENOUS
  Administered 2014-11-22 (×2): 85 ug/min via INTRAVENOUS
  Administered 2014-11-22: 90 ug/min via INTRAVENOUS
  Administered 2014-11-23: 55 ug/min via INTRAVENOUS
  Administered 2014-11-23: 75 ug/min via INTRAVENOUS
  Administered 2014-11-24: 60 ug/min via INTRAVENOUS
  Administered 2014-11-24: 250 ug/min via INTRAVENOUS
  Administered 2014-11-25: 150 ug/min via INTRAVENOUS
  Administered 2014-11-25: 100 ug/min via INTRAVENOUS
  Filled 2014-11-21 (×11): qty 4

## 2014-11-21 NOTE — Procedures (Signed)
Arterial Catheter Insertion Procedure Note Paula MartesCarolyn M Fletcher [id number] 1935/09/02  Procedure: Insertion of Arterial Catheter  Indications: Blood pressure monitoring and Frequent blood sampling  Procedure Details Consent: Unable to obtain consent because of emergent medical necessity. Time Out: Verified patient identification, verified procedure, site/side was marked, verified correct patient position, special equipment/implants available, medications/allergies/relevent history reviewed, required imaging and test results available.  Performed  Maximum sterile technique was used including antiseptics, cap, gloves, gown, hand hygiene, mask and sheet. Skin prep: Chlorhexidine; local anesthetic administered 20 gauge catheter was inserted into right radial artery using the Seldinger technique.  Evaluation Blood flow good; BP tracing good. Complications: No apparent complications.   Paula Fletcher,Paula Fletcher 11/21/2014

## 2014-11-21 NOTE — Progress Notes (Signed)
PULMONARY / CRITICAL CARE MEDICINE   Name: Paula Fletcher MRN: [id number] DOB: 1935-07-23    ADMISSION DATE:  11/27/2014 CONSULTATION DATE:  11/21/2014  REFERRING MD :  [facility]  CHIEF COMPLAINT:  Nose bleed, generalized weakness, SOB  INITIAL PRESENTATION:   Paula Fletcher is a 80 y.o. F with PMH of 4L O2 dependent COPD and A.fib on chronic coumadin, She presented to [facility] on 01/05 for epistaxis, generalized weakness, and SOB.  Epistaxis began roughly one day prior at 1230 and resolved spontaneously prior to re-starting again shortly thereafter.  She reports that bleeding was "like a stream", primarily out of the right nare.  Also believes that some blood went down her throat. In ED, she was found to have temp of 94 rectally, supratherapeutic INR at 7.64, anemia with Hgb 10, acute renal failure with SCr 3.90, hyperkalemia with K of 7.  In addition, she was started on BiPAP which was later changed to [facility].  Request was made to transfer to [facility] ICU for concerns that pt may deteriorate.  PCCM consulted for admission.  On arrival to [facility], pt noted to still be slowly bleeding from b/l nares as well as new bleeding from foley site.  STUDIES:  01/05 CT Head: no acute abnormality, opacification of right nasal cavity and nasopharynx, may be related to epistaxis. 01/05 - admitted to [facility] ICU 11/14/14: Still with intermittent very mild slow epistaxis. Hungry . Making urine.  ENT consult - Conservative Rx. Repeat FFP 11/15/14 - progressive hypoxemic resp faiure despite lasix ,INTUBATED +. CENTRAL LINE + 1/10: requiring higher FIO2/Peep/ renal fxn improved. Off pressors.  1/11 higher need for O2 today with increased PEEP.  Very agitated this AM with high RR in the high 30s. 1/12 serum Cr deteriorating and UOP dropping with drop in BP.  SUBJECTIVE/OVERNIGHT/INTERVAL HX Agitated, not following commands, high PEEP and FiO2.  VITAL SIGNS: Temp:  [94 F (34.4 C)-99.4 F (37.4 C)] 95.9 F (35.5 C) (01/13  0800) Pulse Rate:  [60-122] 118 (01/13 0830) Resp:  [17-33] 26 (01/13 0830) BP: (69-151)/(38-91) 132/43 mmHg (01/13 0830) SpO2:  [91 %-100 %] 98 % (01/13 0830) FiO2 (%):  [100 %] 100 % (01/13 0800) Weight:  [121 kg (266 lb 12.1 oz)] 121 kg (266 lb 12.1 oz) (01/13 0500)   HEMODYNAMICS: CVP:  [11 mmHg-25 mmHg] 14 mmHg  VENTILATOR SETTINGS: Vent Mode:  [-] PCV FiO2 (%):  [100 %] 100 % Set Rate:  [18 bmp-28 bmp] 28 bmp Vt Set:  [410 mL] 410 mL PEEP:  [12 cmH20] 12 cmH20 Plateau Pressure:  [25 cmH20-40 cmH20] 40 cmH20  INTAKE / OUTPUT: Intake/Output      01/12 0701 - 01/13 0700 01/13 0701 - 01/14 0700   I.V. (mL/kg) 514 (4.2) 46.7 (0.4)   NG/GT 910 15   Total Intake(mL/kg) 1424 (11.8) 61.7 (0.5)   Urine (mL/kg/hr) 265 (0.1) 20 (0.1)   Other 605 (0.2) -33 (-0.2)   Stool 0 (0) 0 (0)   Total Output 870 -13   Net +554 +74.7        Stool Occurrence 4 x 1 x    PHYSICAL EXAMINATION: General: WDWN female, in NAD. Neuro: Sedated. Does not f/c on wake-up.  HEENT: ET tube +. No active bleeding oropharynx. Cardiovascular: RRR, no M/R/G.  Lungs: scattered rhonchi, rales  Abdomen: BS x 4, soft, NT/ND.  Ext: Chronic stasis changes, trace b/l edema, multiple superficial ulcers on LLE. In ACE wrap  Skin: Intact, warm, no rashes.  LABS: PULMONARY  Recent Labs Lab 11/18/14 0347 11/19/14 0630 11/20/14 0337 11/20/14 1103 11/21/14 0337  PHART 7.407 7.362 7.314* 7.311* 7.379  PCO2ART 36.9 40.5 41.5 37.8 37.9  PO2ART 52.5* 50.9* 64.9* 66.0* 61.0*  HCO3 22.4 22.3 20.5 19.0* 21.9  TCO2 23.4 23.6 21.7 20 23.1  O2SAT 83.6 82.8 92.4 91.0 90.1   CBC  Recent Labs Lab 11/19/14 0545 11/20/14 0540 11/21/14 0428  HGB 8.1* 8.0* 8.3*  HCT 26.3* 25.5* 26.2*  WBC 7.9 8.0 11.4*  PLT 139* 165 183   COAGULATION  Recent Labs Lab 11/14/14 0935 11/16/14 0239  INR 2.08* 1.58*   CARDIAC  Recent Labs Lab 11/14/14 1100 11/14/14 2000 11/15/14 0145  TROPONINI 0.04* 0.17* 0.66*   No  results for input(s): PROBNP in the last 168 hours.  CHEMISTRY  Recent Labs Lab 11/16/14 0239 11/17/14 0330 11/18/14 0530 11/19/14 0545 11/20/14 0540 11/21/14 0428  NA 148* 147* 147* 147* 142 135  K 4.7 4.8 4.2 4.0 3.9 3.6  CL 117* 114* 115* 116* 110 100  CO2 27 22 24 25 23 24   GLUCOSE 118* 124* 164* 144* 163* 153*  BUN 98* 113* 123* 127* 162* 99*  CREATININE 3.13* 3.11* 2.73* 2.50* 2.96* 2.01*  CALCIUM 9.2 8.8 9.0 8.8 8.3* 8.2*  MG 2.5 2.4 2.2  --  2.0 2.2  PHOS 4.3 3.7 3.2  --  5.1* 3.5   Estimated Creatinine Clearance: 31.1 mL/min (by C-G formula based on Cr of 2.01).     LIVER  Recent Labs Lab 11/14/14 0935 11/16/14 0239 11/18/14 0530 11/19/14 0545  AST  --   --  32 33  ALT  --   --  23 26  ALKPHOS  --   --  50 51  BILITOT  --   --  0.5 0.8  PROT  --   --  5.7* 5.8*  ALBUMIN  --   --  2.5* 2.3*  INR 2.08* 1.58*  --   --    INFECTIOUS  Recent Labs Lab 11/16/14 1030 11/17/14 0330 11/18/14 0530  PROCALCITON 0.18 0.19 0.46   ENDOCRINE CBG (last 3)   Recent Labs  11/21/14 0002 11/21/14 0403 11/21/14 0749  GLUCAP 173* 160* 145*   IMAGING x48h Dg Chest Port 1 View  11/21/2014   CLINICAL DATA:  Evaluate endotracheal tube  EXAM: PORTABLE CHEST - 1 VIEW  COMPARISON:  Portable chest x-ray of 11/20/2014  FINDINGS: The tip of the endotracheal tube is approximately 6.6 cm above the carina. Aeration has improved somewhat. There is persistent pulmonary vascular congestion and probable possible small effusions. Cardiomegaly is stable. Left central venous line tip overlies the mid upper SVC. Sclerotic lesions again are noted in the left humeral head.  IMPRESSION: 1. Tip of endotracheal tube 6.6 cm above the carina. 2. Improved aeration but persistent pulmonary vascular congestion.   Electronically Signed   By: Dwyane Dee M.D.   On: 11/21/2014 07:56   Dg Chest Port 1 View  11/20/2014   CLINICAL DATA:  Intubation.  EXAM: PORTABLE CHEST - 1 VIEW  COMPARISON:   11/19/2014.  FINDINGS: Endotracheal tube, NG tube, left IJ line in stable position. Stable cardiomegaly. Persistent mild pulmonary venous congestion. Interim slight improvement of bilateral pulmonary interstitial edema. Small pleural effusions again noted. No pneumothorax. No acute osseus abnormality.  IMPRESSION: 1. Lines and tubes in stable position. 2. Interim slight clearing of bilateral pulmonary edema. Persistent enters edema remains. Superimposed pneumonitis cannot be excluded .   Electronically Signed  ByMaisie Fus  Register   On: 11/20/2014 07:44   ASSESSMENT / PLAN:  PULMONARY A: Acute on Chronic Hypoxic Respiratory failure: favor aspiration w/ ALI vs TRALI +/- element of edema: PCT neg w/ reassuring sputum off abx  Moderate COPD Pulmonary nodule - 7mm RLL seen on chest CT Feb 2015. No sig improvement. Actually needing higher FIO2/Peep.  Concern for massive aspiration of blood. Without adequate sedation due to high PEEP patient is highly agitated. P:   Full vent support - continue PCV but decrease RR to 20 and increase PEEP to 15 with F/U ABG. Insert a-line today. Needs volume negative. DuoNebs scheduled. CXR in AM. See ID section   CARDIOVASCULAR A:  AF w/ RVR  Risk of hyperkalemia induced arrhythmias Chronic diastolic heart failure - on opd  Hx HTN, HLD OPD meds:  lasix, losartan, lopressor, nifedipine, rosuvastatin for now. >propofol induced hypotension. P:  Amio gtt seems to be the most effective way of rate control in this patient, highly doubt amiodarone lung is remotely a factor here, will continue. No anticoagulation. Neo for BP support. Insert a-line. Negative Volume status goal. Changed propofol to versed pushes and fentanyl drip due to hypotension.  RENAL A:   AKI, nonoliguric-->improving  Hypernatremia  Chronic edema Volume overload  P:   Renal consult appreciated, running even for now but needs volume negative for oxygenation purposes as now at FiO2  of 100% and PEEP 15 cmH2O.  No more ARB. Monitor BMET intermittently. Monitor I/Os. D/C free water. Saline lock.  GASTROINTESTINAL A:   NO acute issues P:   SUP: IV PPI Cont tube feeds   HEMATOLOGIC A:   Warfarin induced coagulopathy - with septra interaction prior to admit-->INR better  Acute blood loss anemia-->hgb stable  Epistaxis VTE Prophylaxis  - s/p FFP complete 12/02/2014 and 11/14/14  P:  DVT px: SCD Monitor CBC intermittently Transfuse RBCs for Hgb < 7.0 or hemorrhagic shock - not indicated presently A very poor candidate for long term warfarin  INFECTIOUS A:   Chronic venous stasis ulcers LLE Pulmonary infiltrates - worrisome for aspiration PNA  P:   Blood Cx 01/05 >>> NEG Sputum Cx 01/05: few wbc>>> Follow off abx for now.  ENDOCRINE A:   Hyperglycemia without documented hx of DM P:   Consider SSI if glucose > 180.  NEUROLOGIC A:   Chronic pain   - on opd gabapentin and norco 11/16/14 on fent gtt Precedex was completely ineffective in this patient even with high doses. P:   RASS goal 0 to -2 Precedex completely ineffective and will not be restarted again. Versed pushes added (last resort here as will not be able to continue on propofol indefinitely). D/C propofol.  DERM/MSK A: Chronic LLE distal venous stasis ulcer 2x 2cm . Suppsoed tohave opd surgery 11/16/2014 at [facility]  - per wound care  Family updated: condition discussed with family yesterday, made LCB with no CPR/cardioversion.  They are currently discussing trach/peg.  GLOBAL Vent adjusted as above.  Patient still requiring 100% and 15 of PEEP.  Desperately needs volume negative.  Family discussing trach/peg option.  Continue to suction blood clots and will follow up with that.  Continue abx.  Wean steroids as above.  The patient is critically ill with multiple organ systems failure and requires high complexity decision making for assessment and support, frequent  evaluation and titration of therapies, application of advanced monitoring technologies and extensive interpretation of multiple databases.   Critical Care Time  devoted to patient care services described in this note is  35  Minutes. This time reflects time of care of this signee Dr Koren BoundWesam Hason Ofarrell. This critical care time does not reflect procedure time, or teaching time or supervisory time of PA/NP/Med student/Med Resident etc but could involve care discussion time.  Alyson ReedyWesam G. Mayte Diers, M.D. [facility] Pulmonary/Critical Care Medicine. Pager: [phone]. After hours pager: [phone].

## 2014-11-21 NOTE — Progress Notes (Signed)
[facility] ROUNDING NOTE   Subjective:   Interval History: appears more alert  Objective:  Vital signs in last 24 hours:  Temp:  [94 F (34.4 C)-99.4 F (37.4 C)] 98 F (36.7 C) (01/13 1200) Pulse Rate:  [60-122] 103 (01/13 1300) Resp:  [16-33] 20 (01/13 1300) BP: (69-151)/(35-91) 110/35 mmHg (01/13 1300) SpO2:  [93 %-100 %] 100 % (01/13 1300) Arterial Line BP: (88-140)/(41-66) 100/42 mmHg (01/13 1300) FiO2 (%):  [10 %-100 %] 10 % (01/13 1200) Weight:  [121 kg (266 lb 12.1 oz)] 121 kg (266 lb 12.1 oz) (01/13 0500)  Weight change: 0.2 kg (7.1 oz) Filed Weights   11/19/14 0400 11/20/14 0428 11/21/14 0500  Weight: 117.6 kg (259 lb 4.2 oz) 120.8 kg (266 lb 5.1 oz) 121 kg (266 lb 12.1 oz)    Intake/Output: I/O last 3 completed shifts: In: 3326.5 [I.V.:1366.5; NG/GT:1960] Out: 1255 [Urine:650; Other:605]   Intake/Output this shift:  Total I/O In: 825.1 [I.V.:534.1; NG/GT:291] Out: 333 [Urine:61; Other:270; Stool:2]  General: obese lady Neuro: Sedated.  HEENT: ET tube +.  Cardiovascular: RRR, Lungs: scattered rhonchi, rales  Abdomen: hypoactive non distended Ext: LLE. In ACE wrap  Skin: Intact, bruising    Basic Metabolic Panel:  Recent Labs Lab 11/16/14 0239 11/17/14 0330 11/18/14 0530 11/19/14 0545 11/20/14 0540 11/21/14 0428  NA 148* 147* 147* 147* 142 135  K 4.7 4.8 4.2 4.0 3.9 3.6  CL 117* 114* 115* 116* 110 100  CO2 GLUCOSE 118* 124* 164* 144* 163* 153*  BUN 98* 113* 123* 127* 162* 99*  CREATININE 3.13* 3.11* 2.73* 2.50* 2.96* 2.01*  CALCIUM 9.2 8.8 9.0 8.8 8.3* 8.2*  MG 2.5 2.4 2.2  --  2.0 2.2  PHOS 4.3 3.7 3.2  --  5.1* 3.5    Liver Function Tests:  Recent Labs Lab 11/18/14 0530 11/19/14 0545  AST 32 33  ALT 23 26  ALKPHOS 50 51  BILITOT 0.5 0.8  PROT 5.7* 5.8*  ALBUMIN 2.5* 2.3*   No results for input(s): LIPASE, AMYLASE in the last 168 hours. No results for input(s): AMMONIA in the last 168  hours.  CBC:  Recent Labs Lab 11/15/14 0145  11/16/14 0159 11/16/14 0239  11/17/14 0330 11/17/14 1105 11/18/14 0530 11/19/14 0545 11/20/14 0540 11/21/14 0428  WBC 9.4  < > 7.3 7.6  < > 9.4  9.2 10.5 8.6 7.9 8.0 11.4*  NEUTROABS 8.0*  --  5.7 6.0  --  7.5  --  7.3  --   --   --   HGB 8.4*  < > 7.1* 7.2*  < > 7.7*  7.7* 7.8* 7.8* 8.1* 8.0* 8.3*  HCT 25.9*  < > 22.8* 22.8*  < > 24.7*  25.2* 25.7* 25.2* 26.3* 25.5* 26.2*  MCV 100.4*  < > 101.3* 103.6*  < > 101.2*  101.2* 101.6* 100.0 100.8* 104.5* 99.2  PLT 159  < > 139* 135*  < > 147*  143* 148* 147* 139* 165 183  < > = values in this interval not displayed.  Cardiac Enzymes:  Recent Labs Lab 11/14/14 2000 11/15/14 0145  TROPONINI 0.17* 0.66*    BNP: Invalid input(s): POCBNP  CBG:  Recent Labs Lab 11/20/14 1952 11/21/14 0002 11/21/14 0403 11/21/14 0749 11/21/14 1214  GLUCAP 153* 173* 160* 145* 151*    Microbiology: Results for orders placed or performed during the hospital encounter of 11/12/2014  Culture, blood (single)     Status: None  Collection Time: 12-08-2014 11:13 AM  Result Value Ref Range Status   Specimen Description BLOOD RIGHT ANTECUBITAL  Final   Special Requests BOTTLES DRAWN AEROBIC AND ANAEROBIC 6CC  Final   Culture NO GROWTH 6 DAYS  Final   Report Status 11/19/2014 FINAL  Final  Culture, blood (single)     Status: None   Collection Time: December 08, 2014  1:10 PM  Result Value Ref Range Status   Specimen Description BLOOD RIGHT ANTECUBITAL  Final   Special Requests BOTTLES DRAWN AEROBIC ONLY 4CC  IMMUNE:COMPROMISED  Final   Culture NO GROWTH 6 DAYS  Final   Report Status 11/19/2014 FINAL  Final  MRSA PCR Screening     Status: None   Collection Time: 11/15/14  6:26 AM  Result Value Ref Range Status   MRSA by PCR NEGATIVE NEGATIVE Final    Comment:        The GeneXpert MRSA Assay (FDA approved for NASAL specimens only), is one component of a comprehensive MRSA colonization surveillance  program. It is not intended to diagnose MRSA infection nor to guide or monitor treatment for MRSA infections.   Culture, respiratory (NON-Expectorated)     Status: None   Collection Time: 11/16/14  3:30 PM  Result Value Ref Range Status   Specimen Description TRACHEAL ASPIRATE  Final   Special Requests Normal  Final   Gram Stain   Final    FEW WBC PRESENT,BOTH PMN AND MONONUCLEAR RARE SQUAMOUS EPITHELIAL CELLS PRESENT RARE YEAST Performed at [facility]    Culture   Final    FEW YEAST CONSISTENT WITH CANDIDA SPECIES Performed at [facility]    Report Status 11/18/2014 FINAL  Final    Coagulation Studies: No results for input(s): LABPROT, INR in the last 72 hours.  Urinalysis: No results for input(s): COLORURINE, LABSPEC, PHURINE, GLUCOSEU, HGBUR, BILIRUBINUR, KETONESUR, PROTEINUR, UROBILINOGEN, NITRITE, LEUKOCYTESUR in the last 72 hours.  Invalid input(s): APPERANCEUR    Imaging: Dg Chest Port 1 View  11/21/2014   CLINICAL DATA:  Evaluate endotracheal tube  EXAM: PORTABLE CHEST - 1 VIEW  COMPARISON:  Portable chest x-ray of 11/20/2014  FINDINGS: The tip of the endotracheal tube is approximately 6.6 cm above the carina. Aeration has improved somewhat. There is persistent pulmonary vascular congestion and probable possible small effusions. Cardiomegaly is stable. Left central venous line tip overlies the mid upper SVC. Sclerotic lesions again are noted in the left humeral head.  IMPRESSION: 1. Tip of endotracheal tube 6.6 cm above the carina. 2. Improved aeration but persistent pulmonary vascular congestion.   Electronically Signed   By: Dwyane Dee M.D.   On: 11/21/2014 07:56   Dg Chest Port 1 View  11/20/2014   CLINICAL DATA:  Intubation.  EXAM: PORTABLE CHEST - 1 VIEW  COMPARISON:  11/19/2014.  FINDINGS: Endotracheal tube, NG tube, left IJ line in stable position. Stable cardiomegaly. Persistent mild pulmonary venous congestion. Interim slight improvement of  bilateral pulmonary interstitial edema. Small pleural effusions again noted. No pneumothorax. No acute osseus abnormality.  IMPRESSION: 1. Lines and tubes in stable position. 2. Interim slight clearing of bilateral pulmonary edema. Persistent enters edema remains. Superimposed pneumonitis cannot be excluded .   Electronically Signed   By: Maisie Fus  Register   On: 11/20/2014 07:44     Medications:   . amiodarone 30 mg/hr (11/21/14 0800)  . phenylephrine (NEO-SYNEPHRINE) Adult infusion 100 mcg/min (11/21/14 1250)  . dialysis replacement fluid (prismasate) 200 mL/hr at 11/21/14 1233  . dialysis  replacement fluid (prismasate) 300 mL/hr at 11/20/14 1808  . dialysate (PRISMASATE) 2,000 mL/hr at 11/21/14 1207   . antiseptic oral rinse  7 mL Mouth Rinse QID  . bacitracin   Topical BID  . chlorhexidine  15 mL Mouth Rinse BID  . feeding supplement (PRO-STAT SUGAR FREE 64)  60 mL Per Tube QID  . feeding supplement (VITAL HIGH PROTEIN)  1,000 mL Per Tube Q24H  . ipratropium-albuterol  3 mL Nebulization Q6H  . metoprolol  5 mg Intravenous Q3H  . pantoprazole (PROTONIX) IV  40 mg Intravenous Q24H   acetaminophen (TYLENOL) oral liquid 160 mg/5 mL, fentaNYL, heparin, midazolam, sodium chloride, sodium chloride  Assessment/ Plan:   Acute renal failure Baseline chronic renal failure with a baseline creatinine of 1.8 . The BUN of 160 would be likely to be due to blood ingested from nosebleed and a volume contracted state.  Hypotension most likely hypovolemia in the setting of bleeding   Anemia Epistaxis Patient could receive blood products Also had a coagulopathy from warfarin exposure  Patient stable on CRRT  Critical care have recommended that attempts are made to remove fluid my concern is that hypotension may ensue . Recommend cautious fluid removal     LOS: 8 Zissy Hamlett W @TODAY @1 :21 PM

## 2014-11-21 NOTE — Progress Notes (Signed)
ET tude had moved to 20 at the lip. Repositioned to 24 at the lip

## 2014-11-21 NOTE — Progress Notes (Signed)
Talked with Dr. Molli KnockYacoub who wants to remove more fluid from patient. Talked with Dr. Hyman HopesWebb and told to remove 25-50 cc/hr according to how the patient is tolerating it. Will continue to monitor. Paula Fletcher. Paula Fletcher, Paula MaesNikki Fletcher

## 2014-11-22 ENCOUNTER — Inpatient Hospital Stay (HOSPITAL_COMMUNITY): Payer: Medicare FFS

## 2014-11-22 LAB — BASIC METABOLIC PANEL
ANION GAP: 12 (ref 5–15)
BUN: 60 mg/dL — ABNORMAL HIGH (ref 6–23)
CHLORIDE: 102 meq/L (ref 96–112)
CO2: 23 mmol/L (ref 19–32)
CREATININE: 1.45 mg/dL — AB (ref 0.50–1.10)
Calcium: 8.3 mg/dL — ABNORMAL LOW (ref 8.4–10.5)
GFR calc Af Amer: 39 mL/min — ABNORMAL LOW (ref >90)
GFR calc non Af Amer: 34 mL/min — ABNORMAL LOW (ref >90)
Glucose, Bld: 134 mg/dL — ABNORMAL HIGH (ref 70–99)
Potassium: 3.9 mmol/L (ref 3.5–5.1)
Sodium: 137 mmol/L (ref 135–145)

## 2014-11-22 LAB — MAGNESIUM: Magnesium: 2.2 mg/dL (ref 1.5–2.5)

## 2014-11-22 LAB — BLOOD GAS, ARTERIAL
Acid-Base Excess: 0.2 mmol/L (ref 0.0–2.0)
Bicarbonate: 24.2 mEq/L — ABNORMAL HIGH (ref 20.0–24.0)
DRAWN BY: 252031
FIO2: 1 %
O2 SAT: 97.4 %
PCO2 ART: 38.6 mmHg (ref 35.0–45.0)
PEEP: 15 cmH2O
Patient temperature: 99
Pressure control: 20 cmH2O
RATE: 20 resp/min
TCO2: 25.3 mmol/L (ref 0–100)
pH, Arterial: 7.415 (ref 7.350–7.450)
pO2, Arterial: 100 mmHg (ref 80.0–100.0)

## 2014-11-22 LAB — GLUCOSE, CAPILLARY
GLUCOSE-CAPILLARY: 62 mg/dL — AB (ref 70–99)
GLUCOSE-CAPILLARY: 193 mg/dL — AB (ref 70–99)
Glucose-Capillary: 96 mg/dL (ref 70–99)
Glucose-Capillary: 18 mg/dL — CL (ref 70–99)
Glucose-Capillary: 45 mg/dL — ABNORMAL LOW (ref 70–99)

## 2014-11-22 LAB — CBC
HEMATOCRIT: 25.8 % — AB (ref 36.0–46.0)
HEMOGLOBIN: 8.1 g/dL — AB (ref 12.0–15.0)
MCH: 31.9 pg (ref 26.0–34.0)
MCHC: 31.4 g/dL (ref 30.0–36.0)
MCV: 101.6 fL — AB (ref 78.0–100.0)
Platelets: 211 10*3/uL (ref 150–400)
RBC: 2.54 MIL/uL — AB (ref 3.87–5.11)
RDW: 17.1 % — AB (ref 11.5–15.5)
WBC: 13.3 10*3/uL — AB (ref 4.0–10.5)

## 2014-11-22 LAB — PHOSPHORUS: Phosphorus: 2.4 mg/dL (ref 2.3–4.6)

## 2014-11-22 MED ORDER — VITAL HIGH PROTEIN PO LIQD
1000.0000 mL | ORAL | Status: DC
  Administered 2014-11-22 – 2014-11-24 (×3): 1000 mL
  Filled 2014-11-22 (×5): qty 1000

## 2014-11-22 MED ORDER — DEXTROSE 50 % IV SOLN
INTRAVENOUS | Status: AC
Start: 2014-11-22 — End: 2014-11-22
  Administered 2014-11-22: 25 mL
  Filled 2014-11-22: qty 50

## 2014-11-22 MED ORDER — PRO-STAT SUGAR FREE PO LIQD
60.0000 mL | Freq: Three times a day (TID) | ORAL | Status: DC
  Administered 2014-11-22 – 2014-11-24 (×7): 60 mL
  Filled 2014-11-22 (×10): qty 60

## 2014-11-22 NOTE — Progress Notes (Signed)
UR completed.  Zali Kamaka, RN BSN [facility] CCM Trauma/Neuro ICU Case Manager [phone]  

## 2014-11-22 NOTE — Progress Notes (Signed)
PULMONARY / CRITICAL CARE MEDICINE   Name: Paula Fletcher MRN: [id number] DOB: 11-10-1934    ADMISSION DATE:  11/24/2014 CONSULTATION DATE:  11/22/2014  REFERRING MD :  [facility]  CHIEF COMPLAINT:  Nose bleed, generalized weakness, SOB  INITIAL PRESENTATION:   Paula Fletcher is a 80 y.o. F with PMH of 4L O2 dependent COPD and A.fib on chronic coumadin, She presented to [facility] on 01/05 for epistaxis, generalized weakness, and SOB.  Epistaxis began roughly one day prior at 1230 and resolved spontaneously prior to re-starting again shortly thereafter.  She reports that bleeding was "like a stream", primarily out of the right nare.  Also believes that some blood went down her throat. In ED, she was found to have temp of 94 rectally, supratherapeutic INR at 7.64, anemia with Hgb 10, acute renal failure with SCr 3.90, hyperkalemia with K of 7.  In addition, she was started on BiPAP which was later changed to [facility].  Request was made to transfer to [facility] ICU for concerns that pt may deteriorate.  PCCM consulted for admission.  On arrival to [facility], pt noted to still be slowly bleeding from b/l nares as well as new bleeding from foley site.  STUDIES:  01/05 CT Head: no acute abnormality, opacification of right nasal cavity and nasopharynx, may be related to epistaxis. 01/05 - admitted to [facility] ICU 11/14/14: Still with intermittent very mild slow epistaxis. Hungry . Making urine.  ENT consult - Conservative Rx. Repeat FFP 11/15/14 - progressive hypoxemic resp faiure despite lasix ,INTUBATED +. CENTRAL LINE + 1/10: requiring higher FIO2/Peep/ renal fxn improved. Off pressors.  1/11 higher need for O2 today with increased PEEP.  Very agitated this AM with high RR in the high 30s. 1/12 serum Cr deteriorating and UOP dropping with drop in BP.  SUBJECTIVE/OVERNIGHT/INTERVAL HX Tolerating volume negative well, much more alert and interactive.  VITAL SIGNS: Temp:  [97.7 F (36.5 C)-98.5 F (36.9 C)] 98.5 F (36.9  C) (01/14 0400) Pulse Rate:  [49-123] 118 (01/14 0800) Resp:  [13-32] 27 (01/14 0900) BP: (96-154)/(35-111) 131/47 mmHg (01/14 0900) SpO2:  [98 %-100 %] 100 % (01/14 0900) Arterial Line BP: (82-172)/(35-153) 145/44 mmHg (01/14 0900) FiO2 (%):  [10 %-100 %] 90 % (01/14 0750) Weight:  [119.4 kg (263 lb 3.7 oz)] 119.4 kg (263 lb 3.7 oz) (01/14 0500)   HEMODYNAMICS: CVP:  [14 mmHg-34 mmHg] 18 mmHg  VENTILATOR SETTINGS: Vent Mode:  [-] PCV FiO2 (%):  [10 %-100 %] 90 % Set Rate:  [20 bmp] 20 bmp PEEP:  [15 cmH20] 15 cmH20 Plateau Pressure:  [28 cmH20-38 cmH20] 38 cmH20  INTAKE / OUTPUT: Intake/Output      01/13 0701 - 01/14 0700 01/14 0701 - 01/15 0700   I.V. (mL/kg) 1322.2 (11.1) 131 (1.1)   Other 56    NG/GT 756 30   Total Intake(mL/kg) 2134.2 (17.9) 161 (1.3)   Urine (mL/kg/hr) 140 (0) 3 (0)   Other 2725 (1) 241 (0.7)   Stool 2 (0)    Blood 150 (0.1)    Total Output 3017 244   Net -882.8 -83        Stool Occurrence 3 x     PHYSICAL EXAMINATION: General: Much more alert and interactive this AM. Neuro: Awake. Moving all ext to command. HEENT: ET tube +. No active bleeding oropharynx. Cardiovascular: RRR, no M/R/G.  Lungs: scattered rhonchi, rales  Abdomen: BS x 4, soft, NT/ND.  Ext: Chronic stasis changes, trace b/l edema, multiple  superficial ulcers on LLE. In ACE wrap  Skin: Intact, warm, no rashes.  LABS: PULMONARY  Recent Labs Lab 11/19/14 0630 11/20/14 0337 11/20/14 1103 11/21/14 0337 11/22/14 0337  PHART 7.362 7.314* 7.311* 7.379 7.415  PCO2ART 40.5 41.5 37.8 37.9 38.6  PO2ART 50.9* 64.9* 66.0* 61.0* 100.0  HCO3 22.3 20.5 19.0* 21.9 24.2*  TCO2 23.6 21.7 20 23.1 25.3  O2SAT 82.8 92.4 91.0 90.1 97.4   CBC  Recent Labs Lab 11/20/14 0540 11/21/14 0428 11/22/14 0540  HGB 8.0* 8.3* 8.1*  HCT 25.5* 26.2* 25.8*  WBC 8.0 11.4* 13.3*  PLT 165 183 211   COAGULATION  Recent Labs Lab 11/16/14 0239  INR 1.58*   CARDIAC No results for input(s):  TROPONINI in the last 168 hours. No results for input(s): PROBNP in the last 168 hours.  CHEMISTRY  Recent Labs Lab 11/17/14 0330 11/18/14 0530 11/19/14 0545 11/20/14 0540 11/21/14 0428 11/22/14 0540  NA 147* 147* 147* 142 135 137  K 4.8 4.2 4.0 3.9 3.6 3.9  CL 114* 115* 116* 110 100 102  CO2 22 24 25 23 24 23   GLUCOSE 124* 164* 144* 163* 153* 134*  BUN 113* 123* 127* 162* 99* 60*  CREATININE 3.11* 2.73* 2.50* 2.96* 2.01* 1.45*  CALCIUM 8.8 9.0 8.8 8.3* 8.2* 8.3*  MG 2.4 2.2  --  2.0 2.2 2.2  PHOS 3.7 3.2  --  5.1* 3.5 2.4    Estimated Creatinine Clearance: 42.7 mL/min (by C-G formula based on Cr of 1.45).     LIVER  Recent Labs Lab 11/16/14 0239 11/18/14 0530 11/19/14 0545  AST  --  32 33  ALT  --  23 26  ALKPHOS  --  50 51  BILITOT  --  0.5 0.8  PROT  --  5.7* 5.8*  ALBUMIN  --  2.5* 2.3*  INR 1.58*  --   --    INFECTIOUS  Recent Labs Lab 11/16/14 1030 11/17/14 0330 11/18/14 0530  PROCALCITON 0.18 0.19 0.46   ENDOCRINE CBG (last 3)   Recent Labs  11/22/14 0824 11/22/14 0853 11/22/14 0901  GLUCAP 62* 45* 193*   IMAGING x48h Dg Chest Port 1 View  11/22/2014   CLINICAL DATA:  Evaluate endotracheal tube position  EXAM: PORTABLE CHEST - 1 VIEW  COMPARISON:  Portable chest x-ray 11/21/2014  FINDINGS: The tip of the endotracheal tube is approximately 4.6 cm above the carina. There is little change in pulmonary vascular congestion, basilar atelectasis, possible small effusions. Left IJ central venous line remains. Heart size is stable.  IMPRESSION: Little change in pulmonary vascular congestion and possible small effusions with basilar atelectasis. Endotracheal tube tip is 4.6 cm above the carina.   Electronically Signed   By: Dwyane DeePaul  Barry M.D.   On: 11/22/2014 08:05   Dg Chest Port 1 View  11/21/2014   CLINICAL DATA:  Evaluate endotracheal tube  EXAM: PORTABLE CHEST - 1 VIEW  COMPARISON:  Portable chest x-ray of 11/20/2014  FINDINGS: The tip of the  endotracheal tube is approximately 6.6 cm above the carina. Aeration has improved somewhat. There is persistent pulmonary vascular congestion and probable possible small effusions. Cardiomegaly is stable. Left central venous line tip overlies the mid upper SVC. Sclerotic lesions again are noted in the left humeral head.  IMPRESSION: 1. Tip of endotracheal tube 6.6 cm above the carina. 2. Improved aeration but persistent pulmonary vascular congestion.   Electronically Signed   By: Dwyane DeePaul  Barry M.D.   On: 11/21/2014 07:56  ASSESSMENT / PLAN:  PULMONARY A: Acute on Chronic Hypoxic Respiratory failure: favor aspiration w/ ALI vs TRALI +/- element of edema: PCT neg w/ reassuring sputum off abx  Moderate COPD Pulmonary nodule - 7mm RLL seen on chest CT Feb 2015. No sig improvement. Actually needing higher FIO2/Peep.  Concern for massive aspiration of blood. Without adequate sedation due to high PEEP patient is highly agitated. P:   Full vent support - continue PCV, goal today is to decrease FiO2 down to 60% and keep PEEP at 15. Increase volume negative as below. DuoNebs scheduled. CXR in AM. See ID section.  CARDIOVASCULAR A:  AF w/ RVR  Risk of hyperkalemia induced arrhythmias Chronic diastolic heart failure - on opd  Hx HTN, HLD OPD meds:  lasix, losartan, lopressor, nifedipine, rosuvastatin for now. >propofol induced hypotension. P:  Amio gtt seems to be the most effective way of rate control in this patient, highly doubt amiodarone lung is remotely a factor here, will continue. No anticoagulation. Neo for BP support. Negative Volume status. Changed propofol to versed pushes and fentanyl drip due to hypotension.  RENAL A:   AKI, nonoliguric-->improving  Hypernatremia  Chronic edema Volume overload  P:   Renal consult appreciated.  No more ARB. Monitor BMET intermittently. Replace electrolytes as neded. Increase CVVH out to 100 ml/hr and can adjust neo as needed. Monitor  I/Os. D/Ced free water. Saline lock.  GASTROINTESTINAL A:   NO acute issues P:   SUP: IV PPI Cont tube feeds   HEMATOLOGIC A:   Warfarin induced coagulopathy - with septra interaction prior to admit-->INR better  Acute blood loss anemia-->hgb stable  Epistaxis VTE Prophylaxis  - s/p FFP complete 11-23-14 and 11/14/14  P:  DVT px: SCD Monitor CBC intermittently Transfuse RBCs for Hgb < 7.0 or hemorrhagic shock - not indicated presently A very poor candidate for long term warfarin  INFECTIOUS A:   Chronic venous stasis ulcers LLE Pulmonary infiltrates - worrisome for aspiration PNA  P:   Blood Cx 01/05 >>> NEG Sputum Cx 01/05: few wbc>>> Follow off abx for now.  ENDOCRINE A:   Hyperglycemia without documented hx of DM P:   Consider SSI if glucose > 180.  NEUROLOGIC A:   Chronic pain   - on opd gabapentin and norco 11/16/14 on fent gtt Precedex was completely ineffective in this patient even with high doses. P:   RASS goal 0 to -2 Precedex completely ineffective and will not be restarted again. Minimize sedation to pushes given sensitivity to narcotics.  DERM/MSK A: Chronic LLE distal venous stasis ulcer 2x 2cm . Suppsoed tohave opd surgery 11/16/2014 at [facility] Wound Care P  - per wound care  Family updated:  No family bedside 1/14.  GLOBAL FiO2 dropping slowly, increase volume negative to 100 ml/hr.  Hopefully will be able to decrease FiO2.  Once able to decrease FiO2 well then will need to discuss trach/peg options.  The patient is critically ill with multiple organ systems failure and requires high complexity decision making for assessment and support, frequent evaluation and titration of therapies, application of advanced monitoring technologies and extensive interpretation of multiple databases.   Critical Care Time devoted to patient care services described in this note is  35  Minutes. This time reflects time of care of this signee Dr Koren Bound. This critical care time does not reflect procedure time, or teaching time or supervisory time of PA/NP/Med student/Med Resident etc but could involve care discussion time.  Rush Farmer, M.D. [facility] Pulmonary/Critical Care Medicine. Pager: [phone]. After hours pager: [phone].

## 2014-11-22 NOTE — Progress Notes (Signed)
[facility] ROUNDING NOTE   Subjective:   Interval History: unchanged is managing fluid removal with CRRT   Objective:  Vital signs in last 24 hours:  Temp:  [97.7 F (36.5 C)-98.5 F (36.9 C)] 98.5 F (36.9 C) (01/14 0400) Pulse Rate:  [49-123] 94 (01/14 1000) Resp:  [13-32] 23 (01/14 1000) BP: (96-154)/(35-111) 108/56 mmHg (01/14 1000) SpO2:  [98 %-100 %] 100 % (01/14 1000) Arterial Line BP: (82-172)/(35-153) 124/41 mmHg (01/14 1000) FiO2 (%):  [10 %-100 %] 90 % (01/14 0750) Weight:  [119.4 kg (263 lb 3.7 oz)] 119.4 kg (263 lb 3.7 oz) (01/14 0500)  Weight change: -1.6 kg (-3 lb 8.4 oz) Filed Weights   11/20/14 0428 11/21/14 0500 11/22/14 0500  Weight: 120.8 kg (266 lb 5.1 oz) 121 kg (266 lb 12.1 oz) 119.4 kg (263 lb 3.7 oz)    Intake/Output: I/O last 3 completed shifts: In: 3184.6 [I.V.:1522.6; Other:56; NG/GT:1606] Out: 3887 [Urine:405; Other:3330; Stool:2; Blood:150]   Intake/Output this shift:  Total I/O In: 161 [I.V.:131; NG/GT:30] Out: 359 [Urine:3; Other:356]  CVS- RRR RS- CTA ET   rhonchi ABD- BS hypoactive EXT- no edema   Basic Metabolic Panel:  Recent Labs Lab 11/17/14 0330 11/18/14 0530 11/19/14 0545 11/20/14 0540 11/21/14 0428 11/22/14 0540  NA 147* 147* 147* 142 135 137  K 4.8 4.2 4.0 3.9 3.6 3.9  CL 114* 115* 116* 110 100 102  CO2 22 24 25 23 24 23   GLUCOSE 124* 164* 144* 163* 153* 134*  BUN 113* 123* 127* 162* 99* 60*  CREATININE 3.11* 2.73* 2.50* 2.96* 2.01* 1.45*  CALCIUM 8.8 9.0 8.8 8.3* 8.2* 8.3*  MG 2.4 2.2  --  2.0 2.2 2.2  PHOS 3.7 3.2  --  5.1* 3.5 2.4    Liver Function Tests:  Recent Labs Lab 11/18/14 0530 11/19/14 0545  AST 32 33  ALT 23 26  ALKPHOS 50 51  BILITOT 0.5 0.8  PROT 5.7* 5.8*  ALBUMIN 2.5* 2.3*   No results for input(s): LIPASE, AMYLASE in the last 168 hours. No results for input(s): AMMONIA in the last 168 hours.  CBC:  Recent Labs Lab 11/16/14 0159 11/16/14 0239  11/17/14 0330   11/18/14 0530 11/19/14 0545 11/20/14 0540 11/21/14 0428 11/22/14 0540  WBC 7.3 7.6  < > 9.4  9.2  < > 8.6 7.9 8.0 11.4* 13.3*  NEUTROABS 5.7 6.0  --  7.5  --  7.3  --   --   --   --   HGB 7.1* 7.2*  < > 7.7*  7.7*  < > 7.8* 8.1* 8.0* 8.3* 8.1*  HCT 22.8* 22.8*  < > 24.7*  25.2*  < > 25.2* 26.3* 25.5* 26.2* 25.8*  MCV 101.3* 103.6*  < > 101.2*  101.2*  < > 100.0 100.8* 104.5* 99.2 101.6*  PLT 139* 135*  < > 147*  143*  < > 147* 139* 165 183 211  < > = values in this interval not displayed.  Cardiac Enzymes: No results for input(s): CKTOTAL, CKMB, CKMBINDEX, TROPONINI in the last 168 hours.  BNP: Invalid input(s): POCBNP  CBG:  Recent Labs Lab 11/21/14 2314 11/22/14 0810 11/22/14 0824 11/22/14 0853 11/22/14 0901  GLUCAP 103* 18* 62* 45* 193*    Microbiology: Results for orders placed or performed during the hospital encounter of 12/14/2014  Culture, blood (single)     Status: None   Collection Time: 12/14/2014 11:13 AM  Result Value Ref Range Status   Specimen Description BLOOD  RIGHT ANTECUBITAL  Final   Special Requests BOTTLES DRAWN AEROBIC AND ANAEROBIC 6CC  Final   Culture NO GROWTH 6 DAYS  Final   Report Status 11/19/2014 FINAL  Final  Culture, blood (single)     Status: None   Collection Time: 11/17/2014  1:10 PM  Result Value Ref Range Status   Specimen Description BLOOD RIGHT ANTECUBITAL  Final   Special Requests BOTTLES DRAWN AEROBIC ONLY 4CC  IMMUNE:COMPROMISED  Final   Culture NO GROWTH 6 DAYS  Final   Report Status 11/19/2014 FINAL  Final  MRSA PCR Screening     Status: None   Collection Time: 11/15/14  6:26 AM  Result Value Ref Range Status   MRSA by PCR NEGATIVE NEGATIVE Final    Comment:        The GeneXpert MRSA Assay (FDA approved for NASAL specimens only), is one component of a comprehensive MRSA colonization surveillance program. It is not intended to diagnose MRSA infection nor to guide or monitor treatment for MRSA infections.    Culture, respiratory (NON-Expectorated)     Status: None   Collection Time: 11/16/14  3:30 PM  Result Value Ref Range Status   Specimen Description TRACHEAL ASPIRATE  Final   Special Requests Normal  Final   Gram Stain   Final    FEW WBC PRESENT,BOTH PMN AND MONONUCLEAR RARE SQUAMOUS EPITHELIAL CELLS PRESENT RARE YEAST Performed at [facility]    Culture   Final    FEW YEAST CONSISTENT WITH CANDIDA SPECIES Performed at [facility]    Report Status 11/18/2014 FINAL  Final    Coagulation Studies: No results for input(s): LABPROT, INR in the last 72 hours.  Urinalysis: No results for input(s): COLORURINE, LABSPEC, PHURINE, GLUCOSEU, HGBUR, BILIRUBINUR, KETONESUR, PROTEINUR, UROBILINOGEN, NITRITE, LEUKOCYTESUR in the last 72 hours.  Invalid input(s): APPERANCEUR    Imaging: Dg Chest Port 1 View  11/22/2014   CLINICAL DATA:  Evaluate endotracheal tube position  EXAM: PORTABLE CHEST - 1 VIEW  COMPARISON:  Portable chest x-ray 11/21/2014  FINDINGS: The tip of the endotracheal tube is approximately 4.6 cm above the carina. There is little change in pulmonary vascular congestion, basilar atelectasis, possible small effusions. Left IJ central venous line remains. Heart size is stable.  IMPRESSION: Little change in pulmonary vascular congestion and possible small effusions with basilar atelectasis. Endotracheal tube tip is 4.6 cm above the carina.   Electronically Signed   By: Dwyane Dee M.D.   On: 11/22/2014 08:05   Dg Chest Port 1 View  11/21/2014   CLINICAL DATA:  Evaluate endotracheal tube  EXAM: PORTABLE CHEST - 1 VIEW  COMPARISON:  Portable chest x-ray of 11/20/2014  FINDINGS: The tip of the endotracheal tube is approximately 6.6 cm above the carina. Aeration has improved somewhat. There is persistent pulmonary vascular congestion and probable possible small effusions. Cardiomegaly is stable. Left central venous line tip overlies the mid upper SVC. Sclerotic lesions  again are noted in the left humeral head.  IMPRESSION: 1. Tip of endotracheal tube 6.6 cm above the carina. 2. Improved aeration but persistent pulmonary vascular congestion.   Electronically Signed   By: Dwyane Dee M.D.   On: 11/21/2014 07:56     Medications:   . amiodarone 30 mg/hr (11/22/14 0200)  . phenylephrine (NEO-SYNEPHRINE) Adult infusion 85 mcg/min (11/22/14 0930)  . dialysis replacement fluid (prismasate) 200 mL/hr at 11/21/14 1233  . dialysis replacement fluid (prismasate) 300 mL/hr at 11/22/14 0613  . dialysate (PRISMASATE)  2,000 mL/hr at 11/22/14 0958   . antiseptic oral rinse  7 mL Mouth Rinse QID  . bacitracin   Topical BID  . chlorhexidine  15 mL Mouth Rinse BID  . feeding supplement (PRO-STAT SUGAR FREE 64)  60 mL Per Tube QID  . feeding supplement (VITAL HIGH PROTEIN)  1,000 mL Per Tube Q24H  . ipratropium-albuterol  3 mL Nebulization Q6H  . metoprolol  5 mg Intravenous Q3H  . pantoprazole (PROTONIX) IV  40 mg Intravenous Q24H   acetaminophen (TYLENOL) oral liquid 160 mg/5 mL, fentaNYL, heparin, midazolam, sodium chloride, sodium chloride  Assessment/ Plan:  Paula Fletcher is a 80 y.o. F with PMH of 4L O2 dependent COPD and A.fib on chronic coumadin, She presented to [facility] on 01/05 for epistaxis, generalized weakness, and SOB. Epistaxis began roughly one day prior at 1230 and resolved spontaneously prior to re-starting again shortly thereafter. She reports that bleeding was "like a stream", primarily out of the right nare. Also believes that some blood went down her throat. In ED, she was found to have temp of 94 rectally, supratherapeutic INR at 7.64, anemia with Hgb 10, acute renal failure with SCr 3.90, hyperkalemia with K of 7. In addition, she was started on BiPAP which was later changed to [facility].    Acute renal failure Baseline chronic renal failure with a baseline creatinine of 1.8 . The BUN of 160 would be likely to be due to blood ingested from  nosebleed and a volume contracted state.   Hypotension most likely hypovolemia in the setting of bleeding   Anemia Epistaxis Patient could receive blood products Also had a coagulopathy from warfarin exposure   Patient tolerating fluid removal. Critical care would like to increase the rate of fluid removal and concur with the plan   LOS: 9 Paula Fletcher W  :32 AM

## 2014-11-22 NOTE — Consult Note (Signed)
WOC wound follow up Wound type: LE wounds.  Pt continues to have no open ulcerations on the RLE and her RLE does not have significant edema.  The LLE once I removed the multilayer compression wraps has 4-5 small crusted lesions, several continue to have some serosanguineous drainage. All appear stable.  Wound bed: mostly dark, dried blood on each ulcer, non necrotic, no purulence noted  Periwound: intact Dressing procedure/placement/frequency: Continue topical non-adherent and 4 layer compression wrap for venous stasis ulcerations.   [facility]  WOC team will follow along with you for weekly wound assessments.  Please notify me of any acute changes in the wounds or any new areas of concerns Armen PickupMelody Aiyanah Kalama RN,CWOCN [phone]

## 2014-11-22 NOTE — Progress Notes (Signed)
Hypoglycemic Event  CBG: 63  Treatment: D50 IV 25 mL  Symptoms: None  Follow-up CBG: [id number] CBG Result:193  Possible Reasons for Event: Unknown  Comments/MD notified:    Clare GandyLedbetter, Waverly Tarquinio Hannah  Remember to initiate Hypoglycemia Order Set & complete

## 2014-11-22 NOTE — Progress Notes (Signed)
NUTRITION FOLLOW-UP  DOCUMENTATION CODES Per approved criteria  -Morbid Obesity   INTERVENTION: Vital High Protein increase to 30 ml/hr via OG tube   60 ml Prostat TID.    Tube feeding regimen provides 1320 kcal (23 kcal/day of IBW), 153 grams of protein, and 601 ml of H2O.   NUTRITION DIAGNOSIS: Inadequate oral intake related to inability to eat as evidenced by NPO status; ongoing.   Goal: Enteral nutrition to provide 60-70% of estimated calorie needs (22-25 kcals/kg ideal body weight) and 100% of estimated protein needs, based on ASPEN guidelines for hypocaloric, high protein feeding in critically ill obese individuals; not met.   Monitor:  Respiratory status, TF tolerance, labs  ASSESSMENT: Pt with hx of COPD on O2 at home admitted for epistaxis. Pt decompensated and intubated 1/7.   Patient is currently intubated on ventilator support MV: 20 L/min Temp (24hrs), Avg:98.1 F (36.7 C), Min:97.7 F (36.5 C), Max:98.5 F (36.9 C)  Pt with OG tube, tip enters the stomach.  Pt discussed during ICU rounds and with RN.  Pt started on CVVHD for fluid removal.   Height: Ht Readings from Last 1 Encounters:  11/15/14 _0  (1.676 m)    Weight: Wt Readings from Last 1 Encounters:  11/22/14 263 lb 3.7 oz (119.4 kg)  Admission weight: 261 lb (118.4 kg) 1/8  Ideal Body Weight: 59 kg   BMI:  Body mass index is 42.51 kg/(m^2).  Estimated Nutritional Needs: Kcal: 2439  Protein: >/= 147 grams Fluid: > 1.5 L/day  Skin:  LLE venous statis ulcers  Diet Order: Diet NPO time specified   Intake/Output Summary (Last 24 hours) at 11/22/14 1430 Last data filed at 11/22/14 1400  Gross per 24 hour  Intake 1630.58 ml  Output   3741 ml  Net -2110.42 ml    Last BM: 1/14   Labs:   Recent Labs Lab 11/20/14 0540 11/21/14 0428 11/22/14 0540  NA 142 135 137  K 3.9 3.6 3.9  CL 110 100 102  CO2 _1 BUN 162* 99* 60*  CREATININE 2.96* 2.01* 1.45*  CALCIUM 8.3* 8.2*  8.3*  MG 2.0 2.2 2.2  PHOS 5.1* 3.5 2.4  GLUCOSE 163* 153* 134*    CBG (last 3)   Recent Labs  11/22/14 0824 11/22/14 0853 11/22/14 0901  GLUCAP 62* 45* 193*    Scheduled Meds: . antiseptic oral rinse  7 mL Mouth Rinse QID  . bacitracin   Topical BID  . chlorhexidine  15 mL Mouth Rinse BID  . feeding supplement (PRO-STAT SUGAR FREE 64)  60 mL Per Tube QID  . feeding supplement (VITAL HIGH PROTEIN)  1,000 mL Per Tube Q24H  . ipratropium-albuterol  3 mL Nebulization Q6H  . metoprolol  5 mg Intravenous Q3H  . pantoprazole (PROTONIX) IV  40 mg Intravenous Q24H    Continuous Infusions: . amiodarone 30 mg/hr (11/22/14 0200)  . phenylephrine (NEO-SYNEPHRINE) Adult infusion 85 mcg/min (11/22/14 1400)  . dialysis replacement fluid (prismasate) 200 mL/hr at 11/21/14 1233  . dialysis replacement fluid (prismasate) 300 mL/hr at 11/22/14 0613  . dialysate (PRISMASATE) 2,000 mL/hr at 11/22/14 [place], LDN, [place] Pager [phone] After Hours Pager

## 2014-11-23 ENCOUNTER — Inpatient Hospital Stay (HOSPITAL_COMMUNITY): Payer: Medicare FFS

## 2014-11-23 LAB — BASIC METABOLIC PANEL
ANION GAP: 7 (ref 5–15)
BUN: 53 mg/dL — ABNORMAL HIGH (ref 6–23)
CALCIUM: 7.9 mg/dL — AB (ref 8.4–10.5)
CO2: 25 mmol/L (ref 19–32)
Chloride: 102 mEq/L (ref 96–112)
Creatinine, Ser: 1.33 mg/dL — ABNORMAL HIGH (ref 0.50–1.10)
GFR calc Af Amer: 43 mL/min — ABNORMAL LOW (ref >90)
GFR, EST NON AFRICAN AMERICAN: 37 mL/min — AB (ref >90)
Glucose, Bld: 146 mg/dL — ABNORMAL HIGH (ref 70–99)
Potassium: 3.9 mmol/L (ref 3.5–5.1)
Sodium: 134 mmol/L — ABNORMAL LOW (ref 135–145)

## 2014-11-23 LAB — POCT ACTIVATED CLOTTING TIME
ACTIVATED CLOTTING TIME: 134 s
ACTIVATED CLOTTING TIME: 140 s
ACTIVATED CLOTTING TIME: 147 s
Activated Clotting Time: 146 seconds

## 2014-11-23 LAB — MAGNESIUM: Magnesium: 2.4 mg/dL (ref 1.5–2.5)

## 2014-11-23 LAB — CBC
HCT: 25.9 % — ABNORMAL LOW (ref 36.0–46.0)
HEMOGLOBIN: 8.4 g/dL — AB (ref 12.0–15.0)
MCH: 32.1 pg (ref 26.0–34.0)
MCHC: 32.4 g/dL (ref 30.0–36.0)
MCV: 98.9 fL (ref 78.0–100.0)
Platelets: 180 10*3/uL (ref 150–400)
RBC: 2.62 MIL/uL — AB (ref 3.87–5.11)
RDW: 17.1 % — ABNORMAL HIGH (ref 11.5–15.5)
WBC: 15.5 10*3/uL — ABNORMAL HIGH (ref 4.0–10.5)

## 2014-11-23 LAB — PHOSPHORUS: PHOSPHORUS: 2.3 mg/dL (ref 2.3–4.6)

## 2014-11-23 LAB — BLOOD GAS, ARTERIAL
Acid-base deficit: 0.5 mmol/L (ref 0.0–2.0)
Bicarbonate: 24.1 mEq/L — ABNORMAL HIGH (ref 20.0–24.0)
DRAWN BY: 39898
FIO2: 0.6 %
LHR: 20 {breaths}/min
O2 SAT: 90 %
PCO2 ART: 41.1 mmHg (ref 35.0–45.0)
PEEP: 15 cmH2O
PRESSURE CONTROL: 20 cmH2O
Patient temperature: 97.4
TCO2: 25.4 mmol/L (ref 0–100)
pH, Arterial: 7.382 (ref 7.350–7.450)
pO2, Arterial: 52.8 mmHg — ABNORMAL LOW (ref 80.0–100.0)

## 2014-11-23 LAB — GLUCOSE, CAPILLARY
GLUCOSE-CAPILLARY: 116 mg/dL — AB (ref 70–99)
GLUCOSE-CAPILLARY: 165 mg/dL — AB (ref 70–99)
GLUCOSE-CAPILLARY: 166 mg/dL — AB (ref 70–99)
Glucose-Capillary: 108 mg/dL — ABNORMAL HIGH (ref 70–99)
Glucose-Capillary: 115 mg/dL — ABNORMAL HIGH (ref 70–99)
Glucose-Capillary: 118 mg/dL — ABNORMAL HIGH (ref 70–99)
Glucose-Capillary: 120 mg/dL — ABNORMAL HIGH (ref 70–99)
Glucose-Capillary: 130 mg/dL — ABNORMAL HIGH (ref 70–99)
Glucose-Capillary: 147 mg/dL — ABNORMAL HIGH (ref 70–99)

## 2014-11-23 LAB — CLOSTRIDIUM DIFFICILE BY PCR: CDIFFPCR: NEGATIVE

## 2014-11-23 MED ORDER — HEPARIN SODIUM (PORCINE) 1000 UNIT/ML DIALYSIS
1000.0000 [IU] | INTRAMUSCULAR | Status: DC | PRN
  Filled 2014-11-23 (×2): qty 6

## 2014-11-23 MED ORDER — SODIUM CHLORIDE 0.9 % IJ SOLN
250.0000 [IU]/h | INTRAMUSCULAR | Status: DC
  Administered 2014-11-23: 250 [IU]/h via INTRAVENOUS_CENTRAL
  Administered 2014-11-23: 1350 [IU]/h via INTRAVENOUS_CENTRAL
  Administered 2014-11-24: 2050 [IU]/h via INTRAVENOUS_CENTRAL
  Administered 2014-11-24 (×2): 2300 [IU]/h via INTRAVENOUS_CENTRAL
  Administered 2014-11-24: 2350 [IU]/h via INTRAVENOUS_CENTRAL
  Administered 2014-11-25: 2300 [IU]/h via INTRAVENOUS_CENTRAL
  Filled 2014-11-23 (×8): qty 2

## 2014-11-23 MED ORDER — LOPERAMIDE HCL 1 MG/5ML PO LIQD
4.0000 mg | Freq: Once | ORAL | Status: AC
  Administered 2014-11-23: 4 mg
  Filled 2014-11-23: qty 20

## 2014-11-23 MED ORDER — HEPARIN BOLUS VIA INFUSION (CRRT)
1000.0000 [IU] | INTRAVENOUS | Status: DC | PRN
  Administered 2014-11-23 (×3): 1000 [IU] via INTRAVENOUS_CENTRAL
  Filled 2014-11-23 (×4): qty 1000

## 2014-11-23 MED ORDER — LOPERAMIDE HCL 1 MG/5ML PO LIQD
2.0000 mg | ORAL | Status: DC | PRN
  Filled 2014-11-23: qty 10

## 2014-11-23 NOTE — Procedures (Signed)
Arterial Catheter Insertion Procedure Note Paula Fletcher [id number] [birth year]  Procedure: Insertion of Arterial Catheter  Indications: Blood pressure monitoring and Frequent blood sampling  Procedure Details Consent: Unable to obtain consent because of patient on ventilator.. Time Out: Verified patient identification, verified procedure, site/side was marked, verified correct patient position, special equipment/implants available, medications/allergies/relevent history reviewed, required imaging and test results available.  Performed  Maximum sterile technique was used including antiseptics, cap, gloves, gown, hand hygiene, mask and sheet. Skin prep: Chlorhexidine; local anesthetic administered 20 gauge catheter was inserted into left radial artery using the Seldinger technique.  Evaluation Blood flow good; BP tracing good. Complications: No apparent complications   Placed new arterial line per MD due to old one having come out of patient.  Paula Fletcher.   Paula Fletcher, Paula Fletcher 11/23/2014

## 2014-11-23 NOTE — Progress Notes (Signed)
PULMONARY / CRITICAL CARE MEDICINE   Name: Paula Fletcher MRN: [id number] DOB: 11-Jul-1935    ADMISSION DATE:  11/12/2014 CONSULTATION DATE:  11/23/2014  REFERRING MD :  [facility]  CHIEF COMPLAINT:  Nose bleed, generalized weakness, SOB  INITIAL PRESENTATION:   Paula Fletcher is a 80 y.o. F with PMH of 4L O2 dependent COPD and A.fib on chronic coumadin, She presented to [facility] on 01/05 for epistaxis, generalized weakness, and SOB.  Epistaxis began roughly one day prior at 1230 and resolved spontaneously prior to re-starting again shortly thereafter.  She reports that bleeding was "like a stream", primarily out of the right nare.  Also believes that some blood went down her throat. In ED, she was found to have temp of 94 rectally, supratherapeutic INR at 7.64, anemia with Hgb 10, acute renal failure with SCr 3.90, hyperkalemia with K of 7.  In addition, she was started on BiPAP which was later changed to [facility].  Request was made to transfer to [facility] ICU for concerns that pt may deteriorate.  PCCM consulted for admission.  On arrival to [facility], pt noted to still be slowly bleeding from b/l nares as well as new bleeding from foley site.  STUDIES:  01/05 CT Head: no acute abnormality, opacification of right nasal cavity and nasopharynx, may be related to epistaxis. 01/05 - admitted to [facility] ICU 11/14/14: Still with intermittent very mild slow epistaxis. Hungry . Making urine.  ENT consult - Conservative Rx. Repeat FFP 11/15/14 - progressive hypoxemic resp faiure despite lasix ,INTUBATED +. CENTRAL LINE + 1/10: requiring higher FIO2/Peep/ renal fxn improved. Off pressors.  1/11 higher need for O2 today with increased PEEP.  Very agitated this AM with high RR in the high 30s. 1/12 serum Cr deteriorating and UOP dropping with drop in BP.  SUBJECTIVE/OVERNIGHT/INTERVAL HX Tolerating volume negative well, much more alert and interactive.  VITAL SIGNS: Temp:  [95 F (35 C)-98 F (36.7 C)] 95 F (35 C) (01/15  0800) Pulse Rate:  [30-98] 82 (01/15 1030) Resp:  [15-30] 25 (01/15 1030) BP: (99-166)/(29-122) 123/44 mmHg (01/15 1030) SpO2:  [76 %-100 %] 96 % (01/15 1030) Arterial Line BP: (67-213)/(39-97) 67/63 mmHg (01/15 0830) FiO2 (%):  [50 %-80 %] 50 % (01/15 0800) Weight:  [115.5 kg (254 lb 10.1 oz)] 115.5 kg (254 lb 10.1 oz) (01/15 0600)   HEMODYNAMICS: CVP:  [12 mmHg-26 mmHg] 20 mmHg  VENTILATOR SETTINGS: Vent Mode:  [-] PCV FiO2 (%):  [50 %-80 %] 50 % Set Rate:  [20 bmp] 20 bmp PEEP:  [15 cmH20] 15 cmH20 Plateau Pressure:  [26 cmH20-41 cmH20] 36 cmH20  INTAKE / OUTPUT: Intake/Output      01/14 0701 - 01/15 0700 01/15 0701 - 01/16 0700   I.V. (mL/kg) 1104.4 (9.6) 115 (1)   Other 95 20   NG/GT 600 140   Total Intake(mL/kg) 1799.4 (15.6) 275 (2.4)   Urine (mL/kg/hr) 37 (0) 0 (0)   Other 4218 (1.5) 485 (1.1)   Stool 0 (0)    Blood     Total Output 4255 485   Net -2455.6 -210        Stool Occurrence 6 x     PHYSICAL EXAMINATION: General: Much more alert and interactive. Neuro: Awake. Moving all ext to command. HEENT: ET tube +. No active bleeding oropharynx. Cardiovascular: RRR, no M/R/G.  Lungs: scattered rhonchi, rales  Abdomen: BS x 4, soft, NT/ND.  Ext: Chronic stasis changes, trace b/l edema, multiple superficial ulcers on  LLE. In ACE wrap  Skin: Intact, warm, no rashes.  LABS: PULMONARY  Recent Labs Lab 11/20/14 0337 11/20/14 1103 11/21/14 0337 11/22/14 0337 11/23/14 0500  PHART 7.314* 7.311* 7.379 7.415 7.382  PCO2ART 41.5 37.8 37.9 38.6 41.1  PO2ART 64.9* 66.0* 61.0* 100.0 52.8*  HCO3 20.5 19.0* 21.9 24.2* 24.1*  TCO2 21.7 20 23.1 25.3 25.4  O2SAT 92.4 91.0 90.1 97.4 90.0   CBC  Recent Labs Lab 11/21/14 0428 11/22/14 0540 11/23/14 0600  HGB 8.3* 8.1* 8.4*  HCT 26.2* 25.8* 25.9*  WBC 11.4* 13.3* 15.5*  PLT 183 211 180   COAGULATION No results for input(s): INR in the last 168 hours. CARDIAC No results for input(s): TROPONINI in the last 168  hours. No results for input(s): PROBNP in the last 168 hours.  CHEMISTRY  Recent Labs Lab 11/18/14 0530 11/19/14 0545 11/20/14 0540 11/21/14 0428 11/22/14 0540 11/23/14 0600  NA 147* 147* 142 135 137 134*  K 4.2 4.0 3.9 3.6 3.9 3.9  CL 115* 116* 110 100 102 102  CO2 GLUCOSE 164* 144* 163* 153* 134* 146*  BUN 123* 127* 162* 99* 60* 53*  CREATININE 2.73* 2.50* 2.96* 2.01* 1.45* 1.33*  CALCIUM 9.0 8.8 8.3* 8.2* 8.3* 7.9*  MG 2.2  --  2.0 2.2 2.2 2.4  PHOS 3.2  --  5.1* 3.5 2.4 2.3    Estimated Creatinine Clearance: 45.7 mL/min (by C-G formula based on Cr of 1.33).     LIVER  Recent Labs Lab 11/18/14 0530 11/19/14 0545  AST 32 33  ALT 23 26  ALKPHOS 50 51  BILITOT 0.5 0.8  PROT 5.7* 5.8*  ALBUMIN 2.5* 2.3*   INFECTIOUS  Recent Labs Lab 11/17/14 0330 11/18/14 0530  PROCALCITON 0.19 0.46   ENDOCRINE CBG (last 3)   Recent Labs  11/22/14 2013 11/22/14 2342 11/23/14 0806  GLUCAP 118* 165* 108*   IMAGING x48h Dg Chest Port 1 View  11/23/2014   CLINICAL DATA:  Followup endotracheal tube  EXAM: PORTABLE CHEST - 1 VIEW  COMPARISON:  11/22/2013  FINDINGS: Cardiomediastinal silhouette is stable. Endotracheal tube in place with tip 3.6 cm above the carina. NG tube is unchanged in position. Stable left IJ central line position. Persistent central vascular congestion and mild interstitial prominence probable mild edema with slight improvement in aeration. Small bilateral basilar atelectasis or infiltrate.  IMPRESSION: Stable support apparatus. Persistent central vascular congestion and mild interstitial prominence with slight improvement in aeration. Small bilateral basilar atelectasis or infiltrate.   Electronically Signed   By: Natasha Mead M.D.   On: 11/23/2014 08:06   Dg Chest Port 1 View  11/22/2014   CLINICAL DATA:  Evaluate endotracheal tube position  EXAM: PORTABLE CHEST - 1 VIEW  COMPARISON:  Portable chest x-ray 11/21/2014  FINDINGS: The tip of  the endotracheal tube is approximately 4.6 cm above the carina. There is little change in pulmonary vascular congestion, basilar atelectasis, possible small effusions. Left IJ central venous line remains. Heart size is stable.  IMPRESSION: Little change in pulmonary vascular congestion and possible small effusions with basilar atelectasis. Endotracheal tube tip is 4.6 cm above the carina.   Electronically Signed   By: Dwyane Dee M.D.   On: 11/22/2014 08:05   ASSESSMENT / PLAN:  PULMONARY A: Acute on Chronic Hypoxic Respiratory failure: favor aspiration w/ ALI vs TRALI +/- element of edema: PCT neg w/ reassuring sputum off abx  Moderate COPD Pulmonary nodule - 7mm RLL  seen on chest CT Feb 2015. No sig improvement. Actually needing higher FIO2/Peep.  Concern for massive aspiration of blood. Without adequate sedation due to high PEEP patient is highly agitated. P:   Full vent support - decrease PEEP to 10 and titrate FiO2 down as able. Volume negative as below via CRRT. DuoNebs scheduled. CXR in AM. See ID section.  CARDIOVASCULAR A:  AF w/ RVR  Risk of hyperkalemia induced arrhythmias Chronic diastolic heart failure - on opd  Hx HTN, HLD OPD meds:  lasix, losartan, lopressor, nifedipine, rosuvastatin for now. >propofol induced hypotension. P:  Amio gtt seems to be the most effective way of rate control in this patient, highly doubt amiodarone lung is remotely a factor here, will continue. No anticoagulation. Neo for BP support inorder to continue volume negative. Negative Volume status.  RENAL A:   AKI, nonoliguric-->improving  Hypernatremia  Chronic edema Volume overload  P:   Renal consult appreciated.  No more ARB. Monitor BMET intermittently. Replace electrolytes as neded. CVVH out to 100 ml/hr and can adjust neo as needed. Monitor I/Os. D/Ced free water. Saline lock.  GASTROINTESTINAL A:   NO acute issues P:   SUP: IV PPI Cont tube feeds   HEMATOLOGIC A:    Warfarin induced coagulopathy - with septra interaction prior to admit-->INR better  Acute blood loss anemia-->hgb stable  Epistaxis VTE Prophylaxis  - s/p FFP complete 11/22/2014 and 11/14/14  P:  DVT px: SCD Monitor CBC intermittently Transfuse RBCs for Hgb < 7.0 or hemorrhagic shock - not indicated presently A very poor candidate for long term warfarin  INFECTIOUS A:   Chronic venous stasis ulcers LLE Pulmonary infiltrates - worrisome for aspiration PNA  P:   Blood Cx 01/05 >>> NEG Sputum Cx 01/05: few wbc>>> Follow off abx for now.  ENDOCRINE A:   Hyperglycemia without documented hx of DM P:   Consider SSI if glucose > 180.  NEUROLOGIC A:   Chronic pain   - on opd gabapentin and norco 11/16/14 on fent gtt Precedex was completely ineffective in this patient even with high doses. P:   RASS goal 0 to -2 Precedex completely ineffective and will not be restarted again. Minimize sedation to pushes given sensitivity to narcotics.  DERM/MSK A: Chronic LLE distal venous stasis ulcer 2x 2cm . [facility]  - per wound care  Family updated:  No family bedside 1/15.  GLOBAL FiO2 down to 50%, decrease PEEP to 10, continue volume negative, monitor off abx, if able to get to 40% and 5 PEEP will consider beginning weaning trials.  The patient is critically ill with multiple organ systems failure and requires high complexity decision making for assessment and support, frequent evaluation and titration of therapies, application of advanced monitoring technologies and extensive interpretation of multiple databases.   Critical Care Time devoted to patient care services described in this note is  35  Minutes. This time reflects time of care of this signee Dr Koren BoundWesam Trejan Buda. This critical care time does not reflect procedure time, or teaching time or supervisory time of PA/NP/Med student/Med Resident etc but could involve care  discussion time.  Alyson ReedyWesam G. Kaimana Neuzil, M.D. [facility] Pulmonary/Critical Care Medicine. Pager: [phone]. After hours pager: [phone].

## 2014-11-23 NOTE — Progress Notes (Signed)
[facility] ROUNDING NOTE   Subjective:   Interval History:  Fluid removal with CRRT   Objective:  Vital signs in last 24 hours:  Temp:  [95 F (35 C)-98 F (36.7 C)] 97.3 F (36.3 C) (01/15 1230) Pulse Rate:  [30-96] 93 (01/15 1300) Resp:  [15-34] 34 (01/15 1300) BP: (99-166)/(29-101) 143/101 mmHg (01/15 1300) SpO2:  [76 %-100 %] 94 % (01/15 1300) Arterial Line BP: (67-213)/(39-97) 67/63 mmHg (01/15 0830) FiO2 (%):  [40 %-80 %] 40 % (01/15 1300) Weight:  [115.5 kg (254 lb 10.1 oz)] 115.5 kg (254 lb 10.1 oz) (01/15 0600)  Weight change: -3.9 kg (-8 lb 9.6 oz) Filed Weights   11/21/14 0500 11/22/14 0500 11/23/14 0600  Weight: 121 kg (266 lb 12.1 oz) 119.4 kg (263 lb 3.7 oz) 115.5 kg (254 lb 10.1 oz)    Intake/Output: I/O last 3 completed shifts: In: 2528.1 [I.V.:1607.1; Other:141; NG/GT:780] Out: 6017 [Urine:71; Other:5946]   Intake/Output this shift:  Total I/O In: 471.5 [I.V.:221.5; Other:20; NG/GT:230] Out: 833 [Urine:1; Other:792; Stool:40]  CVS- RRR RS- CTA ABD- BS present soft non-distended EXT- no edema   Basic Metabolic Panel:  Recent Labs Lab 11/18/14 0530 11/19/14 0545 11/20/14 0540 11/21/14 0428 11/22/14 0540 11/23/14 0600  NA 147* 147* 142 135 137 134*  K 4.2 4.0 3.9 3.6 3.9 3.9  CL 115* 116* 110 100 102 102  CO2 GLUCOSE 164* 144* 163* 153* 134* 146*  BUN 123* 127* 162* 99* 60* 53*  CREATININE 2.73* 2.50* 2.96* 2.01* 1.45* 1.33*  CALCIUM 9.0 8.8 8.3* 8.2* 8.3* 7.9*  MG 2.2  --  2.0 2.2 2.2 2.4  PHOS 3.2  --  5.1* 3.5 2.4 2.3    Liver Function Tests:  Recent Labs Lab 11/18/14 0530 11/19/14 0545  AST 32 33  ALT 23 26  ALKPHOS 50 51  BILITOT 0.5 0.8  PROT 5.7* 5.8*  ALBUMIN 2.5* 2.3*   No results for input(s): LIPASE, AMYLASE in the last 168 hours. No results for input(s): AMMONIA in the last 168 hours.  CBC:  Recent Labs Lab 11/17/14 0330  11/18/14 0530 11/19/14 0545 11/20/14 0540  11/21/14 0428 11/22/14 0540 11/23/14 0600  WBC 9.4  9.2  < > 8.6 7.9 8.0 11.4* 13.3* 15.5*  NEUTROABS 7.5  --  7.3  --   --   --   --   --   HGB 7.7*  7.7*  < > 7.8* 8.1* 8.0* 8.3* 8.1* 8.4*  HCT 24.7*  25.2*  < > 25.2* 26.3* 25.5* 26.2* 25.8* 25.9*  MCV 101.2*  101.2*  < > 100.0 100.8* 104.5* 99.2 101.6* 98.9  PLT 147*  143*  < > 147* 139* 165 183 211 180  < > = values in this interval not displayed.  Cardiac Enzymes: No results for input(s): CKTOTAL, CKMB, CKMBINDEX, TROPONINI in the last 168 hours.  BNP: Invalid input(s): POCBNP  CBG:  Recent Labs Lab 11/22/14 1613 11/22/14 2013 11/22/14 2342 11/23/14 0806 11/23/14 1239  GLUCAP 116* 118* 165* 108* 130*    Microbiology: Results for orders placed or performed during the hospital encounter of 11/09/2014  Culture, blood (single)     Status: None   Collection Time: 12/01/2014 11:13 AM  Result Value Ref Range Status   Specimen Description BLOOD RIGHT ANTECUBITAL  Final   Special Requests BOTTLES DRAWN AEROBIC AND ANAEROBIC 6CC  Final   Culture NO GROWTH 6 DAYS  Final   Report Status  11/19/2014 FINAL  Final  Culture, blood (single)     Status: None   Collection Time: 12/19/2014  1:10 PM  Result Value Ref Range Status   Specimen Description BLOOD RIGHT ANTECUBITAL  Final   Special Requests BOTTLES DRAWN AEROBIC ONLY 4CC  IMMUNE:COMPROMISED  Final   Culture NO GROWTH 6 DAYS  Final   Report Status 11/19/2014 FINAL  Final  MRSA PCR Screening     Status: None   Collection Time: 11/15/14  6:26 AM  Result Value Ref Range Status   MRSA by PCR NEGATIVE NEGATIVE Final    Comment:        The GeneXpert MRSA Assay (FDA approved for NASAL specimens only), is one component of a comprehensive MRSA colonization surveillance program. It is not intended to diagnose MRSA infection nor to guide or monitor treatment for MRSA infections.   Culture, respiratory (NON-Expectorated)     Status: None   Collection Time: 11/16/14  3:30 PM   Result Value Ref Range Status   Specimen Description TRACHEAL ASPIRATE  Final   Special Requests Normal  Final   Gram Stain   Final    FEW WBC PRESENT,BOTH PMN AND MONONUCLEAR RARE SQUAMOUS EPITHELIAL CELLS PRESENT RARE YEAST Performed at [facility]    Culture   Final    FEW YEAST CONSISTENT WITH CANDIDA SPECIES Performed at [facility]    Report Status 11/18/2014 FINAL  Final  Clostridium Difficile by PCR     Status: None   Collection Time: 11/23/14  5:50 AM  Result Value Ref Range Status   C difficile by pcr NEGATIVE NEGATIVE Final    Coagulation Studies: No results for input(s): LABPROT, INR in the last 72 hours.  Urinalysis: No results for input(s): COLORURINE, LABSPEC, PHURINE, GLUCOSEU, HGBUR, BILIRUBINUR, KETONESUR, PROTEINUR, UROBILINOGEN, NITRITE, LEUKOCYTESUR in the last 72 hours.  Invalid input(s): APPERANCEUR    Imaging: Dg Chest Port 1 View  11/23/2014   CLINICAL DATA:  Followup endotracheal tube  EXAM: PORTABLE CHEST - 1 VIEW  COMPARISON:  11/22/2013  FINDINGS: Cardiomediastinal silhouette is stable. Endotracheal tube in place with tip 3.6 cm above the carina. NG tube is unchanged in position. Stable left IJ central line position. Persistent central vascular congestion and mild interstitial prominence probable mild edema with slight improvement in aeration. Small bilateral basilar atelectasis or infiltrate.  IMPRESSION: Stable support apparatus. Persistent central vascular congestion and mild interstitial prominence with slight improvement in aeration. Small bilateral basilar atelectasis or infiltrate.   Electronically Signed   By: Natasha MeadLiviu  Pop M.D.   On: 11/23/2014 08:06   Dg Chest Port 1 View  11/22/2014   CLINICAL DATA:  Evaluate endotracheal tube position  EXAM: PORTABLE CHEST - 1 VIEW  COMPARISON:  Portable chest x-ray 11/21/2014  FINDINGS: The tip of the endotracheal tube is approximately 4.6 cm above the carina. There is little change in  pulmonary vascular congestion, basilar atelectasis, possible small effusions. Left IJ central venous line remains. Heart size is stable.  IMPRESSION: Little change in pulmonary vascular congestion and possible small effusions with basilar atelectasis. Endotracheal tube tip is 4.6 cm above the carina.   Electronically Signed   By: Dwyane DeePaul  Barry M.D.   On: 11/22/2014 08:05     Medications:   . amiodarone 30 mg/hr (11/23/14 0103)  . phenylephrine (NEO-SYNEPHRINE) Adult infusion 50 mcg/min (11/23/14 1000)  . dialysis replacement fluid (prismasate) 200 mL/hr at 11/21/14 1233  . dialysis replacement fluid (prismasate) 300 mL/hr at 11/22/14 1818  .  dialysate (PRISMASATE) 2,000 mL/hr at 11/23/14 1043   . antiseptic oral rinse  7 mL Mouth Rinse QID  . bacitracin   Topical BID  . chlorhexidine  15 mL Mouth Rinse BID  . feeding supplement (PRO-STAT SUGAR FREE 64)  60 mL Per Tube TID  . feeding supplement (VITAL HIGH PROTEIN)  1,000 mL Per Tube Q24H  . ipratropium-albuterol  3 mL Nebulization Q6H  . pantoprazole (PROTONIX) IV  40 mg Intravenous Q24H   acetaminophen (TYLENOL) oral liquid 160 mg/5 mL, fentaNYL, heparin, midazolam, sodium chloride, sodium chloride  Assessment/ Plan:  Paula Fletcher is a 80 y.o. F with PMH of 4L O2 dependent COPD and A.fib on chronic coumadin, She presented to [facility] on 01/05 for epistaxis, generalized weakness, and SOB. Epistaxis began roughly one day prior at 1230 and resolved spontaneously prior to re-starting again shortly thereafter. She reports that bleeding was "like a stream", primarily out of the right nare. Also believes that some blood went down her throat. In ED, she was found to have temp of 94 rectally, supratherapeutic INR at 7.64, anemia with Hgb 10, acute renal failure with SCr 3.90, hyperkalemia with K of 7. In addition, she was started on BiPAP which was later changed to [facility].    Acute renal failure Baseline chronic renal failure with a  baseline creatinine of 1.8 . The BUN of 160 would be likely to be due to blood ingested from nosebleed and a volume contracted state.   Hypotension most likely hypovolemia in the setting of bleeding   Anemia Epistaxis Patient could receive blood products Also had a coagulopathy from warfarin exposure   Patient tolerating fluid removal. Critical care would like to increase the rate of fluid removal and concur with the plan. Will continue to remove fluid      LOS: 10 Tymira Horkey W  :20 PM

## 2014-11-24 ENCOUNTER — Inpatient Hospital Stay (HOSPITAL_COMMUNITY): Payer: Medicare FFS

## 2014-11-24 DIAGNOSIS — J80 Acute respiratory distress syndrome: Secondary | ICD-10-CM | POA: Diagnosis not present

## 2014-11-24 LAB — CBC
HEMATOCRIT: 25.7 % — AB (ref 36.0–46.0)
HEMOGLOBIN: 8.3 g/dL — AB (ref 12.0–15.0)
MCH: 32.3 pg (ref 26.0–34.0)
MCHC: 32.3 g/dL (ref 30.0–36.0)
MCV: 100 fL (ref 78.0–100.0)
Platelets: 224 10*3/uL (ref 150–400)
RBC: 2.57 MIL/uL — ABNORMAL LOW (ref 3.87–5.11)
RDW: 17.5 % — AB (ref 11.5–15.5)
WBC: 13.6 10*3/uL — ABNORMAL HIGH (ref 4.0–10.5)

## 2014-11-24 LAB — GLUCOSE, CAPILLARY
GLUCOSE-CAPILLARY: 149 mg/dL — AB (ref 70–99)
GLUCOSE-CAPILLARY: 143 mg/dL — AB (ref 70–99)
GLUCOSE-CAPILLARY: 153 mg/dL — AB (ref 70–99)
Glucose-Capillary: 148 mg/dL — ABNORMAL HIGH (ref 70–99)
Glucose-Capillary: 164 mg/dL — ABNORMAL HIGH (ref 70–99)
Glucose-Capillary: 175 mg/dL — ABNORMAL HIGH (ref 70–99)

## 2014-11-24 LAB — BASIC METABOLIC PANEL
Anion gap: 5 (ref 5–15)
BUN: 50 mg/dL — ABNORMAL HIGH (ref 6–23)
CHLORIDE: 102 meq/L (ref 96–112)
CO2: 30 mmol/L (ref 19–32)
Calcium: 8.1 mg/dL — ABNORMAL LOW (ref 8.4–10.5)
Creatinine, Ser: 1.25 mg/dL — ABNORMAL HIGH (ref 0.50–1.10)
GFR calc non Af Amer: 40 mL/min — ABNORMAL LOW (ref >90)
GFR, EST AFRICAN AMERICAN: 47 mL/min — AB (ref >90)
Glucose, Bld: 149 mg/dL — ABNORMAL HIGH (ref 70–99)
POTASSIUM: 3.8 mmol/L (ref 3.5–5.1)
Sodium: 137 mmol/L (ref 135–145)

## 2014-11-24 LAB — PHOSPHORUS: PHOSPHORUS: 1.7 mg/dL — AB (ref 2.3–4.6)

## 2014-11-24 LAB — POCT ACTIVATED CLOTTING TIME
ACTIVATED CLOTTING TIME: 159 s
ACTIVATED CLOTTING TIME: 153 s
ACTIVATED CLOTTING TIME: 159 s
ACTIVATED CLOTTING TIME: 159 s
ACTIVATED CLOTTING TIME: 165 s
ACTIVATED CLOTTING TIME: 165 s
ACTIVATED CLOTTING TIME: 171 s
ACTIVATED CLOTTING TIME: 165 s
ACTIVATED CLOTTING TIME: 183 s
ACTIVATED CLOTTING TIME: 183 s
ACTIVATED CLOTTING TIME: 196 s
ACTIVATED CLOTTING TIME: 214 s
Activated Clotting Time: 159 seconds
Activated Clotting Time: 165 seconds
Activated Clotting Time: 165 seconds
Activated Clotting Time: 171 seconds
Activated Clotting Time: 171 seconds
Activated Clotting Time: 177 seconds
Activated Clotting Time: 183 seconds
Activated Clotting Time: 196 seconds
Activated Clotting Time: 202 seconds
Activated Clotting Time: 214 seconds
Activated Clotting Time: 214 seconds
Activated Clotting Time: 227 seconds

## 2014-11-24 LAB — BLOOD GAS, ARTERIAL
Acid-Base Excess: 1.2 mmol/L (ref 0.0–2.0)
Bicarbonate: 24.7 mEq/L — ABNORMAL HIGH (ref 20.0–24.0)
DRAWN BY: 39898
FIO2: 0.6 %
O2 Saturation: 96.2 %
PEEP: 10 cmH2O
PRESSURE CONTROL: 20 cmH2O
Patient temperature: 98.6
RATE: 20 resp/min
TCO2: 25.8 mmol/L (ref 0–100)
pCO2 arterial: 35.3 mmHg (ref 35.0–45.0)
pH, Arterial: 7.459 — ABNORMAL HIGH (ref 7.350–7.450)
pO2, Arterial: 80.6 mmHg (ref 80.0–100.0)

## 2014-11-24 LAB — POCT I-STAT 3, ART BLOOD GAS (G3+)
Acid-base deficit: 6 mmol/L — ABNORMAL HIGH (ref 0.0–2.0)
Bicarbonate: 21 mEq/L (ref 20.0–24.0)
O2 SAT: 89 %
Patient temperature: 93.8
TCO2: 22 mmol/L (ref 0–100)
pCO2 arterial: 40.2 mmHg (ref 35.0–45.0)
pH, Arterial: 7.312 — ABNORMAL LOW (ref 7.350–7.450)
pO2, Arterial: 54 mmHg — ABNORMAL LOW (ref 80.0–100.0)

## 2014-11-24 LAB — MAGNESIUM: MAGNESIUM: 2.4 mg/dL (ref 1.5–2.5)

## 2014-11-24 LAB — APTT: aPTT: 77 seconds — ABNORMAL HIGH (ref 24–37)

## 2014-11-24 MED ORDER — NOREPINEPHRINE BITARTRATE 1 MG/ML IV SOLN
2.0000 ug/min | INTRAVENOUS | Status: DC
  Administered 2014-11-24: 2 ug/min via INTRAVENOUS
  Filled 2014-11-24: qty 4

## 2014-11-24 MED ORDER — MIDAZOLAM HCL 2 MG/2ML IJ SOLN
4.0000 mg | INTRAMUSCULAR | Status: DC | PRN
  Administered 2014-11-24: 4 mg via INTRAVENOUS
  Administered 2014-11-24: 2 mg via INTRAVENOUS
  Administered 2014-11-25: 4 mg via INTRAVENOUS
  Filled 2014-11-24 (×3): qty 4

## 2014-11-24 MED ORDER — MIDAZOLAM HCL 2 MG/2ML IJ SOLN
INTRAMUSCULAR | Status: AC
  Filled 2014-11-24: qty 2

## 2014-11-24 MED ORDER — SODIUM PHOSPHATE 3 MMOLE/ML IV SOLN: 30.0000 mmol | Freq: Once | INTRAVENOUS | Status: DC

## 2014-11-24 MED ORDER — AMIODARONE HCL 200 MG PO TABS
200.0000 mg | ORAL_TABLET | Freq: Every day | ORAL | Status: DC
  Administered 2014-11-24: 200 mg via ORAL
  Filled 2014-11-24 (×2): qty 1

## 2014-11-24 MED ORDER — FENTANYL CITRATE 0.05 MG/ML IJ SOLN
100.0000 ug | INTRAMUSCULAR | Status: DC | PRN
  Administered 2014-11-24 – 2014-11-25 (×4): 100 ug via INTRAVENOUS
  Filled 2014-11-24 (×4): qty 2

## 2014-11-24 MED ORDER — FENTANYL CITRATE 0.05 MG/ML IJ SOLN
25.0000 ug | INTRAMUSCULAR | Status: DC | PRN
  Administered 2014-11-24 (×4): 50 ug via INTRAVENOUS
  Filled 2014-11-24 (×4): qty 2

## 2014-11-24 MED ORDER — DEXTROSE 5 % IV SOLN
30.0000 mmol | Freq: Once | INTRAVENOUS | Status: AC
  Administered 2014-11-24: 30 mmol via INTRAVENOUS
  Filled 2014-11-24: qty 10

## 2014-11-24 NOTE — Progress Notes (Signed)
PULMONARY / CRITICAL CARE MEDICINE   Name: Paula Fletcher MRN: [id number] DOB: [birth year]    ADMISSION DATE:  11/28/2014 CONSULTATION DATE:  11/24/2014  REFERRING MD :  [facility]  CHIEF COMPLAINT:  Nose bleed, generalized weakness, SOB  INITIAL PRESENTATION:   Paula Fletcher is a 80 y.o. F with PMH of 4L O2 dependent COPD and A.fib on chronic coumadin, She presented to [facility] on 01/05 for epistaxis, generalized weakness, and SOB.  Epistaxis began roughly one day prior at 1230 and resolved spontaneously prior to re-starting again shortly thereafter.  She reports that bleeding was "like a stream", primarily out of the right nare.  Also believes that some blood went down her throat. In ED, she was found to have temp of 94 rectally, supratherapeutic INR at 7.64, anemia with Hgb 10, acute renal failure with SCr 3.90, hyperkalemia with K of 7.  In addition, she was started on BiPAP which was later changed to [facility].  Request was made to transfer to [facility] ICU for concerns that pt may deteriorate.  PCCM consulted for admission.  On arrival to [facility], pt noted to still be slowly bleeding from b/l nares as well as new bleeding from foley site.  STUDIES:  01/05 CT Head: no acute abnormality, opacification of right nasal cavity and nasopharynx, may be related to epistaxis. 01/05 - admitted to [facility] ICU 11/14/14: Still with intermittent very mild slow epistaxis. Hungry . Making urine.  ENT consult - Conservative Rx. Repeat FFP 11/15/14 - progressive hypoxemic resp faiure despite lasix ,INTUBATED +. CENTRAL LINE + 1/10: requiring higher FIO2/Peep/ renal fxn improved. Off pressors.  1/11 higher need for O2 today with increased PEEP.  Very agitated this AM with high RR in the high 30s. 1/12 serum Cr deteriorating and UOP dropping with drop in BP.  SUBJECTIVE/OVERNIGHT/INTERVAL HX Tolerating volume negative well, much more alert and interactive.  VITAL SIGNS: Temp:  [97.3 F (36.3 C)-98.1 F (36.7 C)] 97.5 F (36.4  C) (01/16 0800) Pulse Rate:  [45-109] 89 (01/16 0800) Resp:  [17-34] 27 (01/16 0800) BP: (80-162)/(29-133) 113/84 mmHg (01/16 0800) SpO2:  [57 %-100 %] 99 % (01/16 0800) Arterial Line BP: (49-81)/(27-63) 49/27 mmHg (01/15 1900) FiO2 (%):  [40 %-60 %] 60 % (01/16 0350) Weight:  [115.5 kg (254 lb 10.1 oz)] 115.5 kg (254 lb 10.1 oz) (01/16 0500)   HEMODYNAMICS: CVP:  [10 mmHg-32 mmHg] 23 mmHg  VENTILATOR SETTINGS: Vent Mode:  [-] PCV FiO2 (%):  [40 %-60 %] 60 % Set Rate:  [20 bmp] 20 bmp PEEP:  [8 cmH20-15 cmH20] 10 cmH20 Plateau Pressure:  [29 cmH20-39 cmH20] 39 cmH20  INTAKE / OUTPUT: Intake/Output      01/15 0701 - 01/16 0700 01/16 0701 - 01/17 0700   I.V. (mL/kg) 898 (7.8) 39 (0.3)   Other 20    NG/GT 830 30   IV Piggyback  260   Total Intake(mL/kg) 1748 (15.1) 329 (2.8)   Urine (mL/kg/hr) 26 (0)    Other 3786 (1.4) 195 (1.2)   Stool 140 (0.1)    Total Output 3952 195   Net -2204.1 +134        Stool Occurrence 1 x     PHYSICAL EXAMINATION: General: Much more alert and interactive. Neuro: Awake. Moving all ext to command. HEENT: ET tube +. No active bleeding oropharynx. Cardiovascular: RRR, no M/R/G.  Lungs: scattered rhonchi, rales  Abdomen: BS x 4, soft, NT/ND.  Ext: Chronic stasis changes, trace b/l edema, multiple superficial ulcers  on LLE. In ACE wrap  Skin: Intact, warm, no rashes.  LABS: PULMONARY  Recent Labs Lab 11/20/14 1103 11/21/14 0337 11/22/14 0337 11/23/14 0500 11/24/14 0500  PHART 7.311* 7.379 7.415 7.382 7.459*  PCO2ART 37.8 37.9 38.6 41.1 35.3  PO2ART 66.0* 61.0* 100.0 52.8* 80.6  HCO3 19.0* 21.9 24.2* 24.1* 24.7*  TCO2 20 23.1 25.3 25.4 25.8  O2SAT 91.0 90.1 97.4 90.0 96.2   CBC  Recent Labs Lab 11/22/14 0540 11/23/14 0600 11/24/14 0500  HGB 8.1* 8.4* 8.3*  HCT 25.8* 25.9* 25.7*  WBC 13.3* 15.5* 13.6*  PLT 211 180 224   COAGULATION No results for input(s): INR in the last 168 hours. CARDIAC No results for input(s):  TROPONINI in the last 168 hours. No results for input(s): PROBNP in the last 168 hours.  CHEMISTRY  Recent Labs Lab 11/20/14 0540 11/21/14 0428 11/22/14 0540 11/23/14 0600 11/24/14 0500  NA 142 135 137 134* 137  K 3.9 3.6 3.9 3.9 3.8  CL 110 100 102 102 102  CO2 GLUCOSE 163* 153* 134* 146* 149*  BUN 162* 99* 60* 53* 50*  CREATININE 2.96* 2.01* 1.45* 1.33* 1.25*  CALCIUM 8.3* 8.2* 8.3* 7.9* 8.1*  MG 2.0 2.2 2.2 2.4 2.4  PHOS 5.1* 3.5 2.4 2.3 1.7*    Estimated Creatinine Clearance: 48.7 mL/min (by C-G formula based on Cr of 1.25).     LIVER  Recent Labs Lab 11/18/14 0530 11/19/14 0545  AST 32 33  ALT 23 26  ALKPHOS 50 51  BILITOT 0.5 0.8  PROT 5.7* 5.8*  ALBUMIN 2.5* 2.3*   INFECTIOUS  Recent Labs Lab 11/18/14 0530  PROCALCITON 0.46   ENDOCRINE CBG (last 3)   Recent Labs  11/23/14 2354 11/24/14 0359 11/24/14 0806  GLUCAP 147* 148* 149*   IMAGING x48h Dg Chest Port 1 View  11/23/2014   CLINICAL DATA:  Followup endotracheal tube  EXAM: PORTABLE CHEST - 1 VIEW  COMPARISON:  11/22/2013  FINDINGS: Cardiomediastinal silhouette is stable. Endotracheal tube in place with tip 3.6 cm above the carina. NG tube is unchanged in position. Stable left IJ central line position. Persistent central vascular congestion and mild interstitial prominence probable mild edema with slight improvement in aeration. Small bilateral basilar atelectasis or infiltrate.  IMPRESSION: Stable support apparatus. Persistent central vascular congestion and mild interstitial prominence with slight improvement in aeration. Small bilateral basilar atelectasis or infiltrate.   Electronically Signed   By: Natasha Mead M.D.   On: 11/23/2014 08:06   ASSESSMENT / PLAN:  PULMONARY A: Acute on Chronic Hypoxic Respiratory failure: favor aspiration w/ ALI vs TRALI +/- element of edema: PCT neg w/ reassuring sputum off abx  Moderate COPD Pulmonary nodule - 7mm RLL seen on chest CT Feb  2015. P:   Full vent support - decrease PEEP to 5 and titrate FiO2 down as able. Volume negative as below via CRRT. DuoNebs scheduled. CXR in AM. See ID section.  CARDIOVASCULAR A:  AF w/ RVR  Risk of hyperkalemia induced arrhythmias Chronic diastolic heart failure - on opd  Hx HTN, HLD OPD meds:  lasix, losartan, lopressor, nifedipine, rosuvastatin for now. >propofol induced hypotension. P:  Amiodarone changed to PO and D/C IV. No anticoagulation. Neo for BP support inorder to continue volume negative. Negative Volume via CRRT.  RENAL A:   AKI, nonoliguric-->improving  Hypernatremia  Chronic edema Volume overload P:   Renal consult appreciated.  No more ARB. Monitor BMET intermittently. Replace electrolytes as  neded. CVVH out to 100 ml/hr and can adjust neo as needed, anticipate will need approximately 4-6 more liters negative. Monitor I/Os. D/Ced free water. Saline lock.  GASTROINTESTINAL A:   NO acute issues P:   SUP: IV PPI Cont tube feeds   HEMATOLOGIC A:   Warfarin induced coagulopathy - with septra interaction prior to admit-->INR better  Acute blood loss anemia-->hgb stable  Epistaxis VTE Prophylaxis  - s/p FFP complete 11/15/2014 and 11/14/14  P:  DVT px: SCD Monitor CBC intermittently Transfuse RBCs for Hgb < 7.0 or hemorrhagic shock - not indicated presently. A very poor candidate for long term warfarin  INFECTIOUS A:   Chronic venous stasis ulcers LLE Pulmonary infiltrates - worrisome for aspiration PNA  P:   Blood Cx 01/05 >>> NEG Sputum Cx 01/05: few wbc>>> Follow off abx for now.  ENDOCRINE A:   Hyperglycemia without documented hx of DM P:   Consider SSI if glucose > 180.  NEUROLOGIC A:   Chronic pain   - on opd gabapentin and norco 11/16/14 on fent gtt Precedex was completely ineffective in this patient even with high doses. P:   RASS goal 0 to -2 Precedex completely ineffective and will not be restarted again. Minimize  sedation to pushes given sensitivity to narcotics.  DERM/MSK A: Chronic LLE distal venous stasis ulcer 2x 2cm . [facility]so[facility]  - per wound care  Family updated:  No family bedside 1/15.  GLOBAL Decrease PEEP to 5, hold weaning, change amio to PO, will likely be able to start PS trials in AM.  The patient is critically ill with multiple organ systems failure and requires high complexity decision making for assessment and support, frequent evaluation and titration of therapies, application of advanced monitoring technologies and extensive interpretation of multiple databases.   Critical Care Time devoted to patient care services described in this note is  35  Minutes. This time reflects time of care of this signee Dr Koren BoundWesam Benecio Kluger. This critical care time does not reflect procedure time, or teaching time or supervisory time of PA/NP/Med student/Med Resident etc but could involve care discussion time.  Alyson ReedyWesam G. Jyoti Harju, M.D. [facility] Pulmonary/Critical Care Medicine. Pager: [phone]. After hours pager: [phone].

## 2014-11-24 NOTE — Progress Notes (Signed)
[facility] ROUNDING NOTE   Subjective:   Interval History: some hypotension and patient requiring levophed  Attempting to wean  Objective:  Vital signs in last 24 hours:  Temp:  [97.3 F (36.3 C)-98.1 F (36.7 C)] 97.5 F (36.4 C) (01/16 0800) Pulse Rate:  [60-109] 93 (01/16 1000) Resp:  [17-40] 27 (01/16 1000) BP: (80-162)/(29-133) 125/71 mmHg (01/16 1000) SpO2:  [57 %-100 %] 91 % (01/16 1000) Arterial Line BP: (49-81)/(27-57) 49/27 mmHg (01/15 1900) FiO2 (%):  [40 %-60 %] 40 % (01/16 0912) Weight:  [115.5 kg (254 lb 10.1 oz)] 115.5 kg (254 lb 10.1 oz) (01/16 0500)  Weight change: 0 kg (0 lb) Filed Weights   11/22/14 0500 11/23/14 0600 11/24/14 0500  Weight: 119.4 kg (263 lb 3.7 oz) 115.5 kg (254 lb 10.1 oz) 115.5 kg (254 lb 10.1 oz)    Intake/Output: I/O last 3 completed shifts: In: 2673.7 [I.V.:1368.7; Other:115; NG/GT:1190] Out: 6004 [Urine:52; ZOXWR:6045; Stool:140]   Intake/Output this shift:  Total I/O In: 279.5 [I.V.:103.5; NG/GT:90; IV Piggyback:86] Out: 856 [Other:856]  CVS- RRR RS- CTA ABD- BS present soft non-distended EXT- no edema   Basic Metabolic Panel:  Recent Labs Lab 11/20/14 0540 11/21/14 0428 11/22/14 0540 11/23/14 0600 11/24/14 0500  NA 142 135 137 134* 137  K 3.9 3.6 3.9 3.9 3.8  CL 110 100 102 102 102  CO2 GLUCOSE 163* 153* 134* 146* 149*  BUN 162* 99* 60* 53* 50*  CREATININE 2.96* 2.01* 1.45* 1.33* 1.25*  CALCIUM 8.3* 8.2* 8.3* 7.9* 8.1*  MG 2.0 2.2 2.2 2.4 2.4  PHOS 5.1* 3.5 2.4 2.3 1.7*    Liver Function Tests:  Recent Labs Lab 11/18/14 0530 11/19/14 0545  AST 32 33  ALT 23 26  ALKPHOS 50 51  BILITOT 0.5 0.8  PROT 5.7* 5.8*  ALBUMIN 2.5* 2.3*   No results for input(s): LIPASE, AMYLASE in the last 168 hours. No results for input(s): AMMONIA in the last 168 hours.  CBC:  Recent Labs Lab 11/18/14 0530  11/20/14 0540 11/21/14 0428 11/22/14 0540 11/23/14 0600 11/24/14 0500   WBC 8.6  < > 8.0 11.4* 13.3* 15.5* 13.6*  NEUTROABS 7.3  --   --   --   --   --   --   HGB 7.8*  < > 8.0* 8.3* 8.1* 8.4* 8.3*  HCT 25.2*  < > 25.5* 26.2* 25.8* 25.9* 25.7*  MCV 100.0  < > 104.5* 99.2 101.6* 98.9 100.0  PLT 147*  < > 165 183 211 180 224  < > = values in this interval not displayed.  Cardiac Enzymes: No results for input(s): CKTOTAL, CKMB, CKMBINDEX, TROPONINI in the last 168 hours.  BNP: Invalid input(s): POCBNP  CBG:  Recent Labs Lab 11/23/14 1623 11/23/14 2003 11/23/14 2354 11/24/14 0359 11/24/14 0806  GLUCAP 120* 166* 147* 148* 149*    Microbiology: Results for orders placed or performed during the hospital encounter of 11/16/2014  Culture, blood (single)     Status: None   Collection Time: 12/07/2014 11:13 AM  Result Value Ref Range Status   Specimen Description BLOOD RIGHT ANTECUBITAL  Final   Special Requests BOTTLES DRAWN AEROBIC AND ANAEROBIC [facility]  Final   Culture NO GROWTH 6 DAYS  Final   Report Status 11/19/2014 FINAL  Final  Culture, blood (single)     Status: None   Collection Time: 11/12/2014  1:10 PM  Result Value Ref Range Status   Specimen Description  BLOOD RIGHT ANTECUBITAL  Final   Special Requests BOTTLES DRAWN AEROBIC ONLY 4CC  IMMUNE:COMPROMISED  Final   Culture NO GROWTH 6 DAYS  Final   Report Status 11/19/2014 FINAL  Final  MRSA PCR Screening     Status: None   Collection Time: 11/15/14  6:26 AM  Result Value Ref Range Status   MRSA by PCR NEGATIVE NEGATIVE Final    Comment:        The GeneXpert MRSA Assay (FDA approved for NASAL specimens only), is one component of a comprehensive MRSA colonization surveillance program. It is not intended to diagnose MRSA infection nor to guide or monitor treatment for MRSA infections.   Culture, respiratory (NON-Expectorated)     Status: None   Collection Time: 11/16/14  3:30 PM  Result Value Ref Range Status   Specimen Description TRACHEAL ASPIRATE  Final   Special Requests Normal   Final   Gram Stain   Final    FEW WBC PRESENT,BOTH PMN AND MONONUCLEAR RARE SQUAMOUS EPITHELIAL CELLS PRESENT RARE YEAST Performed at [facility]    Culture   Final    FEW YEAST CONSISTENT WITH CANDIDA SPECIES Performed at [facility]    Report Status 11/18/2014 FINAL  Final  Clostridium Difficile by PCR     Status: None   Collection Time: 11/23/14  5:50 AM  Result Value Ref Range Status   C difficile by pcr NEGATIVE NEGATIVE Final    Coagulation Studies: No results for input(s): LABPROT, INR in the last 72 hours.  Urinalysis: No results for input(s): COLORURINE, LABSPEC, PHURINE, GLUCOSEU, HGBUR, BILIRUBINUR, KETONESUR, PROTEINUR, UROBILINOGEN, NITRITE, LEUKOCYTESUR in the last 72 hours.  Invalid input(s): APPERANCEUR    Imaging: Dg Chest Port 1 View  11/24/2014   CLINICAL DATA:  Intubated patient.  Shortness of breath.  EXAM: PORTABLE CHEST - 1 VIEW  COMPARISON:  11/23/2014  FINDINGS: Again noted are diffuse interstitial lung densities which have minimally changed. Heart size is stable and within normal limits. Endotracheal tube remains well positioned above the carina. The left jugular central line tip is in the upper SVC region. Atherosclerotic calcifications at the aortic arch. Stable irregularity and lucency involving the left humeral head. There may be volume loss or consolidation in the retrocardiac space.  IMPRESSION: Diffuse interstitial lung densities have minimally changed. Findings may represent interstitial pulmonary edema. Increased densities in the retrocardiac space.  Support apparatuses as described.   Electronically Signed   By: Richarda OverlieAdam  Henn M.D.   On: 11/24/2014 08:25   Dg Chest Port 1 View  11/23/2014   CLINICAL DATA:  Followup endotracheal tube  EXAM: PORTABLE CHEST - 1 VIEW  COMPARISON:  11/22/2013  FINDINGS: Cardiomediastinal silhouette is stable. Endotracheal tube in place with tip 3.6 cm above the carina. NG tube is unchanged in position.  Stable left IJ central line position. Persistent central vascular congestion and mild interstitial prominence probable mild edema with slight improvement in aeration. Small bilateral basilar atelectasis or infiltrate.  IMPRESSION: Stable support apparatus. Persistent central vascular congestion and mild interstitial prominence with slight improvement in aeration. Small bilateral basilar atelectasis or infiltrate.   Electronically Signed   By: Natasha MeadLiviu  Pop M.D.   On: 11/23/2014 08:06     Medications:   . heparin 10,000 units/ 20 mL infusion syringe 2,300 Units/hr (11/24/14 1100)  . phenylephrine (NEO-SYNEPHRINE) Adult infusion 45 mcg/min (11/24/14 1000)  . dialysis replacement fluid (prismasate) 200 mL/hr at 11/24/14 0044  . dialysis replacement fluid (prismasate) 300 mL/hr  at 11/22/14 1818  . dialysate (PRISMASATE) 2,000 mL/hr at 11/24/14 0622   . amiodarone  200 mg Oral Daily  . antiseptic oral rinse  7 mL Mouth Rinse QID  . bacitracin   Topical BID  . chlorhexidine  15 mL Mouth Rinse BID  . feeding supplement (PRO-STAT SUGAR FREE 64)  60 mL Per Tube TID  . feeding supplement (VITAL HIGH PROTEIN)  1,000 mL Per Tube Q24H  . ipratropium-albuterol  3 mL Nebulization Q6H  . pantoprazole (PROTONIX) IV  40 mg Intravenous Q24H  . sodium phosphate  Dextrose 5% IVPB  30 mmol Intravenous Once   acetaminophen (TYLENOL) oral liquid 160 mg/5 mL, fentaNYL, heparin, heparin, loperamide, midazolam, sodium chloride, sodium chloride  Assessment/ Plan:  Paula Fletcher is a 80 y.o. F with PMH of 4L O2 dependent COPD and A.fib on chronic coumadin, She presented to [facility] on 01/05 for epistaxis, generalized weakness, and SOB. Epistaxis began roughly one day prior at 1230 and resolved spontaneously prior to re-starting again shortly thereafter. She reports that bleeding was "like a stream", primarily out of the right nare. Also believes that some blood went down her throat. In ED, she was found to have temp of 94  rectally, supratherapeutic INR at 7.64, anemia with Hgb 10, acute renal failure with SCr 3.90, hyperkalemia with K of 7. In addition, she was started on BiPAP which was later changed to [facility].    Acute renal failure Baseline chronic renal failure with a baseline creatinine of 1.8 . The BUN of 160 would be likely to be due to blood ingested from nosebleed and a volume contracted state.   Hypotension most likely hypovolemia in the setting of bleeding   Anemia Epistaxis Patient could receive blood products Also had a coagulopathy from warfarin exposure  Hypophosphatemia  replace   Patient becoming more hypotensive with fluid removal.    LOS: 11 Paula Fletcher  :18 AM

## 2014-11-24 NOTE — Progress Notes (Signed)
eLink Physician-Brief Progress Note Patient Name: Paula MartesCarolyn M Drakes DOB: 05-25-1935 MRN: [id number]   Date of Service  11/24/2014  HPI/Events of Note  Pt markedly worse. More hypoxemia and hypotension  eICU Interventions  chk ABG, pCXR, add levophed, max neo, ask renal to make even on CVVH  POOR prognosis     Intervention Category Major Interventions: Shock - evaluation and management;Respiratory failure - evaluation and management  Shan Levansatrick Anabelen Kaminsky 11/24/2014, 9:31 PM

## 2014-11-24 NOTE — Progress Notes (Signed)
eLink Physician-Brief Progress Note Patient Name: Paula MartesCarolyn M Fletcher DOB: 12/23/1934 MRN: [id number]   Date of Service  11/24/2014  HPI/Events of Note  Hypophosphatemia  eICU Interventions  Phos replaced     Intervention Category Intermediate Interventions: Electrolyte abnormality - evaluation and management  DETERDING,ELIZABETH 11/24/2014, 6:36 AM

## 2014-11-25 ENCOUNTER — Inpatient Hospital Stay (HOSPITAL_COMMUNITY): Payer: Medicare FFS

## 2014-11-25 DIAGNOSIS — J80 Acute respiratory distress syndrome: Secondary | ICD-10-CM

## 2014-11-25 DIAGNOSIS — I469 Cardiac arrest, cause unspecified: Secondary | ICD-10-CM

## 2014-11-25 LAB — POCT I-STAT 3, ART BLOOD GAS (G3+)
ACID-BASE DEFICIT: 7 mmol/L — AB (ref 0.0–2.0)
Acid-base deficit: 3 mmol/L — ABNORMAL HIGH (ref 0.0–2.0)
Bicarbonate: 23.5 mEq/L (ref 20.0–24.0)
Bicarbonate: 24 mEq/L (ref 20.0–24.0)
O2 SAT: 80 %
O2 SAT: 73 %
PH ART: 7.068 — AB (ref 7.350–7.450)
PO2 ART: 42 mmHg — AB (ref 80.0–100.0)
Patient temperature: 97.4
TCO2: 25 mmol/L (ref 0–100)
TCO2: 27 mmol/L (ref 0–100)
pCO2 arterial: 45.1 mmHg — ABNORMAL HIGH (ref 35.0–45.0)
pCO2 arterial: 82.5 mmHg (ref 35.0–45.0)
pH, Arterial: 7.314 — ABNORMAL LOW (ref 7.350–7.450)
pO2, Arterial: 53 mmHg — ABNORMAL LOW (ref 80.0–100.0)

## 2014-11-25 LAB — BASIC METABOLIC PANEL
Anion gap: 9 (ref 5–15)
BUN: 38 mg/dL — ABNORMAL HIGH (ref 6–23)
CALCIUM: 8.2 mg/dL — AB (ref 8.4–10.5)
CO2: 26 mmol/L (ref 19–32)
Chloride: 100 mEq/L (ref 96–112)
Creatinine, Ser: 1.14 mg/dL — ABNORMAL HIGH (ref 0.50–1.10)
GFR calc Af Amer: 52 mL/min — ABNORMAL LOW (ref >90)
GFR calc non Af Amer: 45 mL/min — ABNORMAL LOW (ref >90)
GLUCOSE: 165 mg/dL — AB (ref 70–99)
Potassium: 4.5 mmol/L (ref 3.5–5.1)
SODIUM: 135 mmol/L (ref 135–145)

## 2014-11-25 LAB — CBC
HCT: 27.3 % — ABNORMAL LOW (ref 36.0–46.0)
Hemoglobin: 8.4 g/dL — ABNORMAL LOW (ref 12.0–15.0)
MCH: 31.5 pg (ref 26.0–34.0)
MCHC: 30.8 g/dL (ref 30.0–36.0)
MCV: 102.2 fL — ABNORMAL HIGH (ref 78.0–100.0)
PLATELETS: 251 10*3/uL (ref 150–400)
RBC: 2.67 MIL/uL — ABNORMAL LOW (ref 3.87–5.11)
RDW: 18.4 % — ABNORMAL HIGH (ref 11.5–15.5)
WBC: 19.3 10*3/uL — AB (ref 4.0–10.5)

## 2014-11-25 LAB — POCT ACTIVATED CLOTTING TIME
Activated Clotting Time: 202 seconds
Activated Clotting Time: 214 seconds
Activated Clotting Time: 196 seconds

## 2014-11-25 LAB — PHOSPHORUS: PHOSPHORUS: 4 mg/dL (ref 2.3–4.6)

## 2014-11-25 LAB — APTT
aPTT: >200 seconds (ref 24–37)
aPTT: >200 seconds (ref 24–37)

## 2014-11-25 LAB — MAGNESIUM: Magnesium: 2.7 mg/dL — ABNORMAL HIGH (ref 1.5–2.5)

## 2014-11-25 MED ORDER — EPINEPHRINE HCL 0.1 MG/ML IJ SOSY
1.0000 mg | PREFILLED_SYRINGE | Freq: Once | INTRAMUSCULAR | Status: AC
  Administered 2014-11-25: 1 mg via INTRAVENOUS

## 2014-11-25 MED ORDER — ATROPINE SULFATE 0.1 MG/ML IJ SOLN
INTRAMUSCULAR | Status: AC
  Administered 2014-11-25: 1 mg
  Filled 2014-11-25: qty 10

## 2014-11-25 MED ORDER — SODIUM BICARBONATE 8.4 % IV SOLN
50.0000 meq | Freq: Once | INTRAVENOUS | Status: AC
  Administered 2014-11-25: 50 meq via INTRAVENOUS

## 2014-11-25 MED ORDER — DOPAMINE-DEXTROSE 3.2-5 MG/ML-% IV SOLN
0.0000 ug/kg/min | INTRAVENOUS | Status: DC
  Administered 2014-11-25: 10 ug/kg/min via INTRAVENOUS

## 2014-11-26 MED FILL — Medication: Qty: 1 | Status: AC

## 2014-11-27 LAB — GLUCOSE, CAPILLARY
GLUCOSE-CAPILLARY: 153 mg/dL — AB (ref 70–99)
Glucose-Capillary: 159 mg/dL — ABNORMAL HIGH (ref 70–99)
Glucose-Capillary: 126 mg/dL — ABNORMAL HIGH (ref 70–99)

## 2014-12-10 NOTE — Procedures (Signed)
Bronchoscopy Procedure Note [place] MartesCarolyn M Calef [id number] August 01, 1935  Procedure: Bronchoscopy Indications: Diagnostic evaluation of the airways and Remove secretions  Procedure Details Consent: Unable to obtain consent because of emergent medical necessity. Time Out: Verified patient identification, verified procedure, site/side was marked, verified correct patient position, special equipment/implants available, medications/allergies/relevent history reviewed, required imaging and test results available.  Performed  In preparation for procedure, patient was given 100% FiO2. Sedation: None  Airway entered and the following bronchi were examined: RUL, RML, RLL, LUL, LLL and Bronchi.   Large amounts of blood sputum very thick secretion blocking the entire left main, 20 minutes to open. Bronchoscope removed.    Evaluation Hemodynamic Status: Bradycardia and hypotension, please see code sheet; O2 sats: transiently fell during during procedure Patient's Current Condition: unstable Specimens:  None Complications: No apparent complications  Klever Twyford 12/02/2014

## 2014-12-10 NOTE — Progress Notes (Signed)
CRITICAL VALUE ALERT  Critical value received:  APTT of >200 Date of notification:  11/21/2014  Time of notification:  0500 Critical value read back: Yes  Nurse who received alert:  Lily Peerhomas Indio Santilli  MD notified (1st page): Shelba FlakeElizabeth Deterding  Time of first page: 0600  Responding MD: Shelba FlakeElizabeth Deterding   Time MD responded:  0600

## 2014-12-10 NOTE — Procedures (Signed)
Intubation Procedure Note Dayton MartesCarolyn M Hearld [id number] 01-Oct-1935  Procedure: Intubation Indications: ETT needed to be changed  Procedure Details Consent: Unable to obtain consent because of emergent medical necessity. Time Out: Verified patient identification, verified procedure, site/side was marked, verified correct patient position, special equipment/implants available, medications/allergies/relevent history reviewed, required imaging and test results available.  Performed  Maximum sterile technique was used including gloves, gown, hand hygiene and mask.  MAC    Evaluation Hemodynamic Status: BP stable throughout; O2 sats: stable throughout Patient's Current Condition: stable Complications: No apparent complications Patient did tolerate procedure well. Chest X-ray ordered to verify placement.  CXR: tube position acceptable by bronchoscopy.   Koren BoundYACOUB,Izayiah Tibbitts 2015-05-13

## 2014-12-10 NOTE — Procedures (Signed)
ACLS Note  Patient LCB with drugs only.  Developed PEA.  ACLS protocol followed, please see code sheet, but no CPR was offered.  Alyson ReedyWesam G. Bernice Mullin, M.D. [facility] Pulmonary/Critical Care Medicine. Pager: [phone]. After hours pager: [phone].

## 2014-12-10 NOTE — Progress Notes (Signed)
During morning assessment- pt noted to have eyes open but unresponsive and NT at bedside to check pt's temperature. NT assisting RN to reposition patient in bed and wake her up. As we lowered the bed, the CRRT machine began to alarm "negative pressure". Attempted trouble shooting machine and pt's HR was noted in 30s. Attempted to arouse pt. Pt unarousable. MD notified. Family was notified of need to come in. Unit nurses assisted getting atropine from pyxis to have ready. Dr. Molli KnockYacoub entered unit as he was being notified by nurse on our unit. He could not find pulse or hear any lung sounds on left side. Requested STAT chest xray. RT at bedside. Bicarb, atropine, epi pushed. Pulse returned. MD used scope to see what was causing the absent breath sounds on L. Md found blockage and was able to suction out some of the blockage. Attempted to suction more of the blockage and pt's sats dropped to 50s. Allowed pt's sats to stabilize and resumed suction. Was able to suction more out. Opted to exchange ETT for larger size. Pt projectile vomited profusely. MD was able to exchange ETT. Pt's HR dropped and no pulse was found.  Another round of meds given- Bicarb, atropine, epi. No return of pulse. Pronounced dead at 0903. Family notified on unit by MD. Candelaria Stagershaplin notified.

## 2014-12-10 NOTE — Progress Notes (Signed)
Chaplain responded to provide grief support following the death of patient.  Provided prayer and emotional support.  Offered drinks.  Note: patient's niece, Gunnar Fusiaula, works at [employer] [address] as a Engineer, civil (consulting)nurse.  Also, complicating family's grief is the news that patient's nephew, cousin to Gunnar Fusiaula, was recently declared missing in [place] and feared dead.  There have been ongoing searches.    Theodoro Parmaalacios, Jaquavious Mercer N, Chaplain [phone]    2015/05/22 1000  Clinical Encounter Type  Visited With Family  Visit Type Death  Referral From Nurse  Spiritual Encounters  Spiritual Needs Grief support  Stress Factors  Family Stress Factors Major life changes

## 2014-12-10 NOTE — Progress Notes (Signed)
Called bedside by RN, patient HR was in the 30's.  No BS were audible on the left.  SBP was in the 50s.  Pulse was present but faint.  Patient was given an amp of bicarb, epi and atropine.  ABG revealed a pH of 7.0 with CO2 of 72.  Vitals seemed to stabilize.  Bronchoscope was brought up, blood and sputum mix blocking the entire left main.  Removed large amount of the obstruction.  During the process the patient vomited a large amount of bilious material.  Stomach and lungs both suctioned.  Further clots were being removed and O2 sat was improving.  Was able to get clots to the ETT but was too narrow to remove effectively.  Patient began to become unstable again.  Peak pressures were elevated on the ventilator.  HR started dropping and we were unable to bag effectively.  Another bout of epi/atropine/bicarb were given.  Bronchoscope reintroduced and the ETT was essentially blocked with tenacious bloody secretions.  Patient progressed to PEA.  Decision was made to change ETT to a larger newer tube inorder to be able to ventilate.  ACLS protocol followed without CPR (this was the decision of the family prior).  ETT was changed to an 8.  ETT verified in the airway bronchoscopically.  Bagging remained extremely difficult.  I suspect significant air trapping due to COPD.  Allowed to exhale for 5 seconds then bagged again.  Patient remained PEA throughout the process and did not respond to epi/atropine.  Given duration without CPR or a heart beat decision was made at that point to stop resuscitative efforts.  Patient was declared dead.  Family arrived bedside and was notified.  Condolences were given.  ME was called given events prior to coming to the hospital.  The patient is critically ill with multiple organ systems failure and requires high complexity decision making for assessment and support, frequent evaluation and titration of therapies, application of advanced monitoring technologies and extensive interpretation  of multiple databases.   Critical Care Time devoted to patient care services described in this note is  35  Minutes. This time reflects time of care of this signee Dr Koren BoundWesam Yacoub. This critical care time does not reflect procedure time, or teaching time or supervisory time of PA/NP/Med student/Med Resident etc but could involve care discussion time.  Alyson ReedyWesam G. Yacoub, M.D. [facility]. Pager: [phone]. After hours pager: [phone].

## 2014-12-10 DEATH — deceased

## 2014-12-18 NOTE — Discharge Summary (Addendum)
NAME:  Paula Fletcher, [name]               ACCOUNT NO.:  [id number]  MEDICAL RECORD NO.:  [id number]  LOCATION:  [id number]                        FACILITY:  [facility]  PHYSICIAN:  Felipa EvenerWesam Jake Yacoub, MD  DATE OF BIRTH:  05-11-1935  DATE OF ADMISSION:  07/05/15 DATE OF DISCHARGE:  11/27/2014                              DISCHARGE SUMMARY   PRIMARY DIAGNOSES/CAUSE OF DEATH:  Pulseless activity, cardiac arrest.  SECONDARY DIAGNOSIS:  Chronic obstructive pulmonary disease, acute nosebleed, hemoptysis, acute and chronic hypercarbic respiratory failure, atrial fibrillation versus left ventricular response, systemic hypertension, acute kidney injuries, oliguria, hypernatremia, warfarin induced coagulopathy, chronic kidney disease stage 3, epistaxis.  Patient is a 80 year old female with past medical significant for atrial fibrillation, chronic warfarin who presented to the hospital with acute epistaxis.  Patient aspirated significant amount of blood and developed acute on chronic respiratory failure. The patient was transferred to intensive care unit in [facility], at which point, she was [facility].  In [facility], discussion with the family indicates the patient would not want CPR or electrical shocks or would want short-term intubation.  Aspiration pneumonia unlikely and antibiotics were discontinued. On the day of the patient's expiration, bedside MD was called by the RN bedside due to hypoxia and bradycardia.  No breath sounds were audible on the left. Bronchoscopy was done.  Significant amount of secretions were noted. Bradycardia continues to worsen.  Patient developed pulseless electric activity, cardiac arrest.  Epi and atropine were given.  ACLS protocol except for chest compression were followed per family's wishes. However, patient did not have spontaneous circulation at which point the decision was made to stop resuscitative efforts.  Patient was  declared dead and family was notified.  And condolences were given.     Felipa EvenerWesam Jake Yacoub, MD     WJY/MEDQ  D:  12/17/2014  T:  12/18/2014  Job:  [id number]

## 2015-05-15 IMAGING — CT CT ANGIO CHEST
1 of 6 series · 5 of 36 positions shown · IV contrast (Omnipaque 300)
Comparison: DG CHEST PORT [id number] DAY dated 12/11/2013

CLINICAL DATA: Hypoxia, acute respiratory failure, history of COPD

EXAM:
CT ANGIOGRAPHY CHEST WITH CONTRAST
TECHNIQUE: Multidetector CT imaging of the chest was performed using the
standard protocol during bolus administration of intravenous
contrast. Multiplanar CT image reconstructions including MIPs were
obtained to evaluate the vascular anatomy.
CONTRAST:  80mL OMNIPAQUE IOHEXOL 350 MG/ML SOLN

[Series 4: pe 3.0 b40f · axial · 0.71mm/px · z∈[-202,-64]mm · 5 of 70 slices shown]
[im 12/70  lung]
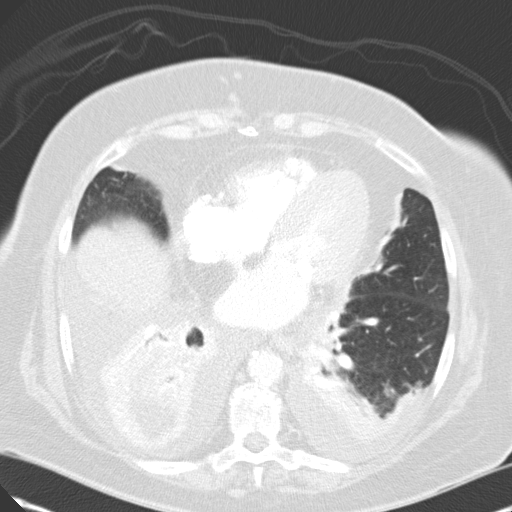
[im 24/70  mediastinal]
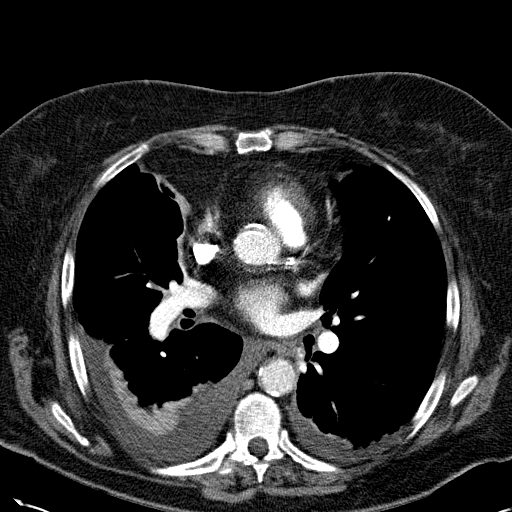
[im 35/70  lung]
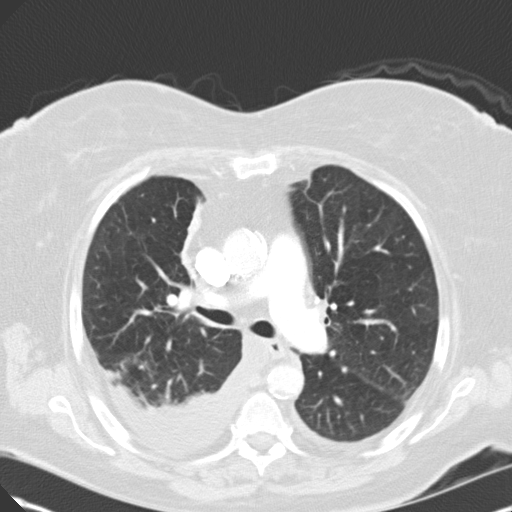
[im 47/70  mediastinal]
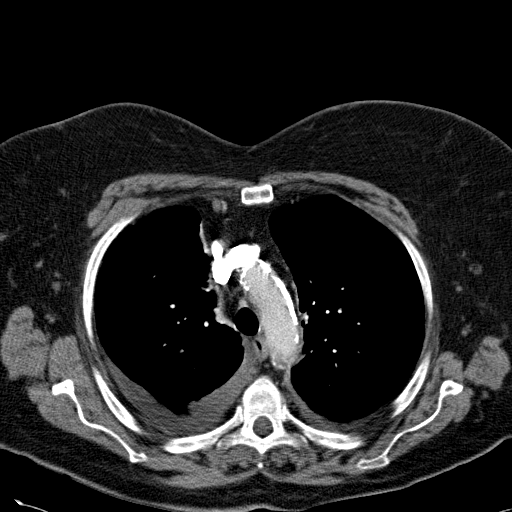
[im 58/70  lung]
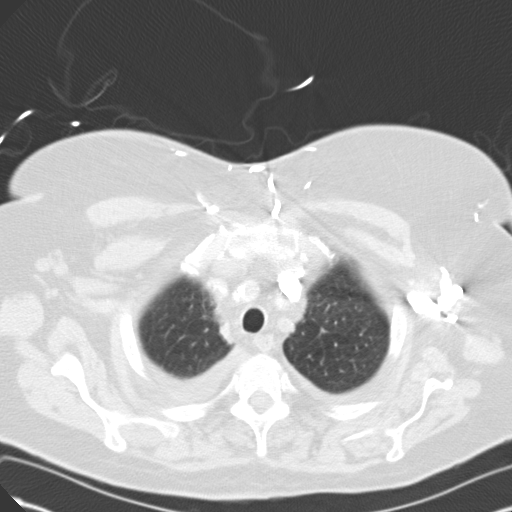

[5 of 36 positions shown; findings below may reference images not displayed]

FINDINGS: Contrast within the pulmonary arterial tree is normal in appearance.
There are no filling defects to suggest acute pulmonary embolism.
There is a moderate-sized right pleural effusion and smaller left
pleural effusion. There is dense infiltrate within both lower lobes
posteriorly. The upper lobes are spared. The cardiac chambers are
top-normal in size. There is no pericardial effusion. The caliber of
the thoracic aorta is normal. No bulky mediastinal or hilar lymph
nodes are evident.

The observed portions of the thoracic spine exhibit no acute
abnormalities. There is degenerative disc change inferiorly. The
sternum appears intact. Within the upper abdomen the observed
portions of the liver and spleen appear normal.

Review of the MIP images confirms the above findings.
IMPRESSION: 1. There is no evidence of an acute pulmonary embolism or acute
thoracic aortic pathology.
2. Bibasilar pneumonia is present with a moderate-sized right
pleural effusion and smaller left pleural effusion.
3. There is no evidence of CHF. No bulky mediastinal or hilar lymph
nodes are evident.

## 2016-04-17 IMAGING — CR DG CHEST 1V PORT
1 series · 1 of 1 positions shown · non-contrast
Comparison: 11/14/2014

CLINICAL DATA: Acute respiratory failure, shortness of Breath

EXAM:
PORTABLE CHEST - 1 VIEW

[portable]
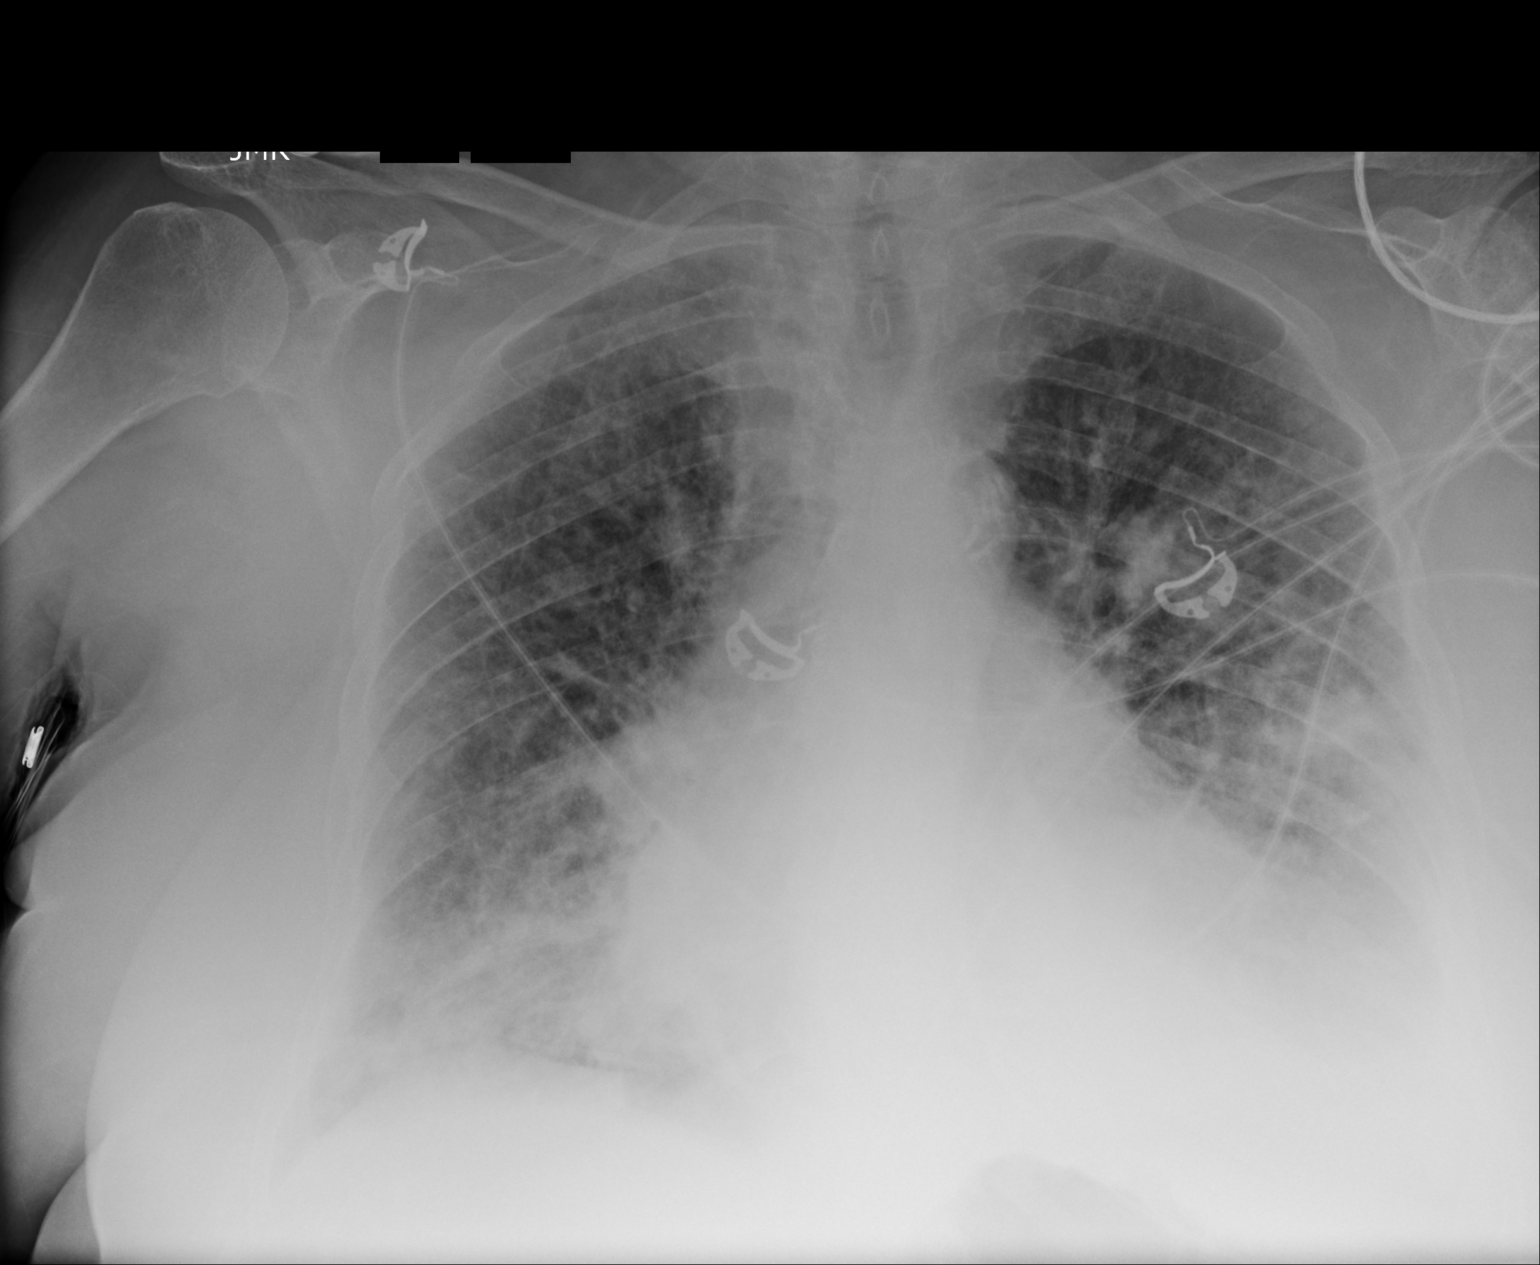

[1 of 1 positions shown; findings below may reference images not displayed]

FINDINGS: Borderline cardiomegaly. Central mild vascular congestion. Worsening
mild interstitial edema bilaterally. Persistent bilateral basilar
atelectasis or infiltrate.
IMPRESSION: Worsening mild interstitial edema. Cardiomegaly. Persistent
bilateral basilar atelectasis or infiltrate.

## 2016-04-18 IMAGING — CR DG CHEST 1V PORT
1 series · 1 of 1 positions shown · non-contrast
Comparison: 11/15/2014

CLINICAL DATA: Acute respiratory failure

EXAM:
PORTABLE CHEST - 1 VIEW

[AP]
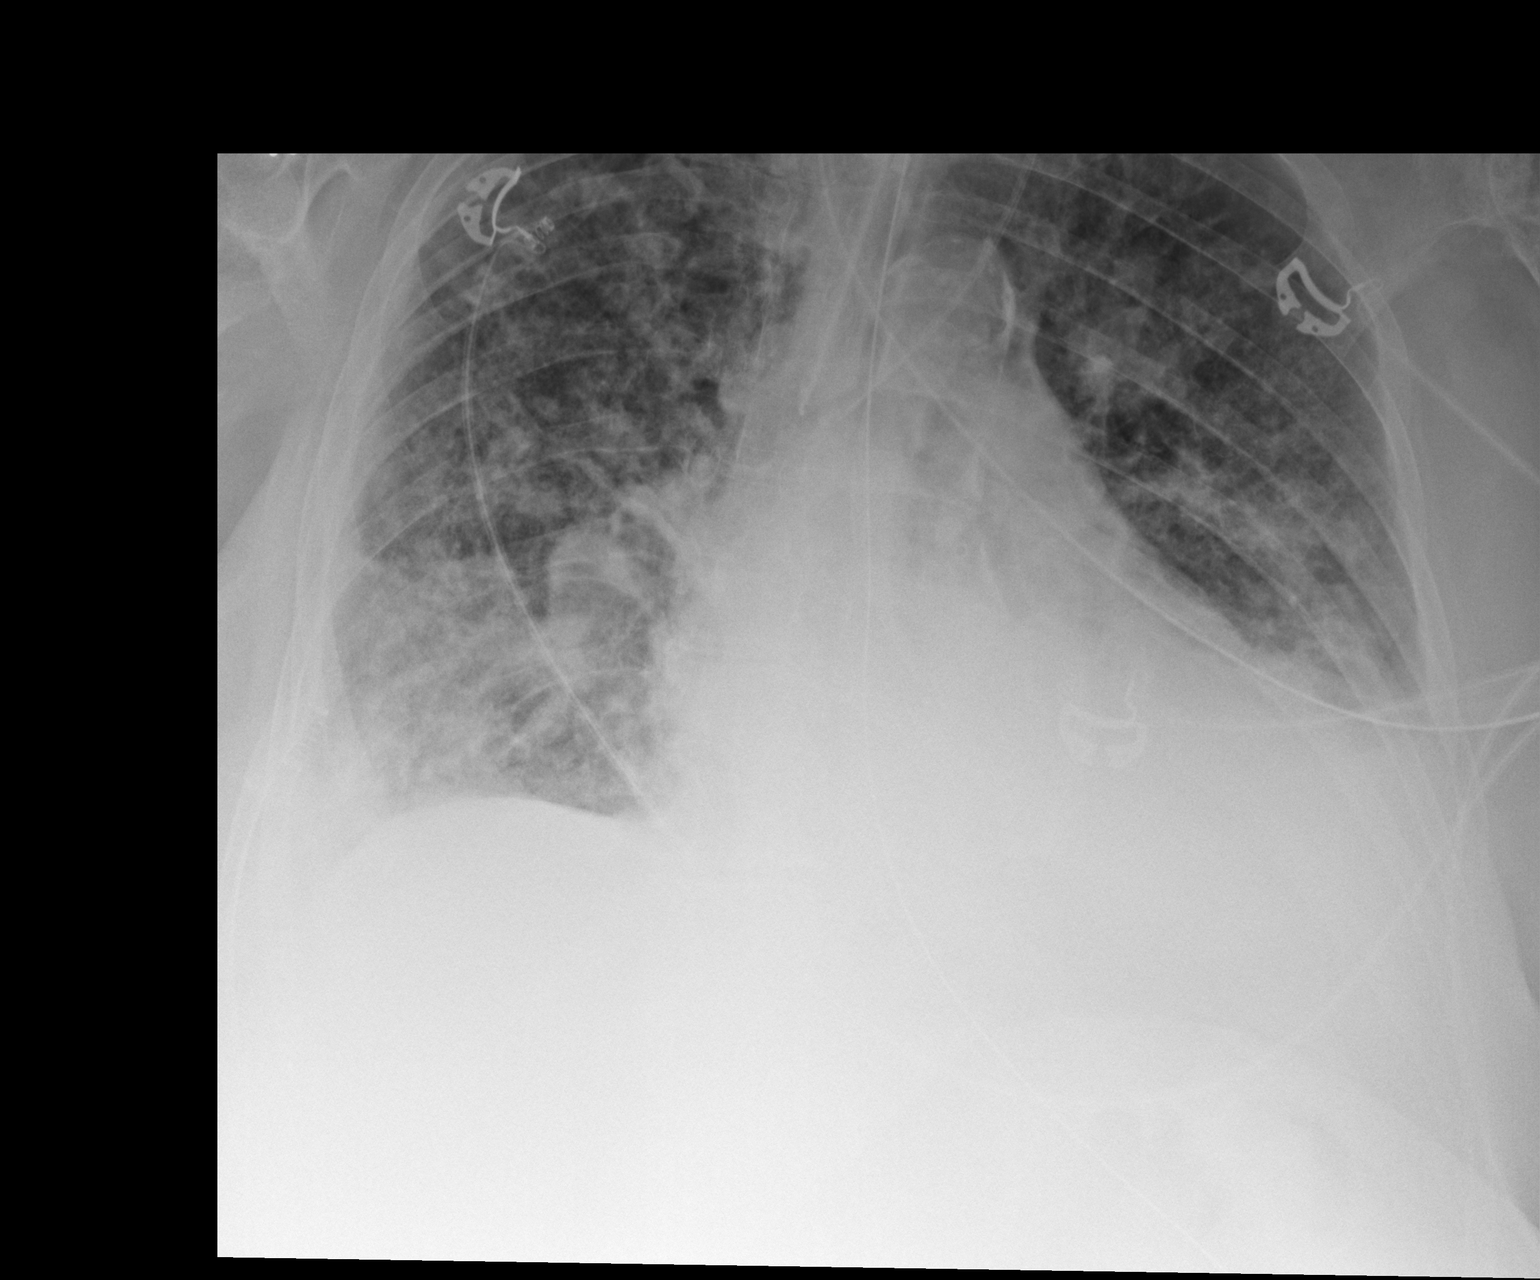

[1 of 1 positions shown; findings below may reference images not displayed]

FINDINGS: Cardiac shadow is stable. The endotracheal tube has advanced and now
lies at the carina directed towards right mainstem bronchus. This
should be withdrawn 2-3 cm. A left jugular central line is again
noted in the left innominate vein. A nasogastric catheter seen
within the stomach. Persistent left basilar infiltrate with
associated effusion is noted. Patchy bilateral opacities are noted
consistent with vascular congestion and mild pulmonary edema.
IMPRESSION: Increasing pulmonary edema.

Persistent left basilar changes are seen.

An endotracheal tube at the carina directed towards right mainstem
bronchus. This should be withdrawn 2-3 cm.

These results will be called to the ordering clinician or
representative by the Radiologist Assistant, and communication
documented in the PACS or zVision Dashboard.
